# Patient Record
Sex: Female | Born: 1940 | Race: White | Hispanic: No | Marital: Married | Smoking: Never smoker
Health system: Southern US, Community
[De-identification: ages and names within clinical notes are randomized; demographics above are authoritative.]

## PROBLEM LIST (undated history)

## (undated) DIAGNOSIS — I219 Acute myocardial infarction, unspecified: Secondary | ICD-10-CM

## (undated) DIAGNOSIS — E785 Hyperlipidemia, unspecified: Secondary | ICD-10-CM

## (undated) DIAGNOSIS — R519 Headache, unspecified: Secondary | ICD-10-CM

## (undated) DIAGNOSIS — K219 Gastro-esophageal reflux disease without esophagitis: Secondary | ICD-10-CM

## (undated) DIAGNOSIS — Z8719 Personal history of other diseases of the digestive system: Secondary | ICD-10-CM

## (undated) DIAGNOSIS — R51 Headache: Secondary | ICD-10-CM

## (undated) DIAGNOSIS — I639 Cerebral infarction, unspecified: Secondary | ICD-10-CM

## (undated) DIAGNOSIS — I2510 Atherosclerotic heart disease of native coronary artery without angina pectoris: Secondary | ICD-10-CM

## (undated) HISTORY — DX: Atherosclerotic heart disease of native coronary artery without angina pectoris: I25.10

## (undated) HISTORY — DX: Hyperlipidemia, unspecified: E78.5

---

## 1997-09-23 ENCOUNTER — Emergency Department (HOSPITAL_COMMUNITY): Admission: EM | Admit: 1997-09-23 | Discharge: 1997-09-23 | Payer: Self-pay | Admitting: Emergency Medicine

## 1999-01-26 HISTORY — PX: CORONARY ARTERY BYPASS GRAFT: SHX141

## 1999-01-26 HISTORY — PX: CARDIAC CATHETERIZATION: SHX172

## 1999-12-26 DIAGNOSIS — I219 Acute myocardial infarction, unspecified: Secondary | ICD-10-CM

## 1999-12-26 HISTORY — DX: Acute myocardial infarction, unspecified: I21.9

## 2000-01-13 ENCOUNTER — Encounter: Payer: Self-pay | Admitting: Emergency Medicine

## 2000-01-13 ENCOUNTER — Inpatient Hospital Stay (HOSPITAL_COMMUNITY): Admission: EM | Admit: 2000-01-13 | Discharge: 2000-01-22 | Payer: Self-pay | Admitting: Emergency Medicine

## 2000-01-15 ENCOUNTER — Encounter: Payer: Self-pay | Admitting: Thoracic Surgery (Cardiothoracic Vascular Surgery)

## 2000-01-16 ENCOUNTER — Encounter: Payer: Self-pay | Admitting: Thoracic Surgery (Cardiothoracic Vascular Surgery)

## 2000-01-17 ENCOUNTER — Encounter: Payer: Self-pay | Admitting: Thoracic Surgery (Cardiothoracic Vascular Surgery)

## 2000-01-18 ENCOUNTER — Encounter: Payer: Self-pay | Admitting: Thoracic Surgery (Cardiothoracic Vascular Surgery)

## 2000-01-20 ENCOUNTER — Encounter: Payer: Self-pay | Admitting: Thoracic Surgery (Cardiothoracic Vascular Surgery)

## 2000-02-16 ENCOUNTER — Encounter (HOSPITAL_COMMUNITY): Admission: RE | Admit: 2000-02-16 | Discharge: 2000-04-29 | Payer: Self-pay | Admitting: Interventional Cardiology

## 2005-05-26 ENCOUNTER — Encounter: Payer: Self-pay | Admitting: Neurosurgery

## 2008-05-30 ENCOUNTER — Emergency Department (HOSPITAL_COMMUNITY): Admission: EM | Admit: 2008-05-30 | Discharge: 2008-05-30 | Payer: Self-pay | Admitting: Emergency Medicine

## 2010-04-09 ENCOUNTER — Inpatient Hospital Stay (INDEPENDENT_AMBULATORY_CARE_PROVIDER_SITE_OTHER)
Admission: RE | Admit: 2010-04-09 | Discharge: 2010-04-09 | Disposition: A | Discharge status: Home / Self Care | Payer: Medicare PPO | Source: Ambulatory Visit | Attending: Family Medicine | Admitting: Family Medicine

## 2010-04-09 ENCOUNTER — Ambulatory Visit (INDEPENDENT_AMBULATORY_CARE_PROVIDER_SITE_OTHER): Payer: Medicare PPO

## 2010-04-09 DIAGNOSIS — S42409A Unspecified fracture of lower end of unspecified humerus, initial encounter for closed fracture: Secondary | ICD-10-CM

## 2010-04-11 ENCOUNTER — Emergency Department (HOSPITAL_COMMUNITY): Payer: Medicare PPO

## 2010-04-11 ENCOUNTER — Ambulatory Visit (INDEPENDENT_AMBULATORY_CARE_PROVIDER_SITE_OTHER): Payer: Medicare PPO

## 2010-04-11 ENCOUNTER — Emergency Department (HOSPITAL_COMMUNITY)
Admission: EM | Admit: 2010-04-11 | Discharge: 2010-04-11 | Disposition: A | Discharge status: Home / Self Care | Payer: Medicare PPO | Attending: Emergency Medicine | Admitting: Emergency Medicine

## 2010-04-11 ENCOUNTER — Inpatient Hospital Stay (INDEPENDENT_AMBULATORY_CARE_PROVIDER_SITE_OTHER)
Admission: RE | Admit: 2010-04-11 | Discharge: 2010-04-11 | Disposition: A | Discharge status: Home / Self Care | Payer: Medicare PPO | Source: Ambulatory Visit | Attending: Family Medicine | Admitting: Family Medicine

## 2010-04-11 DIAGNOSIS — Z79899 Other long term (current) drug therapy: Secondary | ICD-10-CM | POA: Insufficient documentation

## 2010-04-11 DIAGNOSIS — M25419 Effusion, unspecified shoulder: Secondary | ICD-10-CM | POA: Insufficient documentation

## 2010-04-11 DIAGNOSIS — W11XXXA Fall on and from ladder, initial encounter: Secondary | ICD-10-CM | POA: Insufficient documentation

## 2010-04-11 DIAGNOSIS — S42213A Unspecified displaced fracture of surgical neck of unspecified humerus, initial encounter for closed fracture: Secondary | ICD-10-CM | POA: Insufficient documentation

## 2010-04-11 DIAGNOSIS — I2510 Atherosclerotic heart disease of native coronary artery without angina pectoris: Secondary | ICD-10-CM | POA: Insufficient documentation

## 2010-04-11 DIAGNOSIS — M25519 Pain in unspecified shoulder: Secondary | ICD-10-CM | POA: Insufficient documentation

## 2010-04-11 DIAGNOSIS — S42209B Unspecified fracture of upper end of unspecified humerus, initial encounter for open fracture: Secondary | ICD-10-CM

## 2010-04-11 LAB — DIFFERENTIAL
Eosinophils Absolute: 0 10*3/uL (ref 0.0–0.7)
Eosinophils Relative: 0 % (ref 0–5)
Lymphocytes Relative: 23 % (ref 12–46)
Lymphs Abs: 2.1 10*3/uL (ref 0.7–4.0)
Monocytes Absolute: 0.6 10*3/uL (ref 0.1–1.0)
Monocytes Relative: 7 % (ref 3–12)

## 2010-04-11 LAB — BASIC METABOLIC PANEL
BUN: 13 mg/dL (ref 6–23)
Chloride: 99 mEq/L (ref 96–112)
Creatinine, Ser: 0.59 mg/dL (ref 0.4–1.2)
GFR calc Af Amer: >60 mL/min (ref >60)
GFR calc non Af Amer: >60 mL/min (ref >60)
Potassium: 3.6 mEq/L (ref 3.5–5.1)

## 2010-04-11 LAB — CBC
HCT: 36 % (ref 36.0–46.0)
MCH: 30.3 pg (ref 26.0–34.0)
MCV: 89.6 fL (ref 78.0–100.0)
Platelets: 205 10*3/uL (ref 150–400)
RDW: 13.4 % (ref 11.5–15.5)

## 2010-04-11 LAB — POCT CARDIAC MARKERS
CKMB, poc: 1.5 ng/mL (ref 1.0–8.0)
Troponin i, poc: <0.05 ng/mL (ref 0.00–0.09)

## 2010-04-11 LAB — APTT: aPTT: 27 seconds (ref 24–37)

## 2010-06-12 NOTE — Op Note (Signed)
[place]. [facility]  Patient:    Debra Fry, Debra Fry                         MRN: [id number] Proc. Date: 01/15/00 Adm. Date:  [id number] Attending:  Charlett Lango CC:         Darci Needle, M.D.   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  POSTOPERATIVE DIAGNOSIS:  Severe two vessel coronary disease status post non-Q wave myocardial infarction.  OPERATION:  Median sternotomy, extracorporal circulation, coronary artery bypass grafting x 2 (left internal mammary to second obtuse marginal, right internal mammary artery to distal right coronary artery).  SURGEON:  Salvatore Decent. Dorris Fetch, M.D.  ASSISTANT: Sherrie George, P.A.  ANESTHESIA:  General.  FINDINGS:  Good quality targets, good quality conduits, normal left ventricular size and function.  INDICATIONS:  Ms. Harnack is a 70 year old female who presented with unstable angina and ruled in for a myocardial infarction.  She underwent cardiac catheterization which revealed 99% stenosis in her mid RCA.  She also had a long 80% stenosis at the ostium of her left circumflex which was not amenable to angioplasty.  The patient was therefore referred for coronary artery bypass grafting.  The indications, risks, benefits and alternatives were discussed in detail with the patient.  She understood and accepted the risks and agreed to proceed.  DESCRIPTION OF PROCEDURE:  The patient was taken to the preoperative holding area on January 15, 2000.  Lines were placed to monitor arterial, central, venous, pulmonary and arterial pressure.  Electrocardiogram leads were placed for continuous telemetry.  The patient was taken to the operating room, anesthetized and intubated.  A Foley catheter was placed.  Intravenous antibiotics were administered.  The chest, abdomen and legs were prepped and draped in the usual fashion.  A medium  sternotomy was performed, and the internal mammary arteries were taken down using standard techniques.  Large branches were doubly clipped and divided with scissors.  Small branches were clipped on the mammary side and cauterized on the chest wall side.  The patient was given 5000 units before dividing the first mammary artery and the remainder of the full heparin dose before dividing the second.  Both mammary arteries were 2 mm in diameter and both had excellent flow.  The pericardium was opened.  The ascending aorta was inspected.  There was no palpable atherosclerotic disease.  It was of normal size.  The aorta was cannulated via concentric 2-0 Ethibond nonpledgeted pursestring sutures.  A dual stage venous cannula was placed via pursestring suture in the right atrial appendage.  Cardiopulmonary bypass was instituted and the patient was cooled to 32 degrees Celsius.  The coronary arteries were inspected and anastomotic sites were chosen.  The conduits were inspected and cut to length. A foam pad was placed in the pericardium to protect the phrenic nerve. A temperature probe was placed in the myocardial septum, and a cardioplegia cannula was placed in the ascending aorta.  The aorta was cross-clamped.  The left ventricle was emptied the aortic root and then cardiac arrest was achieved with a combination of topical iced saline and cold antegrade cardioplegia.  After achieving a complete diastolic arrest and the myocardial temperature was less than 10 degrees Celsius, the following distal anastomosis were performed.  First, the left internal mammary artery was brought through a window in the pericardium.  The distal end of  the mammary was spatulated.  It was anastomosis end-to-side to the obtuse marginal II with just the largest branch of the left circumflex coronary artery which had an 80-85% long proximal ostial stenosis.  The obtuse marginal II was a good quality target.  The mammary  was a good quality conduit.  The anastomosis was performed end-to-side with a running 8-0 Prolene suture.  At the completion of the anastomosis, the pedicle clamp was briefly removed to inspect for hemostasis.  There was good hemostasis at the anastomosis.  The bulldog pedicle was replaced.  The mammary pedicle was tacked to the epicardial surface of the heart with 6-0 Prolene sutures.  Of note, the mammary reached easily and without tension to OM-II but was grafted relatively high in the AV groove because it became very small further out.  Next, additional cardioplegia was administered down the aortic root once again.  Then, the right internal mammary artery was brought through a window on the right side of the pericardium.  The distal end of the right mammary was spatulated and was anastomosis end-to-side to the distal right coronary.  The distal right coronary was a 1.8 mm good quality target.  The right mammary was a 2 mm good quality conduit.  The anastomosis was performed end-to-side with a running 8-0 Prolene suture.  At the completion of the anastomosis, the bulldog clamp was removed.  The anastomosis had been probed proximally and distally with a 1.5 mm probe prior to tying the suture.  At the completion of the anastomosis, the bulldog clamp was removed.  A good flash of blood was seen distally in the right coronary.  There was some bleeding from the toe which was repaired with a single 8-0 Prolene suture.  The mammary pedicle was tacked to the epicardial surface of the heart.  The aortic cross-clamp was removed, and the total cross-clamp time was 33 minutes.  Lidocaine was administered, and the patient resumed sinus rhythm spontaneously.  No defibrillations were required.  The distal anastomoses were inspected for hemostasis.  The patient was being rewarmed during the performance of the RIMA to RCA anastomosis.  The epicardial pacing wires were placed on the right atrium and the  right ventricle.  The patient was weaned from cardiopulmonary bypass when the core  temperature reached 37 degrees Celsius.  She was atrially paced for rate at the time of separation from bypass.  The total bypass time was 71 minutes. The patient did require two units of packed red blood cells while on bypass for anemia.  The initial cardiac index was greater than 2 liters per minute per m sq.  The test dose of Protamine was administered without hemodynamic compromise.  The atrial and aortic cannulae were removed.  There was good hemostasis at both cannulation sites.  The cardioplegia needle was removed, and the suture pulled through the aorta in one spot requiring a second 4-0 Prolene suture to achieve initial hemostasis and a 4-0 Prolene horizontal mattress suture with pericardial pledgets to reinforce the repair.  There was good hemostasis with this site as well.  The remainder of the Protamine was administered without incident, and an additional dose of antibiotics was given.  The chest was irrigated with one liter of warm normal saline.  Hemostasis was achieved. Bilateral pleural and two mediastinal chest tubes were placed through separate subcostal incisions.  The sternum was closed with heavy gauged interrupted stainless steel wires.  The pectoralis fascia was closed with a running #1 Vicryl suture.  The subcutaneous tissue was closed with a running 2-0 Vicryl suture, and the skin was closed with a 3-0 Vicryl subcuticular suture.  All sponge, needle and instrument counts were correct at the end of the procedure. There were no intraoperative complications.  The patient was taken from the operating room to the surgical intensive care unit, intubated and in stable condition. DD:  01/15/00 TD:  01/17/00 Job: [id number]

## 2010-06-12 NOTE — H&P (Signed)
[place]. [facility]  Patient:    Debra Fry, Debra Fry                         MRN: [id number] Adm. Date:  [id number] Attending:  Lyn Records. Iii Dictator:   Anselm Lis, N.P. CC:         Carola J. Gerri Spore, M.D., [facility]   History and Physical  DATE OF BIRTH: February 05, 1940  CHIEF COMPLAINT: [name] is a pleasant 70 year old female without prior cardiac history or significant risk factors for coronary artery disease.  She does has a history of ulcers and of hiatal hernia with postprandial regurgitation for many years.  She had a stress Cardiolite study done in 1996 by Dr. Nicki Guadalajara for atypical chest pain which was negative for ischemia, with a good ejection fraction.  Four days prior to admission the patient developed malaise, two days earlier having developed left subscapular sharp "knife-like" discomfort that was almost continuous and described as severe, with radiation to her left anterior chest and associated with left arm aching. She had no associated diaphoresis, shortness of breath, or nausea.  The pain seemed to markedly exacerbate with ambulating/exertion.  It was severe when walking at the mall yesterday.  The discomfort was relieved when lying perfectly still.  She can have precipitation of this with leaning forward. The discomfort is nonpleuritic and nonmusculoskeletal.   She reported to [facility] for further evaluation, where EKG revealed new (compared to EKG obtained in 1996) inferior T wave inversions.  She was triaged at the [facility]. [facility] and ambulating to the stretcher again developed severe left subscapular pain with radiation to her left anterior chest and with left arm aching.  She also felt dizzy. Currently she is pain-free.  EKG reveals continued inferior T wave abnormalities with lateral T wave flattening.  CARDIAC RISK FACTORS: Age.  PAST MEDICAL  HISTORY:  1. GERD.  2. Hiatal hernia with postprandial reflux, with prior extensive GI work-up     in the past per the patients family.  3. Peptic ulcer disease.  PAST SURGICAL HISTORY: Negative.  ALLERGIES: No known drug allergies.  MEDICATIONS: Prevacid, uncertain dosage, q.d.  SOCIAL HISTORY: Negative tobacco and ETOH use.  Caffeine use not excessive. The patient has been married for 30+ years and has two daughters and a son, who are alive and well.  She has worked at a [employer], which is going out of business this Friday.  She worked there for 31 years and has been very distressed and depressed over the last three months about this job coming to an end.  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  FAMILY HISTORY: Her father died of a heart attack late in life.  Her mother died at the age of 97 and had diabetes mellitus and peripheral vascular disease.  REVIEW OF SYSTEMS: The patient denies dysphagia but problems with GERD manifested as burning, without significant relief on Proton pump inhibitors. She has had problems with constipation.  She denies melena and diarrhea. Negative history for bright red blood per rectum.  She denies dysuria or hematuria.  Negative pedal edema, orthopnea, or PND.  Rare nonsymptomatic palpitations.  She has complained of abdominal swelling and bloating over the last year, which is quite concerning to her.  Again, she has postprandial regurgitation/emesis occurring at least once daily.  She has been told she needs surgery  for this is recalcitrant because of probable side effect of continuous bloating.  PHYSICAL EXAMINATION (as performed by Dr. Katrinka Blazing):  VITAL SIGNS: Blood pressure 155/82, heart rate 62 and regular.  She is afebrile.  GENERAL: She is a well-nourished middle-aged female in no acute distress.  Her husband and daughter are at her bedside.  HEENT: There are bilateral carotid upstrokes without bruits.  NECK: No  JVD, no thyromegaly.  CHEST: Lung sounds clear with equal bilateral excursions.  CARDIAC: Regular rate and rhythm without murmurs, rubs, or gallops.  Normal S1 and S2.  ABDOMEN: Soft, nondistended.  Normoactive bowel sounds.  Negative abdominal aortic, renal, or femoral bruit.  No masses, no organomegaly appreciated.  EXTREMITIES: Symmetrical bilateral pulses +2/4, negative pedal edema.  NEUROLOGIC: Cranial nerves 2-12 grossly intact.  Nonfocal.  Alert and oriented x 3.  GU/RECTAL: Examination deferred.  LABORATORY DATA: EKG from the primary physicians office revealed normal sinus rhythm at 78 beats per minute with T wave inversion inferiorly, T wave flattening in V3 through V6.  Follow-up EKG in the ER revealed improvement in T wave abnormalities.  Cardiolite study from 1996 revealed normal perfusion, normal dynamics.  WBC 6.3, hemoglobin 13.7, hematocrit 37.3, platelets 257,000.  Sodium 136, potassium 3.8, chloride 104, CO2 25, glucose 109, BUN 17, creatinine 0.8. LFTs were within normal range.  CK 202 with MB fraction pending.  Troponin I is pending.  IMPRESSION (as dictated by Dr. Katrinka Blazing):  1. Chest pain, with some typical/atypical features for coronary artery     disease as etiology.  Inferior T wave abnormalities earlier today have     improved but are new compared to the electrocardiogram done in 1996.  2. Gastroesophageal reflux disease/postprandial reflux, on Prevacid.  Per     the patient and her husband she has had extensive gastrointestinal     work-up in the past.  PLAN (as dictated per Dr. Katrinka Blazing):  1. Admit to telemetry for rule out MI protocol with serial cardiac enzymes     daily and EKGs.  Question cardiac catheterization.  2. Continue Proton pump inhibitor.  3. SubQ Lovenox, aspirin, and nitrates. DD:  01/13/00 TD:  01/14/00 Job: [id number]

## 2010-06-12 NOTE — Cardiovascular Report (Signed)
[place]. [facility]  Patient:    Debra Fry, Debra Fry                         MRN: [id number] Proc. Date: 01/14/00 Adm. Date:  [id number] Attending:  Charlett Lango CC:         Carola J. Gerri Spore, M.D., [facility]  Ulyess Mort, M.D. [facility]  CVTS   Cardiac Catheterization  DATE OF BIRTH: [birth year]  INDICATIONS FOR PROCEDURE: Unstable angina/non-Q-wave myocardial infarction. PROCEDURES PERFORMED: 1. Left heart catheterization. 2. Selective coronary angiography. 3. Left ventriculography.  DESCRIPTION OF PROCEDURE: After informed consent, a 6 French sheath was placed in the right femoral artery using a modified Seldinger technique.  A 6 French A2 multipurpose catheter was used for hemodynamic recordings, left ventriculography, and selective right and left coronary angiography.  A #4 right Judkins catheter, 6 [place] was also used for right coronary angiography. The patient tolerated the procedure without complications.  After reviewing the digital images in the catheterization suite, it was felt that coronary artery bypass surgery would be the preferable treatment rather than PCI, because of high-grade stenosis in the ostium of the circumflex of the left main, that would preclude good percutaneous management.  The right coronary was then culprit vessel and was intimately treatable with PCI, but this would only be a partial solution to her coronary disease, anatomical problems at this time.  In the holding area, before the sheath was pulled the patient had a bradyasystolic arrest that responded to a chest thump, coughing, and the administration of atropine.  Very brief chest compressions were necessary. This all occurred in the setting of significant nausea and ultimately vomiting.  The patient responded briskly to the atropine and the sheath was later removed.  RESULTS:  I:  HEMODYNAMIC DATA:     a. The aortic pressure  was 122/62 mmHg.     b. Left ventricular pressure 126/20 mmHg.  II:  LEFT VENTRICULOGRAPHY:  The left ventricle is normal in size and demonstrates normal overall contractility.  EF greater than 60%.  III:  SELECTIVE CORONARY ANGIOGRAPHY:     a. Left main: The left main coronary artery is normal.     b. Left anterior descending coronary artery: The LAD is a large        vessel that reaches the left ventricular apex.  It gives origin to        two large diagonal branches that unfortunately, during the procedure,        were never laid out in the LAO cranial and AP cranial views.        There dose not appear to be any significant obstruction in the LAD,        although there is mild irregularity in the proximal LAD before the        first septal perforator.  Both the large diagonal branches are widely        patent.    c. Circumflex artery: The circumflex artery is a large vessel that gives        origin to two large obtuse marginal branches and contains an        ostial 85% stenosis right at the left main that is segmental.     d. Right coronary artery: The right coronary artery is a dominant vessel        giving origin to a large PDA and two left ventricular branches.  There  is a 50% ostial narrowing in the right coronary and there is 99%        stenosis in the mid RCA. Flow in the RCA is TIMI grade 3.  CONCLUSIONS: 1. Severe two-vessel coronary artery disease with an ostial circumflex and    a severe thrombus containing mid right coronary stenosis.  The right    coronary is the culprit vessel.  The circumflex, however, presents problems    for percutaneous therapy, and because its proximity to the left main, it    would appear that bypass surgery would be her most appropriate treatment. 2. Left ventricular function is normal. 3. Bradyasystolic arrest in the recovery area, treated with atropine and    precordial thump.  PLAN: Continue Integrilin.  CVTS to see, hopeful surgery  in the next 24 hours or so.  Will be happy to re-angiogram the left coronary if there are any issues about the patency of this vessel, although I do not feel that any significant stenosis is present. DD:  01/14/00 TD:  01/15/00 Job: [id number]

## 2010-06-12 NOTE — Discharge Summary (Signed)
[place]  Patient:    Debra Fry, Debra Fry                         MRN: [id number] Adm. Date:  [id number] Disc. Date: 01/22/00 Attending:  Charlett Lango Dictator:   Sherrie George, P.A. CC:         Carola J. Gerri Spore, M.D. [facility]  Celso Sickle, M.D.   Discharge Summary  DATE OF BIRTH:  July 17, 1940  ADMISSION DIAGNOSES: 1. Atypical chest pain. 2. Gastroesophageal reflux disease.  DISCHARGE DIAGNOSES: 1. Unstable angina with non-Q-wave myocardial infarction, ejection fraction    of 65%, with severe two-vessel coronary artery disease. 2. Status post bradycardic arrest during cardiac catheterization. 3. Postoperative anemia. 4. Gastroesophageal reflux disease.  PROCEDURE: 1. Cardiac catheterization on January 14, 2000, Dr. Darci Needle III. 2. Coronary artery bypass grafting x 2, with the left internal mammary    artery to the second obtuse marginal, and right internal mammary artery    to the right coronary artery on January 15, 2000, Dr. Viviann Spare C.    Hendrickson.  HISTORY OF PRESENT ILLNESS:  The patient is a 70 year old white female, a medical patient of Dr. Otilio Connors. 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  The patient is a 70 year old white female, who was admitted with a prior history of atypical chest pain.  She underwent a Cardiolyte in 1996, which was negative.  Four days prior to admission she developed malaise.  Two days earlier she had developed some left subscapular sharp knife-like discomfort that was almost continuous, and described as severe, with radiation to her left anterior chest, associated with left arm aching.  She had no associated diaphoresis, shortness of breath, or nausea.  The pain seems to markedly exacerbate with ambulating and exertion.  It was severe when walking at the mall yesterday.  The discomfort is resolved by lying still.   She subsequently underwent an evaluation at Dr. Rhona Leavens office.  An electrocardiogram revealed new changes, compared to an electrocardiogram done in 1996, with inferior T-wave inversions.  She was sent to the [facility] Emergency Room, and at this time she was admitted for further treatment, rule out an myocardial infarction.  PAST MEDICAL HISTORY: 1. Gastroesophageal reflux disease. 2. Hiatal hernia with postprandial reflux, with prior extensive GI workup. 3. History of peptic ulcer disease.  ALLERGIES:  No known drug allergies.  CURRENT MEDICATIONS:  Prevacid, dose uncertain.  SOCIAL HISTORY:  She has never used tobacco or alcohol.  She does not use excessive caffeine.  She is married for 30 years.  Her husband is a heavy smoker.  She has two daughters and a son.  For further history and physical, please see the dictated note.  HOSPITAL COURSE:  The patient was admitted and underwent routine studies, including CPK, MB, and troponins, which were positive.  After reviewing the studies, she was placed on IV heparin and IV Integrilin and IV nitroglycerin. She remained stable, and was taken to the cardiac catheterization laboratory on January 14, 2000, by Dr. Darci Needle III.  This showed a normal ejection fraction of 65%.  The coronary arteriogram showed a normal left main and LAD.  The circumflex had an 80% ostial stenosis, and the right coronary artery had a 90%+ midstenosis, which was thought to be the patients culprit lesion.  Additionally during this procedure, she developed a brady asystolic arrest, and  was treated with a chest pump and IV atropine in the recovery area.  After the completion of the study, it was Dr. Lonn Georgia opinion that she had severe two-vessel disease, and it was not treatable with percutaneous treatments because of the proximity to the left knee, and the inability to stent it.  They felt that the RCA was the culprit lesion, and she  had a normal ejection fraction.  It was their recommendation that she undergo a coronary artery bypass grafting.  She was seen in consultation by Dr. Viviann Spare C. Dorris Fetch, who after reviewing the studies in the chart agrees.  She has severe two-vessel disease.  The left circumflex was not amenable to a PTCA. She had unstable angina, with a brady systolic arrest.  He recommended a coronary artery bypass grafting.  The risks and benefits were discussed.  The patient was agreeable, and was taken to the operating room on the following day on January 15, 2000.  At this time she underwent a coronary artery bypass grafting x 2, with the left internal mammary artery to the second obtuse marginal, and the right internal mammary to the right coronary artery.  She tolerated this well, and returned to the intensive care unit in satisfactory condition.  LABORATORY DATA:  Postoperatively the cardiac index was 2.3.  The platelets were a little low, but overall the patient remained stable.  On the first postoperative morning she was lethargic, but saturations were 100% on 2 L nasal cannula.  The blood pressure was 100/57.  The cardiac index was 3.5. Hemoglobin 9, hematocrit 27, white count 9.6, platelets 142,000.  Electrolytes were normal.  BUN 12, creatinine 0.7, glucose 189.  It was Dr. Viviann Spare C. Hendricksons opinion that overall she was doing well, and stable from the cardiac standpoint.  She was diuresed and a pulmonary toilette was initiated.  She continued to do well throughout the day.  On the second postoperative day she was afebrile.  The 2 L nasal cannula showed the saturations of 94%.  The output was good.  The patient had minimal drainage from her chest tubes.  The pleural tubes were removed.  We continued the diuresis and a pulmonary toilette.  The patient was then transferred to 2000. On the third postoperative day, the patient was stable.  She had a right effusion and some atelectasis,  and was requiring O2.  Cardiac rehabilitation  was initiated, and the patient remained in essentially a sinus rhythm since her surgery.  She has continued to do well.  Her biggest problem has been O2 dependence.  By January 21, 2000, the patients saturations were improving.  After walking her saturations did drop down to 82%, but after resting for one to two minutes her saturations came up to 92% on room air.  We plan to continue her diuresis.  A chest x-ray showed some small effusions.  We will work on those. The patient has remained in a sinus rhythm.  DISCHARGE MEDICATIONS: 1. Resume her Prevacid as before. 2. Lopressor 50 mg 1/2 tablet q.12h. 3. Lasix 40 mg p.o. q.d. x 10 days. 4. Potassium chloride 20 mEq p.o. q.d. x 10 days. 5. Zocor 20 mg p.o. q.h.s. 6. Tylox one to two p.o. q.4h. p.r.n. 7. It is also recommended that the patient start on a multivitamin with iron    one q.d.  FOLLOWUP:  The patient will return in two weeks to see Dr. Katrinka Blazing.  She is to contact Dr. Katrinka Blazing.  She is to return in  three weeks to see Dr. Dorris Fetch on Wednesday, February 10, 2000, at 11:30 a.m.  She is to contact Dr. Gerri Spore for followup in the office.  DISCHARGE  INSTRUCTIONS:  The discharge activity is to be light to moderate. No lifting of over 10 pounds, no driving.  No strenuous activity.  DISCHARGE LABORATORY DATA:  White count 8.6, hematocrit 24.7, platelets 191,000, white count 8.8.  Electrolytes shows sodium 134, potassium 3.9, chloride 100, CO2 of 30, BUN 23, creatinine 0.7, glucose 158.  The patient has had some elevated glucose during her hospitalization.  These have not been specifically addressed.  CONDITION ON DISCHARGE:  Improved. DD:  01/21/00 TD:  01/21/00 Job: [id number]

## 2011-02-26 ENCOUNTER — Other Ambulatory Visit: Payer: Self-pay | Admitting: Internal Medicine

## 2011-02-26 DIAGNOSIS — Z1231 Encounter for screening mammogram for malignant neoplasm of breast: Secondary | ICD-10-CM

## 2011-02-26 DIAGNOSIS — M8580 Other specified disorders of bone density and structure, unspecified site: Secondary | ICD-10-CM

## 2011-03-24 ENCOUNTER — Ambulatory Visit
Admission: RE | Admit: 2011-03-24 | Discharge: 2011-03-24 | Disposition: A | Discharge status: Home / Self Care | Payer: Medicare PPO | Source: Ambulatory Visit | Attending: Internal Medicine | Admitting: Internal Medicine

## 2011-03-24 DIAGNOSIS — Z1231 Encounter for screening mammogram for malignant neoplasm of breast: Secondary | ICD-10-CM

## 2011-03-24 DIAGNOSIS — M8580 Other specified disorders of bone density and structure, unspecified site: Secondary | ICD-10-CM

## 2013-09-29 ENCOUNTER — Encounter: Payer: Self-pay | Admitting: *Deleted

## 2014-08-25 ENCOUNTER — Emergency Department (HOSPITAL_COMMUNITY)
Admission: EM | Admit: 2014-08-25 | Discharge: 2014-08-25 | Disposition: A | Discharge status: Home / Self Care | Payer: No Typology Code available for payment source | Attending: Emergency Medicine | Admitting: Emergency Medicine

## 2014-08-25 ENCOUNTER — Emergency Department (HOSPITAL_COMMUNITY): Payer: No Typology Code available for payment source

## 2014-08-25 ENCOUNTER — Encounter (HOSPITAL_COMMUNITY): Payer: Self-pay | Admitting: *Deleted

## 2014-08-25 DIAGNOSIS — S4992XA Unspecified injury of left shoulder and upper arm, initial encounter: Secondary | ICD-10-CM | POA: Diagnosis not present

## 2014-08-25 DIAGNOSIS — Y92410 Unspecified street and highway as the place of occurrence of the external cause: Secondary | ICD-10-CM | POA: Diagnosis not present

## 2014-08-25 DIAGNOSIS — S134XXA Sprain of ligaments of cervical spine, initial encounter: Secondary | ICD-10-CM | POA: Diagnosis not present

## 2014-08-25 DIAGNOSIS — I2510 Atherosclerotic heart disease of native coronary artery without angina pectoris: Secondary | ICD-10-CM | POA: Diagnosis not present

## 2014-08-25 DIAGNOSIS — Y998 Other external cause status: Secondary | ICD-10-CM | POA: Diagnosis not present

## 2014-08-25 DIAGNOSIS — S199XXA Unspecified injury of neck, initial encounter: Secondary | ICD-10-CM | POA: Diagnosis present

## 2014-08-25 DIAGNOSIS — Y9389 Activity, other specified: Secondary | ICD-10-CM | POA: Diagnosis not present

## 2014-08-25 DIAGNOSIS — Z8639 Personal history of other endocrine, nutritional and metabolic disease: Secondary | ICD-10-CM | POA: Insufficient documentation

## 2014-08-25 DIAGNOSIS — Z9861 Coronary angioplasty status: Secondary | ICD-10-CM | POA: Diagnosis not present

## 2014-08-25 DIAGNOSIS — S39012A Strain of muscle, fascia and tendon of lower back, initial encounter: Secondary | ICD-10-CM

## 2014-08-25 DIAGNOSIS — S139XXA Sprain of joints and ligaments of unspecified parts of neck, initial encounter: Secondary | ICD-10-CM

## 2014-08-25 MED ORDER — METHOCARBAMOL 500 MG PO TABS: 500.0000 mg | ORAL_TABLET | Freq: Two times a day (BID) | ORAL | Status: DC

## 2014-08-25 MED ORDER — NAPROXEN 500 MG PO TABS: 500.0000 mg | ORAL_TABLET | Freq: Two times a day (BID) | ORAL | Status: DC
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
## 2015-11-01 ENCOUNTER — Ambulatory Visit (HOSPITAL_COMMUNITY)
Admission: EM | Admit: 2015-11-01 | Discharge: 2015-11-01 | Disposition: A | Discharge status: Home / Self Care | Payer: Medicare HMO | Attending: Internal Medicine | Admitting: Internal Medicine

## 2015-11-01 ENCOUNTER — Encounter (HOSPITAL_COMMUNITY): Payer: Self-pay | Admitting: Emergency Medicine

## 2015-11-01 DIAGNOSIS — M5442 Lumbago with sciatica, left side: Secondary | ICD-10-CM | POA: Diagnosis not present

## 2015-11-01 MED ORDER — NAPROXEN 500 MG PO TABS: 500.0000 mg | ORAL_TABLET | Freq: Two times a day (BID) | ORAL | 0 refills | Status: DC

## 2015-11-01 MED ORDER — PREDNISONE 50 MG PO TABS: 50.0000 mg | ORAL_TABLET | Freq: Every day | ORAL | 0 refills | Status: AC

## 2015-11-01 NOTE — Discharge Instructions (Addendum)
Activity as tolerated.  Prescriptions for prednisone (steroid) and naproxen (anti inflammatory) were sent to the [place] at [facility].  Ice on your low back for a few minutes a couple times daily may be helpful in decreasing pain/inflammation.   If pain in back and down left leg persists, please followup with a spine specialist for further evaluation.

## 2015-11-01 NOTE — ED Triage Notes (Signed)
Patient complains of left leg, hip, and lower back pain.  No known injury.  Pain for 2 weeks.

## 2015-11-01 NOTE — ED Provider Notes (Signed)
[facility]    CSN: [id number] Arrival date & time: 11/01/15  1609     History   Chief Complaint Pain--leg  HPI Debra Fry is a 75 y.o. female. She presents today with a two-week history of pain radiating down the left lateral aspect of her leg. She has some discomfort in her left upper buttock as well. No leg weakness/clumsiness, no change in bowel or bladder function. Tends to be a little bit constipated.  Pain is more noticeable when she is moving around, cleaning, and also wakes her at night. No falls. She was able to walk into the urgent care independently.  HPI  Past Medical History:  Diagnosis Date  . Coronary artery disease   . Hyperlipidemia     Past Surgical History:  Procedure Laterality Date  . CORONARY ARTERY BYPASS GRAFT       Home Medications    Prior to Admission medications   Medication Sig Start Date End Date Taking? Authorizing Provider  naproxen (NAPROSYN) 500 MG tablet Take 1 tablet (500 mg total) by mouth 2 (two) times daily. 11/01/15   Sherlene Shams, MD  predniSONE (DELTASONE) 50 MG tablet Take 1 tablet (50 mg total) by mouth daily. 11/01/15 11/06/15  Sherlene Shams, MD  Tetrahydrozoline HCl (VISINE OP) Apply 1-2 drops to eye daily as needed (dry eyes).    Historical Provider, MD    Family History Family History  Problem Relation Age of Onset  . Family history unknown: Yes    Social History Social History  Substance Use Topics  . Smoking status: Never Smoker  . Smokeless tobacco: Not on file  . Alcohol use No     Allergies   Review of patient's allergies indicates no known allergies.   Review of Systems Review of Systems  All other systems reviewed and are negative.    Physical Exam Triage Vital Signs ED Triage Vitals [11/01/15 1756]  Enc Vitals Group     BP 157/75     Pulse Rate 70     Resp 18     Temp 98 F (36.7 C)     Temp Source Oral     SpO2 99 %     Weight 138 lb (62.6 kg)     Height 5\' 2"  (1.575 m)     Updated Vital Signs BP 157/75 (BP Location: Left Arm)   Pulse 70   Temp 98 F (36.7 C) (Oral)   Resp 18   Ht 5\' 2"  (1.575 m)   Wt 138 lb (62.6 kg)   SpO2 99%   BMI 25.24 kg/m  Physical Exam  Constitutional: She is oriented to person, place, and time. No distress.  Alert, nicely groomed  HENT:  Head: Atraumatic.  Eyes:  Conjugate gaze, no eye redness/drainage  Neck: Neck supple.  Cardiovascular: Normal rate.   Pulmonary/Chest: No respiratory distress.  Abdominal: She exhibits no distension.  Musculoskeletal: Normal range of motion.  No leg swelling No tenderness to palpation in the low back, no focal swelling Strength in the bilateral lower extremities, proximal and distal, is 5/5 and symmetric Moves legs easily  Neurological: She is alert and oriented to person, place, and time.  Able to walk into the urgent care independently, climb on/off the exam table without difficulty  Skin: Skin is warm and dry.  No cyanosis  Nursing note and vitals reviewed.    UC Treatments / Results   Procedures Procedures (including critical care time)  None today  Final Clinical Impressions(s) / UC Diagnoses   Final diagnoses:  Acute left-sided low back pain with left-sided sciatica   Activity as tolerated.  Prescriptions for prednisone (steroid) and naproxen (anti inflammatory) were sent to the [place] at [facility].  Ice on your low back for a few minutes a couple times daily may be helpful in decreasing pain/inflammation.   If pain in back and down left leg persists, please followup with a spine specialist for further evaluation.    New Prescriptions Medications  . predniSONE (DELTASONE) 50 MG tablet    Sig: Take 1 tablet (50 mg total) by mouth daily.    Dispense:  5 tablet    Refill:  0  . naproxen (NAPROSYN) 500 MG tablet    Sig: Take 1 tablet (500 mg total) by mouth 2 (two) times daily.    Dispense:  30 tablet    Refill:  0      Sherlene Shams, MD 11/04/15  2131

## 2015-11-01 NOTE — ED Notes (Signed)
Dr Valere Dross discharged patient

## 2016-01-24 ENCOUNTER — Encounter (HOSPITAL_COMMUNITY): Payer: Self-pay | Admitting: *Deleted

## 2016-01-24 ENCOUNTER — Ambulatory Visit (HOSPITAL_COMMUNITY)
Admission: EM | Admit: 2016-01-24 | Discharge: 2016-01-24 | Disposition: A | Discharge status: Home / Self Care | Payer: Medicare HMO | Attending: Emergency Medicine | Admitting: Emergency Medicine

## 2016-01-24 DIAGNOSIS — G500 Trigeminal neuralgia: Secondary | ICD-10-CM | POA: Diagnosis not present

## 2016-01-24 MED ORDER — GABAPENTIN 300 MG PO CAPS: 300.0000 mg | ORAL_CAPSULE | ORAL | 0 refills | Status: DC

## 2016-01-24 NOTE — ED Provider Notes (Signed)
CSN: [id number]     Arrival date & time 01/24/16  1552 History   None    Chief Complaint  Patient presents with  . Headache   (Consider location/radiation/quality/duration/timing/severity/associated sxs/prior Treatment) 75 y.o. female presents with headache X 1 month that is intermittent and worsening. Patient describes the pain as " electrical sparks" Condition is acute in nature. Condition is made better by sleep. Condition is made worse by touching the right side of her head, eating, movement. Patient denies any relief from aspirin prior to there arrival at this facility. Patient has no prior history of headaches . No neurological deficit noted, no change in sensation. No rash noted to scalp      Past Medical History:  Diagnosis Date  . Coronary artery disease   . Hyperlipidemia    Past Surgical History:  Procedure Laterality Date  . CORONARY ARTERY BYPASS GRAFT     Family History  Problem Relation Age of Onset  . Family history unknown: Yes   Social History  Substance Use Topics  . Smoking status: Never Smoker  . Smokeless tobacco: Never Used  . Alcohol use No   OB History    No data available     Review of Systems  Neurological: Positive for headaches ( right sided of head and face).    Allergies  Patient has no known allergies.  Home Medications   Prior to Admission medications   Medication Sig Start Date End Date Taking? Authorizing Provider  gabapentin (NEURONTIN) 300 MG capsule Take 1 capsule (300 mg total) by mouth as directed. Take 1 tablet daily  for 2 days then take 1 tablet BID 01/24/16   Jacqualine Mau, NP  naproxen (NAPROSYN) 500 MG tablet Take 1 tablet (500 mg total) by mouth 2 (two) times daily. 11/01/15   Sherlene Shams, MD  Tetrahydrozoline HCl (VISINE OP) Apply 1-2 drops to eye daily as needed (dry eyes).    Historical Provider, MD   Meds Ordered and Administered this Visit  Medications - No data to display  BP 183/69 (BP Location:  Right Arm)   Pulse 73   Temp 98.2 F (36.8 C) (Oral)   Resp 18   SpO2 99%  No data found.   Physical Exam  Constitutional: She is oriented to person, place, and time. She appears well-developed and well-nourished.  HENT:  Head: Normocephalic and atraumatic.  Eyes: Conjunctivae are normal.  Neck: Normal range of motion.  Cardiovascular: Normal rate and regular rhythm.   Pulmonary/Chest: Effort normal and breath sounds normal.  Musculoskeletal: Normal range of motion.  Neurological: She is alert and oriented to person, place, and time.  Skin: Skin is warm.  Psychiatric: She has a normal mood and affect.  Nursing note and vitals reviewed.   Urgent Care Course   Clinical Course     Procedures (including critical care time)  Labs Review Labs Reviewed - No data to display  Imaging Review No results found.   Visual Acuity Review  Right Eye Distance:   Left Eye Distance:   Bilateral Distance:    Right Eye Near:   Left Eye Near:    Bilateral Near:         MDM   1. Trigeminal neuralgia       Jacqualine Mau, NP 01/24/16 1930

## 2016-01-24 NOTE — ED Triage Notes (Signed)
Patient reports right sided facial pain, sharp in nature and intermittent. States this am woke up with right eye swelling. Very sensitive to tough. Stroke scale negative.

## 2016-04-30 ENCOUNTER — Emergency Department (HOSPITAL_COMMUNITY)
Admission: EM | Admit: 2016-04-30 | Discharge: 2016-04-30 | Disposition: A | Discharge status: Home / Self Care | Payer: Medicare HMO | Attending: Emergency Medicine | Admitting: Emergency Medicine

## 2016-04-30 ENCOUNTER — Encounter (HOSPITAL_COMMUNITY): Payer: Self-pay

## 2016-04-30 DIAGNOSIS — I2510 Atherosclerotic heart disease of native coronary artery without angina pectoris: Secondary | ICD-10-CM | POA: Diagnosis not present

## 2016-04-30 DIAGNOSIS — Z951 Presence of aortocoronary bypass graft: Secondary | ICD-10-CM | POA: Diagnosis not present

## 2016-04-30 DIAGNOSIS — R51 Headache: Secondary | ICD-10-CM | POA: Diagnosis present

## 2016-04-30 DIAGNOSIS — R519 Headache, unspecified: Secondary | ICD-10-CM

## 2016-04-30 MED ORDER — OXYCODONE-ACETAMINOPHEN 5-325 MG PO TABS: 2.0000 | ORAL_TABLET | ORAL | 0 refills | Status: DC | PRN

## 2016-04-30 NOTE — ED Provider Notes (Signed)
[facility] DEPT Provider Note   CSN: [id number] Arrival date & time: 04/30/16  1521     History   Chief Complaint Chief Complaint  Patient presents with  . Facial Pain    HPI Debra Fry is a 76 y.o. female with PMHx of CAD, HLD, CABG, recently diagnosed trigeminal neuralgia at an Urgent Care in December presents today with worsening right-sided facial pain for the last 2 days. She states if feels like sharp "electric", nerve, pain that is intermittent, She states that she has had this for 3-4 months. She states her episodes last up to 5 minutes. She states it hurts to palpation. She reports associated pain while eating to her right side.  She denies numbness, tingling, changes in vision, changes in gait, photophobia, changes in hearing. She denies dental pain. She states she was seen at urgent care in December and was prescribed gabapentin. She states gabapentin usually works but not today. She states her symptoms originally started from the right side of her head in December and since then her pain has traveled behind eyes and now to her right jaw. She reports resolution of the right sided head pain. She reports not being able to talk this morning. She states she has tried calling neurology but was told she had to get a PCP referral first before being able to see neurologist. She tried to make an appointment with a PCP but first available appointment was in June.   The history is provided by the patient. No language interpreter was used.    Past Medical History:  Diagnosis Date  . Coronary artery disease   . Hyperlipidemia     There are no active problems to display for this patient.   Past Surgical History:  Procedure Laterality Date  . CORONARY ARTERY BYPASS GRAFT      OB History    No data available       Home Medications    Prior to Admission medications   Medication Sig Start Date End Date Taking? Authorizing Provider  gabapentin (NEURONTIN) 300 MG capsule Take  1 capsule (300 mg total) by mouth as directed. Take 1 tablet daily  for 2 days then take 1 tablet BID 01/24/16   Jacqualine Mau, NP  naproxen (NAPROSYN) 500 MG tablet Take 1 tablet (500 mg total) by mouth 2 (two) times daily. 11/01/15   Sherlene Shams, MD  oxyCODONE-acetaminophen (PERCOCET/ROXICET) 5-325 MG tablet Take 2 tablets by mouth every 4 (four) hours as needed for severe pain. 04/30/16   Huldah Marin Manuel [place], [place]  Tetrahydrozoline HCl (VISINE OP) Apply 1-2 drops to eye daily as needed (dry eyes).    Historical Provider, MD    Family History Family History  Problem Relation Age of Onset  . Family history unknown: Yes    Social History Social History  Substance Use Topics  . Smoking status: Never Smoker  . Smokeless tobacco: Never Used  . Alcohol use No     Allergies   Patient has no known allergies.   Review of Systems Review of Systems  Constitutional: Negative for chills and fever.  HENT: Negative for facial swelling.        Facial pain- right sided  Respiratory: Negative for shortness of breath.   Cardiovascular: Negative for chest pain.  Gastrointestinal: Negative for abdominal pain, diarrhea, nausea and vomiting.  Genitourinary: Negative for difficulty urinating and dysuria.  Neurological: Negative for numbness and headaches.  All other systems reviewed and are negative.  Physical Exam Updated Vital Signs BP (!) 159/65 (BP Location: Right Arm)   Pulse 65   Temp 97.8 F (36.6 C) (Oral)   Resp 18   Ht 5\' 2"  (1.575 m)   Wt 59 kg   SpO2 97%   BMI 23.78 kg/m   Physical Exam  Constitutional: She is oriented to person, place, and time. She appears well-developed and well-nourished.  Well appearing  HENT:  Head: Normocephalic and atraumatic.  Nose: Nose normal.  Mouth/Throat: Oropharynx is clear and moist.  Airway patent. No stridor. No drooling. No trismus. No molars present to right lower side of jaw. No pain to palpation of teeth. Pain  exhibited when pushing cheek laterally. No signs of dental abscess, gum swelling, gum redness, gum tenderness. No right-sided swelling, redness.  Eyes: Conjunctivae and EOM are normal. Pupils are equal, round, and reactive to light.  Neck: Normal range of motion. No JVD present.  No lymphadenopathy or swelling in neck.  Cardiovascular: Normal rate, normal heart sounds and intact distal pulses.   No murmur heard. Pulmonary/Chest: Effort normal and breath sounds normal. No stridor. No respiratory distress. She has no wheezes. She has no rales.  Normal work of breathing. No respiratory distress noted.   Abdominal: Soft. Bowel sounds are normal.  Musculoskeletal: Normal range of motion.  Neurological: She is alert and oriented to person, place, and time.  Cranial Nerves:  III,IV, VI: ptosis not present, extra-ocular movements intact bilaterally, direct and consensual pupillary light reflexes intact bilaterally V: facial sensation, jaw opening, and bite strength equal bilaterally VII: eyebrow raise, eyelid close, smile, frown, pucker equal bilaterally VIII: hearing grossly normal bilaterally  IX,X: palate elevation and swallowing intact XI: bilateral shoulder shrug and lateral head rotation equal and strong XII: midline tongue extension  Negative pronator drift, negative Romberg, negative RAM's, negative heel-to-shin, negative finger to nose.    Sensory intact.  Muscle strength 5/5 Patient able to ambulate without difficulty.   Skin: Skin is warm. Capillary refill takes less than 2 seconds. No rash noted.  Psychiatric: She has a normal mood and affect. Her behavior is normal.  Nursing note and vitals reviewed.    ED Treatments / Results  Labs (all labs ordered are listed, but only abnormal results are displayed) Labs Reviewed - No data to display  EKG  EKG Interpretation None       Radiology No results found.  Procedures Procedures (including critical care  time)  Medications Ordered in ED Medications - No data to display   Initial Impression / Assessment and Plan / ED Course  I have reviewed the triage vital signs and the nursing notes.  Pertinent labs & imaging results that were available during my care of the patient were reviewed by me and considered in my medical decision making (see chart for details).    Patient here with likely exacerbation of her trigeminal neuralgia. She has right-sided facial pain. No redness or swelling. Low suspicion of dentalgia, dental abscess. Patient has had no visual symptoms, changes in gait and no other neurological symptoms. Patient is neurologically intact. Cerebellar intact. Patient is able to stand and ambulate without difficulty. I feel safe for discharge at this time. Patient will be referred to neurologist regarding today's visit. Patient also encouraged to establish care with a primary care provider within one week. Patient is in no apparent distress, afebrile, hemodynamically stable. Reasons to immediately return to the ED discussed. Patient also seen and evaluated by Dr. Lacinda Axon who also agrees with  assessment and plan.   Final Clinical Impressions(s) / ED Diagnoses   Final diagnoses:  Facial pain    New Prescriptions Discharge Medication List as of 04/30/2016  6:06 PM    START taking these medications   Details  oxyCODONE-acetaminophen (PERCOCET/ROXICET) 5-325 MG tablet Take 2 tablets by mouth every 4 (four) hours as needed for severe pain., Starting Fri 04/30/2016, [place], [place] 04/30/16 1815    Nat Christen, MD 05/01/16 2002

## 2016-04-30 NOTE — ED Triage Notes (Signed)
Patient c/o right sided facial pain. Patient states she has had x 3-4 months. Patient was seen at an [facility] in 12/17 and was prescribed Gabapentin.Patient states the Gabapentin usually works, but not today. Patient states she was unable to talk this morning. Patient c/o tenderness to the right face.

## 2016-04-30 NOTE — ED Notes (Signed)
Discharge instructions, follow up care, and rx x1 reviewed with patient. Patient verbalized understanding. 

## 2016-04-30 NOTE — Discharge Instructions (Signed)
Please take Percocet as needed for pain. Please call to make an appointment with [facility] neurology tomorrow or Monday regarding today's visit. He is also scheduled appointment with a primary care provider next week regarding today's visit.  Get help right away if: Your pain is unbearable, and your pain medicine does not help.

## 2016-05-04 ENCOUNTER — Encounter: Payer: Self-pay | Admitting: Neurology

## 2016-05-04 ENCOUNTER — Ambulatory Visit: Payer: Self-pay | Admitting: Neurology

## 2016-05-04 ENCOUNTER — Other Ambulatory Visit (INDEPENDENT_AMBULATORY_CARE_PROVIDER_SITE_OTHER): Payer: Medicare HMO

## 2016-05-04 ENCOUNTER — Ambulatory Visit (INDEPENDENT_AMBULATORY_CARE_PROVIDER_SITE_OTHER): Payer: Medicare HMO | Admitting: Neurology

## 2016-05-04 VITALS — BP 132/72 | HR 73 | Ht 62.0 in | Wt 131.7 lb

## 2016-05-04 DIAGNOSIS — Z79899 Other long term (current) drug therapy: Secondary | ICD-10-CM

## 2016-05-04 DIAGNOSIS — G500 Trigeminal neuralgia: Secondary | ICD-10-CM | POA: Diagnosis not present

## 2016-05-04 LAB — BASIC METABOLIC PANEL
BUN: 24 mg/dL — ABNORMAL HIGH (ref 6–23)
CHLORIDE: 103 meq/L (ref 96–112)
CO2: 27 meq/L (ref 19–32)
Calcium: 9.5 mg/dL (ref 8.4–10.5)
Creatinine, Ser: 0.7 mg/dL (ref 0.40–1.20)
GFR: 87.09 mL/min (ref >60.00)
GLUCOSE: 120 mg/dL — AB (ref 70–99)
POTASSIUM: 4 meq/L (ref 3.5–5.1)
SODIUM: 136 meq/L (ref 135–145)

## 2016-05-04 MED ORDER — OXCARBAZEPINE 150 MG PO TABS: ORAL_TABLET | ORAL | 0 refills | Status: DC

## 2016-05-04 NOTE — Progress Notes (Signed)
NEUROLOGY CONSULTATION NOTE  Debra Fry MRN: [id number] DOB: 04-28-1940  Referring provider: ED referral Primary care provider: No PCP  Reason for consult:  Right facial pain  HISTORY OF PRESENT ILLNESS: Debra Fry is a 76 year old right-handed female with CAD status post CABG and hyperlipidemia who presents for right-sided facial pain.  She is accompanied by her daughter who supplements history.  History supplemented by ED and Urgent Care notes.  About a year ago, she developed a new right sided facial pain.  It is a sharp paroxysmal electric pain that involves the right V1 through V3 distribution of the trigeminal nerve.  She has associated swelling around the right eye.  There is no associated rash.  It is intermittent, occurring off and on throughout the day in 30 minute intervals.  She sometimes has pain-free days up to a week.  Talking, eating and brushing her teeth aggravates it.  Percocet helped.    She has been taking gabapentin 300mg  three times daily, which she was only taking as needed.  She returned to the ED on 04/30/16 for worsening pain.  She was prescribed Percocet.    Most recent attack lasted from last Thursday to last night.  PAST MEDICAL HISTORY: Past Medical History:  Diagnosis Date  . Coronary artery disease   . Hyperlipidemia     PAST SURGICAL HISTORY: Past Surgical History:  Procedure Laterality Date  . CORONARY ARTERY BYPASS GRAFT      MEDICATIONS: Current Outpatient Prescriptions on File Prior to Visit  Medication Sig Dispense Refill  . gabapentin (NEURONTIN) 300 MG capsule Take 1 capsule (300 mg total) by mouth as directed. Take 1 tablet daily  for 2 days then take 1 tablet BID 16 capsule 0  . naproxen (NAPROSYN) 500 MG tablet Take 1 tablet (500 mg total) by mouth 2 (two) times daily. 30 tablet 0  . oxyCODONE-acetaminophen (PERCOCET/ROXICET) 5-325 MG tablet Take 2 tablets by mouth every 4 (four) hours as needed for severe pain. 15 tablet 0  .  Tetrahydrozoline HCl (VISINE OP) Apply 1-2 drops to eye daily as needed (dry eyes).     No current facility-administered medications on file prior to visit.     ALLERGIES: No Known Allergies  FAMILY HISTORY: Family History  Problem Relation Age of Onset  . Family history unknown: Yes    SOCIAL HISTORY: Social History   Social History  . Marital status: Married    Spouse name: N/A  . Number of children: N/A  . Years of education: N/A   Occupational History  . Not on file.   Social History Main Topics  . Smoking status: Never Smoker  . Smokeless tobacco: Never Used  . Alcohol use No  . Drug use: No  . Sexual activity: Not on file   Other Topics Concern  . Not on file   Social History Narrative  . No narrative on file    REVIEW OF SYSTEMS: Constitutional: No fevers, chills, or sweats, no generalized fatigue, change in appetite Eyes: No visual changes, double vision, eye pain Ear, nose and throat: No hearing loss, ear pain, nasal congestion, sore throat Cardiovascular: No chest pain, palpitations Respiratory:  No shortness of breath at rest or with exertion, wheezes GastrointestinaI: No nausea, vomiting, diarrhea, abdominal pain, fecal incontinence Genitourinary:  No dysuria, urinary retention or frequency Musculoskeletal:  No neck pain, back pain Integumentary: No rash, pruritus, skin lesions Neurological: as above Psychiatric: No depression, insomnia, anxiety Endocrine: No palpitations, fatigue, diaphoresis,  mood swings, change in appetite, change in weight, increased thirst Hematologic/Lymphatic:  No purpura, petechiae. Allergic/Immunologic: no itchy/runny eyes, nasal congestion, recent allergic reactions, rashes  PHYSICAL EXAM: Vitals:   05/04/16 0814  BP: 132/72  Pulse: 73   General: No acute distress.  Patient appears well-groomed.  Head:  Normocephalic/atraumatic Eyes:  fundi examined but not visualized Neck: supple, no paraspinal tenderness, full  range of motion Back: No paraspinal tenderness Heart: regular rate and rhythm Lungs: Clear to auscultation bilaterally. Vascular: No carotid bruits. Neurological Exam: Mental status: alert and oriented to person, place, and time, recent and remote memory intact, fund of knowledge intact, attention and concentration intact, speech fluent and not dysarthric, language intact. Cranial nerves: CN I: not tested CN II: pupils equal, round and reactive to light, visual fields intact CN III, IV, VI:  full range of motion, no nystagmus, no ptosis CN V: facial sensation intact CN VII: upper and lower face symmetric CN VIII: hearing intact CN IX, X: gag intact, uvula midline CN XI: sternocleidomastoid and trapezius muscles intact CN XII: tongue midline Bulk & Tone: normal, no fasciculations. Motor:  5/5 throughout  Sensation: temperature and vibration sensation intact. Deep Tendon Reflexes:  2+ throughout, toes downgoing.  Finger to nose testing:  Without dysmetria.  Heel to shin:  Without dysmetria.  Gait:  Normal station and stride.  Able to turn and tandem walk. Romberg negative.  IMPRESSION: Right sided trigeminal neuralgia  PLAN: 1.  We will start oxcarbazepine 150mg  twice daily and increase to 300mg  twice daily in one week.  In 4 weeks, she should contact me with update. 2.  We will check a baseline BMP for Na level.  Repeat in 3 months prior to follow up. 3.  May use oxycodone as needed (although I informed her that I do not prescribe this).  Otherwise, may take ibuprofen, ASA or acetaminophen as needed. 4.  Follow up in 3 months.  Thank you for allowing me to take part in the care of this patient.  Metta Clines, DO

## 2016-05-04 NOTE — Patient Instructions (Addendum)
1.  Start oxcarbazepine 150mg  tablets:  Take 1 tablet twice daily for 7 days (1 in morning and 1 at night)  Then 2 tablets twice daily (2 in morning and 2 at night) 2.  Check BMP.  Repeat in 3 months prior to follow up. 3.  Let me know in 4 weeks how you are doing (sooner if in severe pain) 4.  Use ibuprofen or aspirin as needed for severe pain 5.  Follow up in 3 months.   Trigeminal Neuralgia Trigeminal neuralgia is a nerve disorder that causes attacks of severe facial pain. The attacks last from a few seconds to several minutes. They can happen for days, weeks, or months and then go away for months or years. Trigeminal neuralgia is also called tic douloureux. What are the causes? This condition is caused by damage to a nerve in the face that is called the trigeminal nerve. An attack can be triggered by:  Talking.  Chewing.  Putting on makeup.  Washing your face.  Shaving your face.  Brushing your teeth.  Touching your face. What increases the risk? This condition is more likely to develop in:  Women.  People who are 36 years of age or older. What are the signs or symptoms? The main symptom of this condition is pain in the jaw, lips, eyes, nose, scalp, forehead, and face. The pain may be intense, stabbing, electric, or shock-like. How is this diagnosed? This condition is diagnosed with a physical exam. A CT scan or MRI may be done to rule out other conditions that can cause facial pain. How is this treated? This condition may be treated with:  Avoiding the things that trigger your attacks.  Pain medicine.  Surgery. This may be done in severe cases if other medical treatment does not provide relief. Follow these instructions at home:  Take over-the-counter and prescription medicines only as told by your health care provider.  If you wish to get pregnant, talk with your health care provider before you start trying to get pregnant.  Avoid the things that trigger your  attacks. It may help to:  Chew on the unaffected side of your mouth.  Avoid touching your face.  Avoid blasts of hot or cold air. Contact a health care provider if:  Your pain medicine is not helping.  You develop new, unexplained symptoms, such as:  Double vision.  Facial weakness.  Changes in hearing or balance.  You become pregnant. Get help right away if:  Your pain is unbearable, and your pain medicine does not help. This information is not intended to replace advice given to you by your health care provider. Make sure you discuss any questions you have with your health care provider. Document Released: 01/09/2000 Document Revised: 09/14/2015 Document Reviewed: 05/06/2014 Elsevier Interactive Patient Education  2017 [employer].

## 2016-05-10 ENCOUNTER — Telehealth: Payer: Self-pay | Admitting: Neurology

## 2016-05-10 NOTE — Telephone Encounter (Signed)
Returned call. Went over Dr. Georgie Chard plan note from [facility] on 04/10. Patient verbalized understanding and was grateful.

## 2016-05-10 NOTE — Telephone Encounter (Signed)
Caller: PT  Urgent? No  Reason for the call: PT wants to know how long before the medication will start working that Dr Tomi Likens prescribed last week for her/Debra Fry

## 2016-05-31 ENCOUNTER — Telehealth: Payer: Self-pay | Admitting: Neurology

## 2016-05-31 NOTE — Telephone Encounter (Signed)
Patient states that mouth not getting any better and would like to talk to someone

## 2016-05-31 NOTE — Telephone Encounter (Signed)
I would increase oxcarbazepine to 300mg  in AM and 600mg  at bedtime for 7 days, then 600mg  twice daily.

## 2016-05-31 NOTE — Telephone Encounter (Signed)
Patient is taking her medication as directed but still having some pain. She took the last of her oxycodone this morning and it has helped.  She wondered if there is anything else or changes she needs to make.  Advised her last office note recommends ibuprofen, acetaminophen or aspirin.  Also suggested patient may need pain management.  Please advise.

## 2016-05-31 NOTE — Telephone Encounter (Signed)
Advised patient to take oxcarbazepine 300mg  (2) in the am and 600mg  (4) at bedtime for 7 days then increase to 600mg  (4) in the morning and 600mg  (4) at bedtime.

## 2016-06-04 ENCOUNTER — Other Ambulatory Visit: Payer: Self-pay | Admitting: Neurology

## 2016-06-14 ENCOUNTER — Telehealth: Payer: Self-pay | Admitting: Neurology

## 2016-06-14 NOTE — Telephone Encounter (Signed)
Patient left message on the voicemail about needing to talk to someone about her medication making her dizzy

## 2016-06-14 NOTE — Telephone Encounter (Signed)
Spoke to patients family member and he will have her return call.

## 2016-06-15 NOTE — Telephone Encounter (Signed)
Left message on machine for patient to call back.

## 2016-06-15 NOTE — Telephone Encounter (Signed)
Patient returned a call left by a nurse yesterday. She left message  with the after hours  answering service she would like someone to call her back

## 2016-07-13 ENCOUNTER — Ambulatory Visit: Payer: Medicare HMO | Admitting: Neurology

## 2016-08-02 ENCOUNTER — Other Ambulatory Visit: Payer: Self-pay | Admitting: Neurology

## 2016-08-02 NOTE — Telephone Encounter (Signed)
Called patient to inform med already sent in by Dr Tomi Likens today

## 2016-08-02 NOTE — Telephone Encounter (Signed)
PT called and said she needs a refill of Oxcarbazepine called in, she is out and said she was hurting really bad

## 2016-08-03 ENCOUNTER — Telehealth: Payer: Self-pay | Admitting: Neurology

## 2016-08-03 MED ORDER — OXCARBAZEPINE 600 MG PO TABS
600.0000 mg | ORAL_TABLET | Freq: Two times a day (BID) | ORAL | 3 refills | Status: DC
Start: 2016-08-03 — End: 2016-08-16

## 2016-08-03 NOTE — Telephone Encounter (Signed)
Spoke to patients family member and instructed them on the new medication dosage and how to take it. She verbalized understanding. Rx sent to pharmacy and instructed pt or family member to call back in a week with an update.

## 2016-08-03 NOTE — Telephone Encounter (Signed)
We can change the pill dose to 600mg  so she will only have to take 1 pill twice daily.  She should contact us in one week with update and we can further adjust dose if needed.

## 2016-08-03 NOTE — Telephone Encounter (Signed)
Caller: Olegario Shearer    Urgent? Yes  Reason for the call: She is having facial pain and cannot talk well. She was in tears on the phone. She is asking for her medication to be called in. Please call. Thanks

## 2016-08-03 NOTE — Telephone Encounter (Signed)
Spoke to patients family member who is there with her she is in extreme pain having trouble swallowing and speaking. She has been taking the oxcabazepine 8 tablets in total daily but she takes 2 pills at a time throughout the day. She states when she takes 4 pills at once she feels like she might pass out. Please advise.

## 2016-08-16 ENCOUNTER — Ambulatory Visit (INDEPENDENT_AMBULATORY_CARE_PROVIDER_SITE_OTHER): Payer: Medicare HMO | Admitting: Neurology

## 2016-08-16 ENCOUNTER — Other Ambulatory Visit: Payer: Self-pay | Admitting: *Deleted

## 2016-08-16 ENCOUNTER — Other Ambulatory Visit (INDEPENDENT_AMBULATORY_CARE_PROVIDER_SITE_OTHER): Payer: Medicare HMO

## 2016-08-16 ENCOUNTER — Encounter: Payer: Self-pay | Admitting: Neurology

## 2016-08-16 VITALS — BP 140/74 | HR 64 | Ht 62.0 in | Wt 135.0 lb

## 2016-08-16 DIAGNOSIS — Z79899 Other long term (current) drug therapy: Secondary | ICD-10-CM

## 2016-08-16 DIAGNOSIS — G500 Trigeminal neuralgia: Secondary | ICD-10-CM | POA: Diagnosis not present

## 2016-08-16 LAB — BASIC METABOLIC PANEL
BUN: 16 mg/dL (ref 6–23)
CALCIUM: 9.4 mg/dL (ref 8.4–10.5)
CO2: 29 meq/L (ref 19–32)
CREATININE: 0.73 mg/dL (ref 0.40–1.20)
Chloride: 102 mEq/L (ref 96–112)
GFR: 82.91 mL/min (ref >60.00)
GLUCOSE: 87 mg/dL (ref 70–99)
Potassium: 4.2 mEq/L (ref 3.5–5.1)
SODIUM: 137 meq/L (ref 135–145)

## 2016-08-16 MED ORDER — BACLOFEN 10 MG PO TABS: 5.0000 mg | ORAL_TABLET | Freq: Three times a day (TID) | ORAL | 2 refills | Status: DC

## 2016-08-16 NOTE — Progress Notes (Signed)
NEUROLOGY FOLLOW UP OFFICE NOTE  Debra Fry [id number]  HISTORY OF PRESENT ILLNESS: Debra Fry is a 76 year old right-handed female with CAD status post CABG and hyperlipidemia who follows up for right sided trigeminal neuralgia.  She is accompanied by her daughter who supplements history.    UPDATE: BMP from 05/04/16 showed Na 136, K 4, Cl 103, CO2 27, glucose 120, BUN 24 and Cr 0.70.  She was started on oxcarbazepine, titrated to 600mg  twice daily.  Due to drowsiness, she takes 300mg  in morning, 300mg  at noon and 600mg  at bedtime.  Pain is much improved.  There is no spontaneous paroxysmal pain.  However she still has some residual pain in the right V3 distribution, making it difficult to eat and brush her teeth on the right side.   HISTORY: In 2017, she developed a new right sided facial pain.  It is a severe sharp paroxysmal electric pain that involves the right V1 through V3 distribution of the trigeminal nerve.  She has associated swelling around the right eye.  There is no associated rash.  It is intermittent, occurring off and on throughout the day in 30 minute intervals.  She sometimes has pain-free days up to a week.  Talking, eating and brushing her teeth aggravates it.  Percocet helped.    PAST MEDICAL HISTORY: Past Medical History:  Diagnosis Date  . Coronary artery disease   . Hyperlipidemia     MEDICATIONS: Current Outpatient Prescriptions on File Prior to Visit  Medication Sig Dispense Refill  . gabapentin (NEURONTIN) 300 MG capsule Take 1 capsule (300 mg total) by mouth as directed. Take 1 tablet daily  for 2 days then take 1 tablet BID 16 capsule 0   No current facility-administered medications on file prior to visit.     ALLERGIES: No Known Allergies  FAMILY HISTORY: Family History  Problem Relation Age of Onset  . Family history unknown: Yes    SOCIAL HISTORY: Social History   Social History  . Marital status: Married    Spouse name: N/A  . Number  of children: N/A  . Years of education: N/A   Occupational History  . Not on file.   Social History Main Topics  . Smoking status: Never Smoker  . Smokeless tobacco: Never Used  . Alcohol use No  . Drug use: No  . Sexual activity: Not on file   Other Topics Concern  . Not on file   Social History Narrative  . No narrative on file    REVIEW OF SYSTEMS: Constitutional: No fevers, chills, or sweats, no generalized fatigue, change in appetite Eyes: No visual changes, double vision, eye pain Ear, nose and throat: No hearing loss, ear pain, nasal congestion, sore throat Cardiovascular: No chest pain, palpitations Respiratory:  No shortness of breath at rest or with exertion, wheezes GastrointestinaI: No nausea, vomiting, diarrhea, abdominal pain, fecal incontinence Genitourinary:  No dysuria, urinary retention or frequency Musculoskeletal:  No neck pain, back pain Integumentary: No rash, pruritus, skin lesions Neurological: as above Psychiatric: No depression, insomnia, anxiety Endocrine: No palpitations, fatigue, diaphoresis, mood swings, change in appetite, change in weight, increased thirst Hematologic/Lymphatic:  No purpura, petechiae. Allergic/Immunologic: no itchy/runny eyes, nasal congestion, recent allergic reactions, rashes  PHYSICAL EXAM: Vitals:   08/16/16 1428  BP: 140/74  Pulse: 64   General: No acute distress.  Patient appears well-groomed.  normal body habitus. Head:  Normocephalic/atraumatic Eyes:  Fundi examined but not visualized Neck: supple, no paraspinal tenderness,  full range of motion Heart:  Regular rate and rhythm Lungs:  Clear to auscultation bilaterally Back: No paraspinal tenderness Neurological Exam: alert and oriented to person, place, and time. Attention span and concentration intact, recent and remote memory intact, fund of knowledge intact.  Speech fluent and not dysarthric, language intact.  CN II-XII intact. Bulk and tone normal, muscle  strength 5/5 throughout.  Sensation to light touch, temperature and vibration intact.  Deep tendon reflexes 2+ throughout, toes downgoing.  Finger to nose and heel to shin testing intact.  Gait normal, Romberg negative.  IMPRESSION: Right sided trigeminal neuralgia  PLAN: 1.  Continue oxcarbazepine 600mg  tablet (1/2 tablet in morning, 1/2 tablet at noon and 1 tablet at bedtime).  We will check a BMP. 2.  Since increased doses of oxcarbazepine caused drowsiness, we will also start baclofen 10mg  three times daily as well. 3.  Follow up in 3 months.  Metta Clines, DO

## 2016-08-16 NOTE — Patient Instructions (Signed)
1.  Continue oxcarbazepine 600mg  tablet (1/2 tablet in morning, 1/2 tablet at noon and 1 tablet at bedtime).  We will check a BMP. 2.  Also start baclofen 10mg  three times daily as well (morning, noon, bedtime) 3.  Follow up in 3 months.

## 2016-08-17 ENCOUNTER — Telehealth: Payer: Self-pay | Admitting: Neurology

## 2016-08-17 NOTE — Telephone Encounter (Signed)
-----   Message from Pieter Partridge, DO sent at 08/17/2016  6:40 AM EDT ----- Labs are normal

## 2016-08-17 NOTE — Telephone Encounter (Signed)
Patient's husband made aware labs okay. He will make patient aware.

## 2016-08-31 ENCOUNTER — Telehealth: Payer: Self-pay | Admitting: Neurology

## 2016-08-31 NOTE — Telephone Encounter (Signed)
Lm on VM for Pt to rtrn my call.

## 2016-08-31 NOTE — Telephone Encounter (Signed)
Patient daughter Abigail Butts called and states that we need to call her mother she is in a lot of pain and feels like she is about to pass out. She is really sick her daughter states  And not sure what to do

## 2016-08-31 NOTE — Telephone Encounter (Signed)
Patient returned your call.  Thanks!

## 2016-08-31 NOTE — Telephone Encounter (Signed)
Spoke w/Pt, she states awoke w/hands and mouth "on fire" around 3am. Ran cold water over hands, used water pick to put cool water throughout her mouth, took 30-40 minutes to help. Asked her what medications she had taken last night, Pt took oxcarbazine prior to bed. Her back was bothering her, she also took ASA that she rarely takes, that was only different med. She was dizzy, unable to ambulate x3hrs Advsd her not to take any further medications until she hears back from [place].

## 2016-08-31 NOTE — Telephone Encounter (Signed)
Has she been taking the Baclofen that Dr. Tomi Likens prescribed last week? Dizziness may be side effect, but not quite sure about burning in her hands and mouth. Is the burning in her mouth the same as her trigeminal neuralgia? If it is, would try slightly increasing the oxcarbazepine, she was taking 1/2 in AM, 1/2 at noon, 1 in PM, would try doing 1 in AM, 1/2 at noon, 1 in PM.

## 2016-09-01 NOTE — Telephone Encounter (Signed)
Spoke w/Pt, advsd her of below recommendations. Pt verbalized understanding.

## 2016-09-01 NOTE — Telephone Encounter (Signed)
Attempted to reach Pt again, LMOVM

## 2016-09-01 NOTE — Telephone Encounter (Signed)
Please advise the patient to take benadryl for hives, tylenol for pain, and if no better, would recommend she go to urgent care to be evaluated.

## 2016-09-01 NOTE — Telephone Encounter (Signed)
Pt rtrnd call. She has not been taking the baclofen, nor has she taken any further meds since we talked yesterday.She states the mouth and hand burning has resolved. She is now hurting all over and has broken out in hives.. I asked her if she had taken any benadryl, she said she doesn't have any. I asked if she had contacted her PCP, she said she doesn't really have a PCP. What should I advise her to do?

## 2016-09-02 ENCOUNTER — Ambulatory Visit (HOSPITAL_COMMUNITY)
Admission: EM | Admit: 2016-09-02 | Discharge: 2016-09-02 | Disposition: A | Discharge status: Home / Self Care | Payer: Medicare HMO | Attending: Internal Medicine | Admitting: Internal Medicine

## 2016-09-02 ENCOUNTER — Telehealth (HOSPITAL_COMMUNITY): Payer: Self-pay | Admitting: Emergency Medicine

## 2016-09-02 ENCOUNTER — Encounter (HOSPITAL_COMMUNITY): Payer: Self-pay | Admitting: Emergency Medicine

## 2016-09-02 DIAGNOSIS — B029 Zoster without complications: Secondary | ICD-10-CM | POA: Diagnosis not present

## 2016-09-02 DIAGNOSIS — G500 Trigeminal neuralgia: Secondary | ICD-10-CM | POA: Diagnosis not present

## 2016-09-02 MED ORDER — VALACYCLOVIR HCL 1 G PO TABS: 1000.0000 mg | ORAL_TABLET | Freq: Three times a day (TID) | ORAL | 0 refills | Status: DC

## 2016-09-02 MED ORDER — GABAPENTIN 300 MG PO CAPS: ORAL_CAPSULE | ORAL | 0 refills | Status: DC

## 2016-09-02 MED ORDER — PREDNISONE 50 MG PO TABS: ORAL_TABLET | ORAL | 0 refills | Status: DC

## 2016-09-02 NOTE — ED Triage Notes (Signed)
The patient presented to the [facility] with a complaint of a rash on her left side x 4 days that she believed to be shingles.

## 2016-09-02 NOTE — ED Provider Notes (Signed)
[facility]    CSN: [id number] Arrival date & time: 09/02/16  1825     History   Chief Complaint Chief Complaint  Patient presents with  . Rash    HPI Debra Fry is a 76 y.o. female.   76 year old female presents with a papulovesicular rash with underlying erythema along the T10 T9 dermatome from the umbilicus to the mid back on the left side. This started yesterday. It is not particularly painful. Prior to this and earlier this week she had been treated with a new anti-neuralgia drug Trileptal and possibly had an adverse reaction and was told to stop it. She continues to have the pain a trigeminal around June this started in April.       Past Medical History:  Diagnosis Date  . Coronary artery disease   . Hyperlipidemia     There are no active problems to display for this patient.   Past Surgical History:  Procedure Laterality Date  . CORONARY ARTERY BYPASS GRAFT      OB History    No data available       Home Medications    Prior to Admission medications   Medication Sig Start Date End Date Taking? Authorizing Provider  baclofen (LIORESAL) 10 MG tablet Take 0.5 tablets (5 mg total) by mouth 3 (three) times daily. 08/16/16   Pieter Partridge, DO  gabapentin (NEURONTIN) 300 MG capsule 1 tab po at bedtime 1st day, 1 tablet bid second day, then 1 tablet tid 09/02/16   Janne Napoleon, NP  predniSONE (DELTASONE) 50 MG tablet 1 tab po daily for 6 days. Take with food. 09/02/16   Janne Napoleon, NP  valACYclovir (VALTREX) 1000 MG tablet Take 1 tablet (1,000 mg total) by mouth 3 (three) times daily. X 7 days 09/02/16   Janne Napoleon, NP    Family History Family History  Problem Relation Age of Onset  . Family history unknown: Yes    Social History Social History  Substance Use Topics  . Smoking status: Never Smoker  . Smokeless tobacco: Never Used  . Alcohol use No     Allergies   Patient has no known allergies.   Review of Systems Review of Systems    Constitutional: Positive for activity change.  HENT:       Right facial pain and short bursts and spasms.  Respiratory: Negative.   Gastrointestinal: Negative.   Musculoskeletal: Negative.   Neurological:       Dizziness, malaise and weakness developed a couple of days ago after upping her Trileptal. Since stopping it she is feeling better in that regard.  Psychiatric/Behavioral: Negative.   All other systems reviewed and are negative.    Physical Exam Triage Vital Signs ED Triage Vitals [09/02/16 1852]  Enc Vitals Group     BP 121/68     Pulse Rate 84     Resp 16     Temp 98.2 F (36.8 C)     Temp Source Oral     SpO2 98 %     Weight      Height      Head Circumference      Peak Flow      Pain Score 10     Pain Loc      Pain Edu?      Excl. in [place]?    No data found.   Updated Vital Signs BP 121/68 (BP Location: Right Arm)   Pulse 84   Temp 98.2  F (36.8 C) (Oral)   Resp 16   SpO2 98%   Visual Acuity Right Eye Distance:   Left Eye Distance:   Bilateral Distance:    Right Eye Near:   Left Eye Near:    Bilateral Near:     Physical Exam  Constitutional: She is oriented to person, place, and time. She appears well-developed and well-nourished. No distress.  HENT:  Head: Normocephalic and atraumatic.  Eyes: EOM are normal.  Neck: Neck supple.  Cardiovascular: Normal rate.   Pulmonary/Chest: Effort normal.  Musculoskeletal: She exhibits no edema or deformity.  Neurological: She is alert and oriented to person, place, and time. No cranial nerve deficit.  Skin: Skin is warm and dry.  Primarily vesicular rash over a red base over the T9-T10 dermatome from them like [place] to the mid spine.  Nursing note and vitals reviewed.    UC Treatments / Results  Labs (all labs ordered are listed, but only abnormal results are displayed) Labs Reviewed - No data to display  EKG  EKG Interpretation None       Radiology No results  found.  Procedures Procedures (including critical care time)  Medications Ordered in UC Medications - No data to display   Initial Impression / Assessment and Plan / UC Course  I have reviewed the triage vital signs and the nursing notes.  Pertinent labs & imaging results that were available during my care of the patient were reviewed by me and considered in my medical decision making (see chart for details).     Take the medication as directed. Call the neurologist to make an appointment for next week. Wash her hands frequently and try not to touch to the rash.   Final Clinical Impressions(s) / UC Diagnoses   Final diagnoses:  Trigeminal neuralgia of right side of face  Herpes zoster without complication    New Prescriptions New Prescriptions   GABAPENTIN (NEURONTIN) 300 MG CAPSULE    1 tab po at bedtime 1st day, 1 tablet bid second day, then 1 tablet tid   PREDNISONE (DELTASONE) 50 MG TABLET    1 tab po daily for 6 days. Take with food.   VALACYCLOVIR (VALTREX) 1000 MG TABLET    Take 1 tablet (1,000 mg total) by mouth 3 (three) times daily. X 7 days     Controlled Substance Prescriptions [place] Controlled Substance Registry consulted? Yes, I have consulted the [place] Controlled Substances Registry for this patient, and feel the risk/benefit ratio today is favorable for proceeding with this prescription for a controlled substance.   Janne Napoleon, NP 09/02/16 1949

## 2016-09-02 NOTE — Discharge Instructions (Signed)
Take the medication as directed. Call the neurologist to make an appointment for next week. Wash her hands frequently and try not to touch to the rash.

## 2016-09-02 NOTE — Telephone Encounter (Signed)
The patient requested that her medications be resent to the [facility] on [address].

## 2016-09-03 ENCOUNTER — Telehealth: Payer: Self-pay | Admitting: Neurology

## 2016-09-03 NOTE — Telephone Encounter (Signed)
Patient asked daughter in law to call in on her behalf. She has broke out in Shingles and was at Urgent care last night. She has been in pain with her Trigeminal Neuralgia. She had called the office earlier in the week regarding her medication and side effects. Please call. Patient was advised by Urgent care to get a follow up appointment asap. Please advise. Thanks

## 2016-09-06 NOTE — Telephone Encounter (Signed)
LM on VM for Pt to rtrn call 

## 2016-09-09 ENCOUNTER — Telehealth: Payer: Self-pay

## 2016-09-10 NOTE — Telephone Encounter (Signed)
Opened in error

## 2016-09-24 ENCOUNTER — Telehealth: Payer: Self-pay | Admitting: Neurology

## 2016-09-24 ENCOUNTER — Other Ambulatory Visit: Payer: Self-pay

## 2016-09-24 MED ORDER — GABAPENTIN 300 MG PO CAPS: ORAL_CAPSULE | ORAL | 1 refills | Status: DC

## 2016-09-24 NOTE — Telephone Encounter (Signed)
Patient lmom to have you please call her. She did not say the reason for the call back. Thanks

## 2016-09-24 NOTE — Telephone Encounter (Signed)
Called Pt, confirmed she is not taking baclofen nor the oxcerbazepine. Advsd Pt to taek the gabapentin 300mg  TID and to contact us if needed and we can increase further. Pt rqstd I Rx to [facility] on [address]. Sent Rx

## 2016-09-24 NOTE — Telephone Encounter (Signed)
We can refill it, but if the pain is still uncontrolled, I recommend taking gabapentin 300mg  three times daily everyday (to treat both Shingles and trigeminal neuralgia).  If needed, we can continue increasing dose.  Also, I want to verify that she is not taking the oxcarbazepine and baclofen.

## 2016-09-24 NOTE — Telephone Encounter (Signed)
Spoke w/Pt, she is rqstng a refill on gabapentin 300 mg she is taking BID most days, but was Rx'd TID and she does take TID when she feels she needs it. Shingles are sill bad. She'd like you to do this instead of urgent care. Pt rqst I call her back @ an alternate [phone]

## 2016-10-05 ENCOUNTER — Ambulatory Visit (INDEPENDENT_AMBULATORY_CARE_PROVIDER_SITE_OTHER): Payer: Medicare HMO | Admitting: Neurology

## 2016-10-05 ENCOUNTER — Encounter: Payer: Self-pay | Admitting: Neurology

## 2016-10-05 VITALS — BP 120/70 | HR 62 | Ht 62.0 in | Wt 130.0 lb

## 2016-10-05 DIAGNOSIS — B0229 Other postherpetic nervous system involvement: Secondary | ICD-10-CM

## 2016-10-05 DIAGNOSIS — G500 Trigeminal neuralgia: Secondary | ICD-10-CM

## 2016-10-05 MED ORDER — GABAPENTIN 300 MG PO CAPS: 600.0000 mg | ORAL_CAPSULE | Freq: Three times a day (TID) | ORAL | 2 refills | Status: DC

## 2016-10-05 NOTE — Progress Notes (Signed)
NEUROLOGY FOLLOW UP OFFICE NOTE  Debra Fry [id number]  HISTORY OF PRESENT ILLNESS: Debra Fry is a 76 year old right-handed female with CAD status post CABG and hyperlipidemia who follows up for right sided trigeminal neuralgia and now post-herpetic neuralgia.  She is accompanied by her daughter who supplements history.     UPDATE: She was taking oxcarbazepine 300mg /300mg /600mg  (higher doses caused drowsiness) and baclofen 10mg  three times daily.  She stopped both medications due to onset of burning mouth and burning of the hands.  On 09/01/16, she developed a papulovesicular rash along the T9-T10 dermatomes on the left and discontinued the oxcarbazepine and baclofen, believing it to be a reaction to the medication.  She presented to the urgent care the next day and was diagnosed with herpes zoster.  She was prescribed a course of prednisone and valacyclovir and was started on gabapentin, taking 300mg  three times daily.  The trigeminal neuralgia is well-controlled but she still suffers from post-herpetic neuralgia.  08/16/16 LABS:  BMP with Na 137, K 4.2, Cl 102. Co2 29, glucose 87, BUN 16, Cr 0.73.   HISTORY: In 2017, she developed a new right sided facial pain.  It is a severe sharp paroxysmal electric pain that involves the right V1 through V3 distribution of the trigeminal nerve.  She has associated swelling around the right eye.  There is no associated rash.  It is intermittent, occurring off and on throughout the day in 30 minute intervals.  She sometimes has pain-free days up to a week.  Talking, eating and brushing her teeth aggravates it.  Percocet helped.    PAST MEDICAL HISTORY: Past Medical History:  Diagnosis Date  . Coronary artery disease   . Hyperlipidemia     MEDICATIONS: Gabapentin 300mg  three times daily.  ALLERGIES: No Known Allergies  FAMILY HISTORY: Family History  Problem Relation Age of Onset  . Family history unknown: Yes   .  SOCIAL HISTORY: Social  History   Social History  . Marital status: Married    Spouse name: N/A  . Number of children: N/A  . Years of education: N/A   Occupational History  . Not on file.   Social History Main Topics  . Smoking status: Never Smoker  . Smokeless tobacco: Never Used  . Alcohol use No  . Drug use: No  . Sexual activity: Not on file   Other Topics Concern  . Not on file   Social History Narrative  . No narrative on file    REVIEW OF SYSTEMS: Constitutional: No fevers, chills, or sweats, no generalized fatigue, change in appetite Eyes: No visual changes, double vision, eye pain Ear, nose and throat: No hearing loss, ear pain, nasal congestion, sore throat Cardiovascular: No chest pain, palpitations Respiratory:  No shortness of breath at rest or with exertion, wheezes GastrointestinaI: No nausea, vomiting, diarrhea, abdominal pain, fecal incontinence Genitourinary:  No dysuria, urinary retention or frequency Musculoskeletal:  No neck pain, back pain Integumentary: papulovesicular rash along the left T9-T10 dermatome Neurological: as above Psychiatric: No depression, insomnia, anxiety Endocrine: No palpitations, fatigue, diaphoresis, mood swings, change in appetite, change in weight, increased thirst Hematologic/Lymphatic:  No purpura, petechiae. Allergic/Immunologic: no itchy/runny eyes, nasal congestion, recent allergic reactions, rashes  PHYSICAL EXAM: Vitals:   10/05/16 0731  BP: 120/70  Pulse: 62  SpO2: 97%   General: No acute distress.  Patient appears well-groomed.  normal body habitus. Head:  Normocephalic/atraumatic Eyes:  Fundi examined but not visualized Neck: supple, no  paraspinal tenderness, full range of motion Heart:  Regular rate and rhythm Lungs:  Clear to auscultation bilaterally Back: No paraspinal tenderness Neurological Exam: alert and oriented to person, place, and time. Attention span and concentration intact, recent and remote memory intact, fund of  knowledge intact.  Speech fluent and not dysarthric, language intact.  CN II-XII intact. Bulk and tone normal, muscle strength 5/5 throughout.  Sensation to light touch, temperature and vibration intact.  Deep tendon reflexes 2+ throughout, toes downgoing.  Finger to nose and heel to shin testing intact.  Gait normal, Romberg negative.  IMPRESSION: 1.  Right sided trigeminal neuralgia 2.  Left T9-T10 post-herpetic neuralgia  PLAN: We will titrate gabapentin to 600mg  three times daily Follow up in 2 months.  15 minutes spent face to face with patient, over 50% spent discussing management.  Metta Clines, DO

## 2016-10-05 NOTE — Patient Instructions (Signed)
1.  We will increase gabapentin 300mg  capsules.  Take 1 capsule in AM, 1 capsule in afternoon and 2 capsules at bedtime for 7 days  Then 2 capsules in AM, 1 capsule in afternoon and 2 capsules at bedtime for 7 days  Then 2 capsules three times daily 2.  Follow up in 2 months.

## 2016-10-06 ENCOUNTER — Ambulatory Visit: Payer: Medicare HMO | Admitting: Neurology

## 2016-10-28 ENCOUNTER — Ambulatory Visit: Payer: Medicare HMO | Admitting: Neurology

## 2016-11-26 ENCOUNTER — Ambulatory Visit: Payer: Medicare HMO | Admitting: Neurology

## 2016-12-09 ENCOUNTER — Ambulatory Visit: Payer: Medicare HMO | Admitting: Neurology

## 2016-12-09 ENCOUNTER — Encounter: Payer: Self-pay | Admitting: Neurology

## 2016-12-09 VITALS — BP 128/60 | HR 75 | Ht 62.0 in | Wt 132.2 lb

## 2016-12-09 DIAGNOSIS — B0229 Other postherpetic nervous system involvement: Secondary | ICD-10-CM

## 2016-12-09 DIAGNOSIS — G500 Trigeminal neuralgia: Secondary | ICD-10-CM

## 2016-12-09 NOTE — Progress Notes (Signed)
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  She is accompanied by her daughter who supplements history.     UPDATE: She is taking gabapentin 600mg  three times daily.  Trigeminal neuralgia is completely controlled. She still notes some paresthesias in area of her shingles when she lays on that side in bed.  Severe pain is resolved.   HISTORY: I Trigeminal Neuralgia: In 2017, she developed a new right sided facial pain.  It is a severe sharp paroxysmal electric pain that involves the right V1 through V3 distribution of the trigeminal nerve.  She has associated swelling around the right eye.  There is no associated rash.  It is intermittent, occurring off and on throughout the day in 30 minute intervals.  She sometimes would have pain-free days up to a week.  Talking, eating and brushing her teeth aggravates it.  Percocet helped.  She was taking oxcarbazepine 300mg /300mg /600mg  (higher doses caused drowsiness) and baclofen 10mg  three times daily.  She stopped both medications due to onset of burning mouth and burning of the hands.    II Post-Herpetic Neuralgia: On 09/01/16, she developed a papulovesicular rash along the T9-T10 dermatomes on the left and discontinued the oxcarbazepine and baclofen, believing it to be a reaction to the medication.  She presented to the urgent care the next day and was diagnosed with herpes zoster.  She was prescribed a course of prednisone and valacyclovir and was started on gabapentin  PAST MEDICAL HISTORY: Past Medical History:  Diagnosis Date  . Coronary artery disease   . Hyperlipidemia     MEDICATIONS: Current Outpatient Medications on File Prior to Visit  Medication Sig Dispense Refill  . gabapentin (NEURONTIN) 300 MG capsule Take 2  capsules (600 mg total) by mouth 3 (three) times daily. 240 capsule 2   No current facility-administered medications on file prior to visit.     ALLERGIES: No Known Allergies  FAMILY HISTORY: Family History  Family history unknown: Yes    SOCIAL HISTORY: Social History   Socioeconomic History  . Marital status: Married    Spouse name: Not on file  . Number of children: Not on file  . Years of education: Not on file  . Highest education level: Not on file  Social Needs  . Financial resource strain: Not on file  . Food insecurity - worry: Not on file  . Food insecurity - inability: Not on file  . Transportation needs - medical: Not on file  . Transportation needs - non-medical: Not on file  Occupational History  . Not on file  Tobacco Use  . Smoking status: Never Smoker  . Smokeless tobacco: Never Used  Substance and Sexual Activity  . Alcohol use: No  . Drug use: No  . Sexual activity: Not on file  Other Topics Concern  . Not on file  Social History Narrative  . Not on file    REVIEW OF SYSTEMS: Constitutional: No fevers, chills, or sweats, no generalized fatigue, change in appetite Eyes: No visual changes, double vision, eye pain Ear, nose and throat: No hearing loss, ear pain, nasal congestion, sore throat Cardiovascular: No chest pain, palpitations Respiratory:  No shortness of breath at rest or with exertion, wheezes GastrointestinaI: No nausea, vomiting, diarrhea, abdominal pain, fecal incontinence Genitourinary:  No dysuria,  urinary retention or frequency Musculoskeletal:  No neck pain, back pain Integumentary: No rash, pruritus, skin lesions Neurological: as above Psychiatric: No depression, insomnia, anxiety Endocrine: No palpitations, fatigue, diaphoresis, mood swings, change in appetite, change in weight, increased thirst Hematologic/Lymphatic:  No purpura, petechiae. Allergic/Immunologic: no itchy/runny eyes, nasal congestion, recent allergic  reactions, rashes  PHYSICAL EXAM: Vitals:   12/09/16 1135  BP: 128/60  Pulse: 75  SpO2: 97%   General: No acute distress.  Patient appears well-groomed.  normal body habitus. Head:  Normocephalic/atraumatic Eyes:  Fundi examined but not visualized Neck: supple, no paraspinal tenderness, full range of motion Heart:  Regular rate and rhythm Lungs:  Clear to auscultation bilaterally Back: No paraspinal tenderness Neurological Exam: alert and oriented to person, place, and time. Attention span and concentration intact, recent and remote memory intact, fund of knowledge intact.  Speech fluent and not dysarthric, language intact.  CN II-XII intact. Bulk and tone normal, muscle strength 5/5 throughout.  Sensation to light touch  intact.  Deep tendon reflexes 2+ throughout.  Finger to nose testing intact.  Gait normal, Romberg negative.  IMPRESSION: Right sided trigeminal neuralgia Post-herpetic neuralgia left T9-T10  PLAN: 1.  Continue gabapentin 600mg  three times daily.  Consider weaning off next visit. 2.  Try OTC lidocaine patch and apply to affected area of torso to control discomfort at night. 3.  Follow up in 5 months.  18 minutes spent face to face with patient, over 50% spent discussing management.  Metta Clines, DO

## 2016-12-09 NOTE — Patient Instructions (Signed)
1.  Continue gabapentin 600mg  three times daily 2.  Follow up in 5 months.  If doing well, we can discuss weaning off gabapentin 3.  Try lidocaine patch (over the counter)

## 2016-12-18 ENCOUNTER — Other Ambulatory Visit: Payer: Self-pay | Admitting: Neurology

## 2016-12-20 NOTE — Telephone Encounter (Signed)
Called Pt, LM on VM requesting call back, advising last OV looks like she was not taking baclofen. Wanted to find out if she rqstd refill or was auto refill from pharmacy

## 2017-01-31 ENCOUNTER — Other Ambulatory Visit: Payer: Self-pay | Admitting: Neurology

## 2017-03-12 ENCOUNTER — Other Ambulatory Visit: Payer: Self-pay | Admitting: Neurology

## 2017-05-12 ENCOUNTER — Ambulatory Visit: Payer: Medicare HMO | Admitting: Neurology

## 2018-01-27 ENCOUNTER — Encounter (HOSPITAL_COMMUNITY): Payer: Self-pay | Admitting: Radiology

## 2018-01-27 ENCOUNTER — Emergency Department (HOSPITAL_COMMUNITY): Payer: Medicare HMO

## 2018-01-27 ENCOUNTER — Inpatient Hospital Stay (HOSPITAL_COMMUNITY)
Admission: EM | Admit: 2018-01-27 | Discharge: 2018-01-29 | DRG: 066 | Disposition: A | Discharge status: Home / Self Care | Payer: Medicare HMO | Attending: Internal Medicine | Admitting: Internal Medicine

## 2018-01-27 DIAGNOSIS — R7303 Prediabetes: Secondary | ICD-10-CM | POA: Diagnosis present

## 2018-01-27 DIAGNOSIS — R29898 Other symptoms and signs involving the musculoskeletal system: Secondary | ICD-10-CM

## 2018-01-27 DIAGNOSIS — I639 Cerebral infarction, unspecified: Principal | ICD-10-CM | POA: Diagnosis present

## 2018-01-27 DIAGNOSIS — R51 Headache: Secondary | ICD-10-CM | POA: Diagnosis present

## 2018-01-27 DIAGNOSIS — I2510 Atherosclerotic heart disease of native coronary artery without angina pectoris: Secondary | ICD-10-CM | POA: Diagnosis present

## 2018-01-27 DIAGNOSIS — E785 Hyperlipidemia, unspecified: Secondary | ICD-10-CM | POA: Diagnosis present

## 2018-01-27 DIAGNOSIS — Z951 Presence of aortocoronary bypass graft: Secondary | ICD-10-CM

## 2018-01-27 DIAGNOSIS — I6523 Occlusion and stenosis of bilateral carotid arteries: Secondary | ICD-10-CM | POA: Diagnosis present

## 2018-01-27 DIAGNOSIS — E876 Hypokalemia: Secondary | ICD-10-CM | POA: Diagnosis not present

## 2018-01-27 DIAGNOSIS — R531 Weakness: Secondary | ICD-10-CM | POA: Diagnosis present

## 2018-01-27 DIAGNOSIS — I10 Essential (primary) hypertension: Secondary | ICD-10-CM | POA: Diagnosis present

## 2018-01-27 DIAGNOSIS — Z79899 Other long term (current) drug therapy: Secondary | ICD-10-CM

## 2018-01-27 LAB — PROTIME-INR
INR: 1.12
Prothrombin Time: 14.3 seconds (ref 11.4–15.2)

## 2018-01-27 LAB — CBC
HCT: 39.5 % (ref 36.0–46.0)
Hemoglobin: 13 g/dL (ref 12.0–15.0)
MCH: 29.8 pg (ref 26.0–34.0)
MCHC: 32.9 g/dL (ref 30.0–36.0)
MCV: 90.6 fL (ref 80.0–100.0)
NRBC: 0 % (ref 0.0–0.2)
Platelets: 246 10*3/uL (ref 150–400)
RBC: 4.36 MIL/uL (ref 3.87–5.11)
RDW: 12.9 % (ref 11.5–15.5)
WBC: 6.1 10*3/uL (ref 4.0–10.5)

## 2018-01-27 LAB — I-STAT CHEM 8, ED
BUN: 16 mg/dL (ref 8–23)
Calcium, Ion: 1.2 mmol/L (ref 1.15–1.40)
Chloride: 103 mmol/L (ref 98–111)
Creatinine, Ser: 0.7 mg/dL (ref 0.44–1.00)
Glucose, Bld: 102 mg/dL — ABNORMAL HIGH (ref 70–99)
HCT: 39 % (ref 36.0–46.0)
Hemoglobin: 13.3 g/dL (ref 12.0–15.0)
Potassium: 3.8 mmol/L (ref 3.5–5.1)
SODIUM: 138 mmol/L (ref 135–145)
TCO2: 27 mmol/L (ref 22–32)

## 2018-01-27 LAB — APTT: aPTT: 31 seconds (ref 24–36)

## 2018-01-27 LAB — COMPREHENSIVE METABOLIC PANEL
ALT: 16 U/L (ref 0–44)
ANION GAP: 7 (ref 5–15)
AST: 21 U/L (ref 15–41)
Albumin: 3.8 g/dL (ref 3.5–5.0)
Alkaline Phosphatase: 62 U/L (ref 38–126)
BUN: 13 mg/dL (ref 8–23)
CO2: 26 mmol/L (ref 22–32)
Calcium: 9.4 mg/dL (ref 8.9–10.3)
Chloride: 104 mmol/L (ref 98–111)
Creatinine, Ser: 0.76 mg/dL (ref 0.44–1.00)
GFR calc Af Amer: >60 mL/min (ref >60)
Glucose, Bld: 106 mg/dL — ABNORMAL HIGH (ref 70–99)
Potassium: 3.6 mmol/L (ref 3.5–5.1)
Sodium: 137 mmol/L (ref 135–145)
Total Bilirubin: 0.5 mg/dL (ref 0.3–1.2)
Total Protein: 7.5 g/dL (ref 6.5–8.1)

## 2018-01-27 LAB — DIFFERENTIAL
Abs Immature Granulocytes: 0.01 10*3/uL (ref 0.00–0.07)
Basophils Absolute: 0 10*3/uL (ref 0.0–0.1)
Basophils Relative: 0 %
Eosinophils Absolute: 0 10*3/uL (ref 0.0–0.5)
Eosinophils Relative: 1 %
Immature Granulocytes: 0 %
Lymphocytes Relative: 32 %
Lymphs Abs: 1.9 10*3/uL (ref 0.7–4.0)
Monocytes Absolute: 0.4 10*3/uL (ref 0.1–1.0)
Monocytes Relative: 7 %
Neutro Abs: 3.6 10*3/uL (ref 1.7–7.7)
Neutrophils Relative %: 60 %

## 2018-01-27 LAB — I-STAT TROPONIN, ED: Troponin i, poc: 0 ng/mL (ref 0.00–0.08)

## 2018-01-27 MED ORDER — ACETAMINOPHEN 160 MG/5ML PO SOLN: 650.0000 mg | ORAL | Status: DC | PRN

## 2018-01-27 MED ORDER — ATORVASTATIN CALCIUM 80 MG PO TABS
80.0000 mg | ORAL_TABLET | Freq: Every day | ORAL | Status: DC
  Administered 2018-01-28 – 2018-01-29 (×2): 80 mg via ORAL
  Filled 2018-01-27 (×2): qty 1

## 2018-01-27 MED ORDER — LORAZEPAM 2 MG/ML IJ SOLN
1.0000 mg | Freq: Once | INTRAMUSCULAR | Status: AC | PRN
  Administered 2018-01-27: 1 mg via INTRAVENOUS
  Filled 2018-01-27: qty 1

## 2018-01-27 MED ORDER — GABAPENTIN 300 MG PO CAPS: 300.0000 mg | ORAL_CAPSULE | Freq: Three times a day (TID) | ORAL | Status: DC | PRN

## 2018-01-27 MED ORDER — ASPIRIN 81 MG PO CHEW
324.0000 mg | CHEWABLE_TABLET | Freq: Once | ORAL | Status: AC
  Administered 2018-01-27: 324 mg via ORAL
  Filled 2018-01-27: qty 4

## 2018-01-27 MED ORDER — ENOXAPARIN SODIUM 40 MG/0.4ML ~~LOC~~ SOLN
40.0000 mg | Freq: Every day | SUBCUTANEOUS | Status: DC
  Administered 2018-01-28 – 2018-01-29 (×2): 40 mg via SUBCUTANEOUS
  Filled 2018-01-27 (×2): qty 0.4

## 2018-01-27 MED ORDER — ACETAMINOPHEN 325 MG PO TABS
650.0000 mg | ORAL_TABLET | ORAL | Status: DC | PRN
  Administered 2018-01-28 (×2): 650 mg via ORAL
  Filled 2018-01-27 (×2): qty 2

## 2018-01-27 MED ORDER — ASPIRIN EC 325 MG PO TBEC
325.0000 mg | DELAYED_RELEASE_TABLET | Freq: Every day | ORAL | Status: DC
  Administered 2018-01-28: 325 mg via ORAL
  Filled 2018-01-27: qty 1

## 2018-01-27 MED ORDER — ACETAMINOPHEN 650 MG RE SUPP: 650.0000 mg | RECTAL | Status: DC | PRN

## 2018-01-27 MED ORDER — SENNOSIDES-DOCUSATE SODIUM 8.6-50 MG PO TABS: 1.0000 | ORAL_TABLET | Freq: Every evening | ORAL | Status: DC | PRN

## 2018-01-27 MED ORDER — STROKE: EARLY STAGES OF RECOVERY BOOK
Freq: Once | Status: AC
  Administered 2018-01-28

## 2018-01-27 MED ORDER — IOPAMIDOL (ISOVUE-370) INJECTION 76%
INTRAVENOUS | Status: AC
  Filled 2018-01-27: qty 100

## 2018-01-27 MED ORDER — IOPAMIDOL (ISOVUE-370) INJECTION 76%
100.0000 mL | Freq: Once | INTRAVENOUS | Status: AC | PRN
  Administered 2018-01-27: 100 mL via INTRAVENOUS

## 2018-01-27 NOTE — Progress Notes (Signed)
Paged admissions. Made aware of patient's arrival and need of orders.

## 2018-01-27 NOTE — Consult Note (Signed)
Neurology Consultation Reason for Consult: Right arm numbness Referring Physician: Ralene Bathe, E  CC: Right arm numbness  History is obtained from: Patient  HPI: Rossana Molchan Macy is a 78 y.o. female with history of CAD, hyperlipidemia who presents with right arm numbness that started abruptly around 7 AM.  States that she went to work, and then had abrupt onset of paresthesia of the right arm.  She states that she was still able to move it, though she was dropping things from it.  This has gradually improved since the onset of symptoms at bedtime.  She denies any symptoms in her face or leg.  She does endorse high cervical neck pain on the left.  LKW: 7 AM tpa given?: no, outside of window   ROS: A 14 point ROS was performed and is negative except as noted in the HPI.   Past Medical History:  Diagnosis Date  . Coronary artery disease   . Hyperlipidemia      Family History  Family history unknown: Yes     Social History:  reports that she has never smoked. She has never used smokeless tobacco. She reports that she does not drink alcohol or use drugs.   Exam: Current vital signs: BP (!) 155/75   Pulse 62   Temp (!) 97.5 F (36.4 C) (Oral)   Resp 15   Ht 5\' 2"  (1.575 m)   Wt 60.8 kg   SpO2 98%   BMI 24.51 kg/m  Vital signs in last 24 hours: Temp:  [97.5 F (36.4 C)] 97.5 F (36.4 C) (01/03 1235) Pulse Rate:  [62-70] 62 (01/03 1459) Resp:  [15-16] 15 (01/03 1459) BP: (155-166)/(59-75) 155/75 (01/03 1459) SpO2:  [98 %] 98 % (01/03 1459) Weight:  [60.8 kg] 60.8 kg (01/03 1459)   Physical Exam  Constitutional: Appears well-developed and well-nourished.  Psych: Affect appropriate to situation Eyes: No scleral injection HENT: No OP obstrucion Head: Normocephalic.  Cardiovascular: Normal rate and regular rhythm.  Respiratory: Effort normal, non-labored breathing GI: Soft.  No distension. There is no tenderness.  Skin: WDI  Neuro: Mental Status: Patient is awake,  alert, oriented to person, place, month, year, and situation. Patient is able to give a clear and coherent history. No signs of aphasia or neglect Cranial Nerves: II: Visual Fields are full. Pupils are equal, round, and reactive to light.   III,IV, VI: EOMI without ptosis or diploplia.  V: Facial sensation is diminished on the right side to temperature VII: Facial movement is symmetric.  VIII: hearing is intact to voice X: Uvula elevates symmetrically XI: Shoulder shrug is symmetric. XII: tongue is midline without atrophy or fasciculations.  Motor: Tone is normal. Bulk is normal. 5/5 strength was present in all four extremities.  Sensory: She states that cold stimulus feels colder on the right than left, symmetric to light touch DTR: Markedly hyperreflexic with flexion at the elbow when her Hoffmann on the right is checked, there is 3+ reflexes with crossed adductors bilaterally at the knees, downgoing toes and no clonus at the ankles. Cerebellar: No clear ataxia  I have reviewed labs in epic and the results pertinent to this consultation are: CMP-unremarkable  I have reviewed the images obtained: CT head-unremarkable  Impression: 78 year old female with abrupt onset right-sided paresthesia and weakness of her right arm that is gradually improving.  With her neck pain, and her extremely brisk reflexes, I do have concern that her cervical spine may be playing a role in her current symptomatology.  I would favor getting an MRI of the brain and cervical spine to rule out stroke/cervical disease.  If there is no explanation found in her cervical spine, then with her improving symptoms I think that I would favor treating this as TIA.  Recommendations: 1) MRI brain/cervical spine 2) if no explanation on her imaging, then I would favor pursuing stroke/TIA work-up.   Roland Rack, MD [facility] [phone]  If 7pm- 7am, please page neurology on call as listed in  [place].

## 2018-01-27 NOTE — ED Notes (Signed)
Patient transported to CT 

## 2018-01-27 NOTE — ED Provider Notes (Signed)
MRI shows an acute infarct.  Discussed with Dr. Leonel Ramsay, asks for CT angiogram of head and neck and I will also give her aspirin.  She will be admitted to the hospitalist service.   EKG Interpretation  Date/Time:  Friday January 27 2018 12:50:27 EST Ventricular Rate:  68 PR Interval:  148 QRS Duration: 88 QT Interval:  418 QTC Calculation: 444 R Axis:   64 Text Interpretation:  Normal sinus rhythm Nonspecific T wave abnormality Abnormal ECG No significant change since 2012 Confirmed by Sherwood Gambler [phone]) on 01/27/2018 4:14:36 PM        Results for orders placed or performed during the hospital encounter of 01/27/18  Protime-INR  Result Value Ref Range   Prothrombin Time 14.3 11.4 - 15.2 seconds   INR 1.12   APTT  Result Value Ref Range   aPTT 31 24 - 36 seconds  CBC  Result Value Ref Range   WBC 6.1 4.0 - 10.5 K/uL   RBC 4.36 3.87 - 5.11 MIL/uL   Hemoglobin 13.0 12.0 - 15.0 g/dL   HCT 39.5 36.0 - 46.0 %   MCV 90.6 80.0 - 100.0 fL   MCH 29.8 26.0 - 34.0 pg   MCHC 32.9 30.0 - 36.0 g/dL   RDW 12.9 11.5 - 15.5 %   Platelets 246 150 - 400 K/uL   nRBC 0.0 0.0 - 0.2 %  Differential  Result Value Ref Range   Neutrophils Relative % 60 %   Neutro Abs 3.6 1.7 - 7.7 K/uL   Lymphocytes Relative 32 %   Lymphs Abs 1.9 0.7 - 4.0 K/uL   Monocytes Relative 7 %   Monocytes Absolute 0.4 0.1 - 1.0 K/uL   Eosinophils Relative 1 %   Eosinophils Absolute 0.0 0.0 - 0.5 K/uL   Basophils Relative 0 %   Basophils Absolute 0.0 0.0 - 0.1 K/uL   Immature Granulocytes 0 %   Abs Immature Granulocytes 0.01 0.00 - 0.07 K/uL  Comprehensive metabolic panel  Result Value Ref Range   Sodium 137 135 - 145 mmol/L   Potassium 3.6 3.5 - 5.1 mmol/L   Chloride 104 98 - 111 mmol/L   CO2 26 22 - 32 mmol/L   Glucose, Bld 106 (H) 70 - 99 mg/dL   BUN 13 8 - 23 mg/dL   Creatinine, Ser 0.76 0.44 - 1.00 mg/dL   Calcium 9.4 8.9 - 10.3 mg/dL   Total Protein 7.5 6.5 - 8.1 g/dL   Albumin 3.8 3.5 - 5.0  g/dL   AST 21 15 - 41 U/L   ALT 16 0 - 44 U/L   Alkaline Phosphatase 62 38 - 126 U/L   Total Bilirubin 0.5 0.3 - 1.2 mg/dL   GFR calc non Af Amer >60 >60 mL/min   GFR calc Af Amer >60 >60 mL/min   Anion gap 7 5 - 15  I-stat troponin, ED  Result Value Ref Range   Troponin i, poc 0.00 0.00 - 0.08 ng/mL   Comment 3          I-Stat Chem 8, ED  Result Value Ref Range   Sodium 138 135 - 145 mmol/L   Potassium 3.8 3.5 - 5.1 mmol/L   Chloride 103 98 - 111 mmol/L   BUN 16 8 - 23 mg/dL   Creatinine, Ser 0.70 0.44 - 1.00 mg/dL   Glucose, Bld 102 (H) 70 - 99 mg/dL   Calcium, Ion 1.20 1.15 - 1.40 mmol/L   TCO2 27 22 - 32  mmol/L   Hemoglobin 13.3 12.0 - 15.0 g/dL   HCT 39.0 36.0 - 46.0 %   Ct Head Wo Contrast  Result Date: 01/27/2018 CLINICAL DATA:  Right arm weakness starting at 7 a.m. EXAM: CT HEAD WITHOUT CONTRAST TECHNIQUE: Contiguous axial images were obtained from the base of the skull through the vertex without intravenous contrast. COMPARISON:  None FINDINGS: Brain: The brainstem, cerebellum, cerebral peduncles, thalami, basal ganglia, basilar cisterns, and ventricular system appear within normal limits. No intracranial hemorrhage, mass lesion, or acute CVA. Mild periventricular white matter hypodensity suggesting faint chronic microvascular white matter disease. Vascular: There is atherosclerotic calcification of the cavernous carotid arteries bilaterally. Skull: Unremarkable Sinuses/Orbits: Unremarkable Other: No supplemental non-categorized findings. IMPRESSION: 1. No acute intracranial findings. 2. Mild chronic ischemic microvascular white matter disease. 3. Atherosclerosis. Electronically Signed   By: Van Clines M.D.   On: 01/27/2018 13:35   Mr Brain Wo Contrast  Result Date: 01/27/2018 CLINICAL DATA:  Right upper extremity weakness and numbness beginning this morning. Mild occipital headache. EXAM: MRI HEAD WITHOUT CONTRAST MRI CERVICAL SPINE WITHOUT CONTRAST TECHNIQUE:  Multiplanar, multiecho pulse sequences of the brain and surrounding structures, and cervical spine, to include the craniocervical junction and cervicothoracic junction, were obtained without intravenous contrast. COMPARISON:  Head CT 01/27/2018 FINDINGS: MRI HEAD FINDINGS Brain: There are 2 subcentimeter foci of acute cortical infarction in the left perisylvian region. Scattered chronic microhemorrhages are noted in both cerebral and cerebellar hemispheres with sparing of the deep gray nuclei. The ventricles and sulci are normal. Patchy bilateral cerebral white matter T2 hyperintensities are nonspecific but compatible with mild-to-moderate chronic small vessel ischemic disease. Vascular: Major intracranial vascular flow voids are preserved. Skull and upper cervical spine: Unremarkable bone marrow signal. Sinuses/Orbits: Paranasal sinuses and mastoid air cells are clear. Unremarkable orbits. Other: None. MRI CERVICAL SPINE FINDINGS Patient claustrophobia and prominent respiratory motion result in severe motion artifact on multiple sequences. Axial sequences are largely nondiagnostic. Alignment: No significant listhesis. Vertebrae: No fracture or suspicious osseous lesion. Cord: Extremely limited assessment due to motion artifact through the mid and lower cervical cord. Grossly normal signal in the upper cervical and upper thoracic cord. Posterior Fossa, vertebral arteries, paraspinal tissues: Grossly unremarkable. Disc levels: Limited assessment of degenerative changes due to severe motion on axial sequences. There is focally advanced disc degeneration at C5-6 with moderate disc space narrowing and disc bulging possibly resulting in mild spinal stenosis without evidence of cord compression. There is also likely mild disc bulging at C4-5 and C6-7 without gross spinal stenosis. IMPRESSION: 1. Small acute left perisylvian infarcts. 2. Mild-to-moderate chronic small vessel ischemic disease. 3. Severely motion degraded  cervical spine MRI. Limited assessment of disc degeneration as above. Electronically Signed   By: Logan Bores M.D.   On: 01/27/2018 17:46   Mr Cervical Spine Wo Contrast  Result Date: 01/27/2018 CLINICAL DATA:  Right upper extremity weakness and numbness beginning this morning. Mild occipital headache. EXAM: MRI HEAD WITHOUT CONTRAST MRI CERVICAL SPINE WITHOUT CONTRAST TECHNIQUE: Multiplanar, multiecho pulse sequences of the brain and surrounding structures, and cervical spine, to include the craniocervical junction and cervicothoracic junction, were obtained without intravenous contrast. COMPARISON:  Head CT 01/27/2018 FINDINGS: MRI HEAD FINDINGS Brain: There are 2 subcentimeter foci of acute cortical infarction in the left perisylvian region. Scattered chronic microhemorrhages are noted in both cerebral and cerebellar hemispheres with sparing of the deep gray nuclei. The ventricles and sulci are normal. Patchy bilateral cerebral white matter T2 hyperintensities are nonspecific but compatible  with mild-to-moderate chronic small vessel ischemic disease. Vascular: Major intracranial vascular flow voids are preserved. Skull and upper cervical spine: Unremarkable bone marrow signal. Sinuses/Orbits: Paranasal sinuses and mastoid air cells are clear. Unremarkable orbits. Other: None. MRI CERVICAL SPINE FINDINGS Patient claustrophobia and prominent respiratory motion result in severe motion artifact on multiple sequences. Axial sequences are largely nondiagnostic. Alignment: No significant listhesis. Vertebrae: No fracture or suspicious osseous lesion. Cord: Extremely limited assessment due to motion artifact through the mid and lower cervical cord. Grossly normal signal in the upper cervical and upper thoracic cord. Posterior Fossa, vertebral arteries, paraspinal tissues: Grossly unremarkable. Disc levels: Limited assessment of degenerative changes due to severe motion on axial sequences. There is focally advanced  disc degeneration at C5-6 with moderate disc space narrowing and disc bulging possibly resulting in mild spinal stenosis without evidence of cord compression. There is also likely mild disc bulging at C4-5 and C6-7 without gross spinal stenosis. IMPRESSION: 1. Small acute left perisylvian infarcts. 2. Mild-to-moderate chronic small vessel ischemic disease. 3. Severely motion degraded cervical spine MRI. Limited assessment of disc degeneration as above. Electronically Signed   By: Logan Bores M.D.   On: 01/27/2018 17:46      Sherwood Gambler, MD 01/27/18 Einar Crow

## 2018-01-27 NOTE — Progress Notes (Signed)
Chart review note MRI brain positive for left perisylvian strokes. No gross C-spine abnormality although poor quality spine imaging.  IMPRESSION ACUTE ISCHEMIC STROKE  RECS:  -Admit to hospitalist or observation -Telemetry monitoring -Allow for permissive hypertension for the first 24-48h - only treat PRN if SBP >220 mmHg. Blood pressures can be gradually normalized to SBP<140 upon discharge. -MRI brain without contrast -CT Angiogram of Head and neck -Echocardiogram -HgbA1c, fasting lipid panel -Frequent neuro checks -Prophylactic therapy-Antiplatelet med: Aspirin - dose 325mg  PO or 300mg  PR  -Atorvastatin 80 mg PO daily -Risk factor modification -PT consult, OT consult, Speech consult  Please page stroke NP/PA/MD (listed on [facility]ION)  from 8am-4 pm as this patient will be followed by the stroke team at this point.   -- Amie Portland, MD [facility] Pager: [phone] If 7pm to 7am, please call on call as listed on [facility]ION.

## 2018-01-27 NOTE — ED Triage Notes (Signed)
Pt here with stroke symptom right arm, weakness that started at 7 am arm is getting better now but is still heavy

## 2018-01-27 NOTE — ED Provider Notes (Signed)
[facility] Provider Note   CSN: [id number] Arrival date & time: 01/27/18  1231     History   Chief Complaint Chief Complaint  Patient presents with  . Cerebrovascular Accident    HPI Debra Fry is a 78 y.o. female.  The history is provided by the patient. No language interpreter was used.  Cerebrovascular Accident    Debra Fry is a 78 y.o. female who presents to the Emergency Department complaining of weakness. Presents to the emergency department complaining of right upper extremity weakness that began at 7 AM this morning. She was in her routine state of health when she awoke at five this morning. 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 Initially she was having difficulty holding items it was dropping things. She does have a mild occipital headache, no additional symptoms. She has no medical problems and takes no medications. She does not smoke, drink alcohol or use drugs.  She is right hand dominant. Past Medical History:  Diagnosis Date  . Coronary artery disease   . Hyperlipidemia     There are no active problems to display for this patient.   Past Surgical History:  Procedure Laterality Date  . CORONARY ARTERY BYPASS GRAFT       OB History   No obstetric history on file.      Home Medications    Prior to Admission medications   Medication Sig Start Date End Date Taking? Authorizing Provider  gabapentin (NEURONTIN) 300 MG capsule TAKE 2 CAPSULES(600 MG) BY MOUTH THREE TIMES DAILY Patient taking differently: Take 300 mg by mouth as needed (facial nerve pain).  03/14/17  Yes Jaffe, Adam R, DO  tetrahydrozoline-zinc (VISINE-AC) 0.05-0.25 % ophthalmic solution Place 2 drops into both eyes as needed (red eyes).   Yes [provider]    Family History Family History  Family history unknown: Yes    Social History Social History    Tobacco Use  . Smoking status: Never Smoker  . Smokeless tobacco: Never Used  Substance Use Topics  . Alcohol use: No  . Drug use: No     Allergies   Patient has no known allergies.   Review of Systems Review of Systems  All other systems reviewed and are negative.    Physical Exam Updated Vital Signs BP (!) 155/75   Pulse 62   Temp (!) 97.5 F (36.4 C) (Oral)   Resp 15   Ht 5\' 2"  (1.575 m)   Wt 60.8 kg   SpO2 98%   BMI 24.51 kg/m   Physical Exam Vitals signs and nursing note reviewed.  Constitutional:      Appearance: She is well-developed.  HENT:     Head: Normocephalic and atraumatic.  Eyes:     Extraocular Movements: Extraocular movements intact.     Pupils: Pupils are equal, round, and reactive to light.  Neck:     Musculoskeletal: Neck supple.  Cardiovascular:     Rate and Rhythm: Normal rate and regular rhythm.     Heart sounds: No murmur.  Pulmonary:     Effort: Pulmonary effort is normal. No respiratory distress.     Breath sounds: Normal breath sounds.  Abdominal:     Palpations: Abdomen is soft.     Tenderness: There is no abdominal tenderness. There is no guarding or rebound.  Musculoskeletal:  General: No tenderness.     Comments: 2+ radial and DP pulses bilaterally  Skin:    General: Skin is warm and dry.  Neurological:     Mental Status: She is alert and oriented to person, place, and time.     Comments: No asymmetry of facial muscles. There is 3+2 4/5 strength in the right upper extremity with altered sensation to light touch the right upper extremity. Five out of five strength in the left upper extremity and bilateral lower extremities  Psychiatric:        Mood and Affect: Mood normal.        Behavior: Behavior normal.      ED Treatments / Results  Labs (all labs ordered are listed, but only abnormal results are displayed) Labs Reviewed  COMPREHENSIVE METABOLIC PANEL - Abnormal; Notable for the following components:       Result Value   Glucose, Bld 106 (*)    All other components within normal limits  I-STAT CHEM 8, ED - Abnormal; Notable for the following components:   Glucose, Bld 102 (*)    All other components within normal limits  PROTIME-INR  APTT  CBC  DIFFERENTIAL  I-STAT TROPONIN, ED  CBG MONITORING, ED    EKG EKG Interpretation  Date/Time:  Friday January 27 2018 12:50:27 EST Ventricular Rate:  68 PR Interval:  148 QRS Duration: 88 QT Interval:  418 QTC Calculation: 444 R Axis:   64 Text Interpretation:  Normal sinus rhythm Nonspecific T wave abnormality Abnormal ECG No significant change since 2012 Confirmed by Sherwood Gambler [phone]) on 01/27/2018 4:14:36 PM   Radiology Ct Head Wo Contrast  Result Date: 01/27/2018 CLINICAL DATA:  Right arm weakness starting at 7 a.m. EXAM: CT HEAD WITHOUT CONTRAST TECHNIQUE: Contiguous axial images were obtained from the base of the skull through the vertex without intravenous contrast. COMPARISON:  None FINDINGS: Brain: The brainstem, cerebellum, cerebral peduncles, thalami, basal ganglia, basilar cisterns, and ventricular system appear within normal limits. No intracranial hemorrhage, mass lesion, or acute CVA. Mild periventricular white matter hypodensity suggesting faint chronic microvascular white matter disease. Vascular: There is atherosclerotic calcification of the cavernous carotid arteries bilaterally. Skull: Unremarkable Sinuses/Orbits: Unremarkable Other: No supplemental non-categorized findings. IMPRESSION: 1. No acute intracranial findings. 2. Mild chronic ischemic microvascular white matter disease. 3. Atherosclerosis. Electronically Signed   By: Van Clines M.D.   On: 01/27/2018 13:35    Procedures Procedures (including critical care time)  Medications Ordered in ED Medications  LORazepam (ATIVAN) injection 1 mg (has no administration in time range)     Initial Impression / Assessment and Plan / ED Course  I have reviewed  the triage vital signs and the nursing notes.  Pertinent labs & imaging results that were available during my care of the patient were reviewed by me and considered in my medical decision making (see chart for details).     Patient here for evaluation of right upper extremity weakness that began at 7 AM this morning. She does have weakness in altered sensation to light touch in the right upper extremity, no additional deficits. Plan to obtain MRI brain and C-spine to further evaluate. Patient care transferred pending imaging.  Final Clinical Impressions(s) / ED Diagnoses   Final diagnoses:  None    ED Discharge Orders    None       Quintella Reichert, MD 01/27/18 [phone]

## 2018-01-27 NOTE — H&P (Signed)
History and Physical    Debra Fry:[id number] DOB: 01/10/1941 DOA: 01/27/2018  PCP: Patient, No Pcp Per Patient coming from: Home  Chief Complaint: Arm weakness   HPI: Debra Fry is a 78 y.o. female with medical history significant of CAD, hyperlipidemia presenting to the hospital for evaluation of arm weakness.  Patient states she was at work this morning and at around 7 AM noticed acute onset weakness of her right arm.  Her arm became flaccid and numb.  She was also experiencing pain in the back of her head at that time.  Denies having any slurring of speech, vision changes, or weakness/ numbness in her legs.  States her symptoms have now improved after several hours but sensation and strength are not completely back to baseline.  Review of Systems: As per HPI otherwise 10 point review of systems negative.  Past Medical History:  Diagnosis Date  . Coronary artery disease   . Hyperlipidemia     Past Surgical History:  Procedure Laterality Date  . CORONARY ARTERY BYPASS GRAFT       reports that she has never smoked. She has never used smokeless tobacco. She reports that she does not drink alcohol or use drugs.  No Known Allergies  Family History  Family history unknown: Yes    Prior to Admission medications   Medication Sig Start Date End Date Taking? Authorizing Provider  gabapentin (NEURONTIN) 300 MG capsule TAKE 2 CAPSULES(600 MG) BY MOUTH THREE TIMES DAILY Patient taking differently: Take 300 mg by mouth as needed (facial nerve pain).  03/14/17  Yes Jaffe, Adam R, DO  tetrahydrozoline-zinc (VISINE-AC) 0.05-0.25 % ophthalmic solution Place 2 drops into both eyes as needed (red eyes).   Yes [provider]    Physical Exam: Vitals:   01/27/18 1930 01/27/18 2116 01/27/18 2316 01/27/18 2319  BP: (!) 158/69 (!) 160/66 140/65 140/65  Pulse:  63 70 72  Resp: 17     Temp:  (!) 97.5 F (36.4 C) 98.1 F (36.7 C) 98.1 F (36.7 C)  TempSrc:  Oral Oral Oral   SpO2:  95% 95% 96%  Weight:  60.3 kg    Height:  5\' 2"  (1.575 m)      Physical Exam  Constitutional: She is oriented to person, place, and time. She appears well-developed and well-nourished. No distress.  HENT:  Head: Normocephalic.  Mouth/Throat: Oropharynx is clear and moist.  Eyes: Pupils are equal, round, and reactive to light. EOM are normal.  Neck: Neck supple.  Cardiovascular: Normal rate, regular rhythm and intact distal pulses.  Pulmonary/Chest: Effort normal and breath sounds normal. No respiratory distress. She has no wheezes. She has no rales.  Abdominal: Soft. Bowel sounds are normal. She exhibits no distension. There is no abdominal tenderness. There is no guarding.  Musculoskeletal:        General: No edema.  Neurological: She is alert and oriented to person, place, and time. No cranial nerve deficit.  Speech fluent, tongue midline, no facial droop Strength 4 out of 5 in the right upper extremity and 5 out of 5 in the left upper extremity.  Strength 5 out of 5 in bilateral lower extremities.  Sensation to light touch mildly reduced in the right upper extremity but intact elsewhere.  Skin: Skin is warm and dry. She is not diaphoretic.  Psychiatric: She has a normal mood and affect. Her behavior is normal.     Labs on Admission: I have personally reviewed following labs and  imaging studies  CBC: Recent Labs  Lab 01/27/18 1252 01/27/18 1309  WBC 6.1  --   NEUTROABS 3.6  --   HGB 13.0 13.3  HCT 39.5 39.0  MCV 90.6  --   PLT 246  --    Basic Metabolic Panel: Recent Labs  Lab 01/27/18 1252 01/27/18 1309  NA 137 138  K 3.6 3.8  CL 104 103  CO2 26  --   GLUCOSE 106* 102*  BUN 13 16  CREATININE 0.76 0.70  CALCIUM 9.4  --    GFR: Estimated Creatinine Clearance: 52 mL/min (by C-G formula based on SCr of 0.7 mg/dL). Liver Function Tests: Recent Labs  Lab 01/27/18 1252  AST 21  ALT 16  ALKPHOS 62  BILITOT 0.5  PROT 7.5  ALBUMIN 3.8   No results  for input(s): LIPASE, AMYLASE in the last 168 hours. No results for input(s): AMMONIA in the last 168 hours. Coagulation Profile: Recent Labs  Lab 01/27/18 1252  INR 1.12   Cardiac Enzymes: No results for input(s): CKTOTAL, CKMB, CKMBINDEX, TROPONINI in the last 168 hours. BNP (last 3 results) No results for input(s): PROBNP in the last 8760 hours. HbA1C: No results for input(s): HGBA1C in the last 72 hours. CBG: No results for input(s): GLUCAP in the last 168 hours. Lipid Profile: No results for input(s): CHOL, HDL, LDLCALC, TRIG, CHOLHDL, LDLDIRECT in the last 72 hours. Thyroid Function Tests: No results for input(s): TSH, T4TOTAL, FREET4, T3FREE, THYROIDAB in the last 72 hours. Anemia Panel: No results for input(s): VITAMINB12, FOLATE, FERRITIN, TIBC, IRON, RETICCTPCT in the last 72 hours. Urine analysis: No results found for: COLORURINE, APPEARANCEUR, LABSPEC, [place], GLUCOSEU, HGBUR, BILIRUBINUR, KETONESUR, PROTEINUR, UROBILINOGEN, NITRITE, LEUKOCYTESUR  Radiological Exams on Admission: Ct Angio Head W Or Wo Contrast  Result Date: 01/27/2018 CLINICAL DATA:  Right arm weakness beginning 0700 hours with some improvement. MRI showed small acute infarctions at the left frontoparietal junction region. EXAM: CT ANGIOGRAPHY HEAD AND NECK TECHNIQUE: Multidetector CT imaging of the head and neck was performed using the standard protocol during bolus administration of intravenous contrast. Multiplanar CT image reconstructions and MIPs were obtained to evaluate the vascular anatomy. Carotid stenosis measurements (when applicable) are obtained utilizing NASCET criteria, using the distal internal carotid diameter as the denominator. CONTRAST:  138mL ISOVUE-370 IOPAMIDOL (ISOVUE-370) INJECTION 76% COMPARISON:  MRI and CT earlier same day. FINDINGS: CTA NECK FINDINGS Aortic arch: Aortic atherosclerosis. No aneurysm. Atherosclerotic change at the brachiocephalic vessel origins but without flow  limiting stenosis. 40% stenosis of the left subclavian origin. Right carotid system: Common carotid artery widely patent to the bifurcation region. Atherosclerotic disease at the bifurcation and ICA bulb. Minimal diameter of the proximal ICA is 2.5 mm. Compared to a more distal cervical ICA diameter of 4 mm, this indicates a 40% stenosis. ICA widely patent beyond that. Left carotid system: Left common carotid artery widely patent to the bifurcation region. There is advanced and complex atherosclerotic disease at the carotid bifurcation and ICA bulb region. There is marked irregularity which could be a source of emboli. Minimal diameter is difficult to measure accurately but is probably 1 mm. Compared to a more distal cervical ICA diameter of 4.5 mm, this indicates a 75% stenosis. Beyond that, the cervical ICA is widely patent. Vertebral arteries: The left vertebral artery is dominant. Left vertebral artery origin is widely patent in the vessel is widely patent through the cervical region to the foramen magnum. The right vertebral artery origin is stenotic with  narrowing of 70% or greater. Beyond that, the vessel is patent through the cervical region to the foramen magnum. Skeleton: Mild cervical spondylosis. Other neck: No mass or lymphadenopathy. Upper chest: Mild apical scarring.  No infiltrate or mass. Review of the MIP images confirms the above findings CTA HEAD FINDINGS Anterior circulation: Both internal carotid arteries are patent through the skull base. There is siphon atherosclerosis but without flow limiting stenosis. The anterior and middle cerebral vessels are patent without proximal stenosis, aneurysm or vascular malformation. No large or medium vessel occlusion is identified. Posterior circulation: Both vertebral arteries are patent through the foramen magnum to the basilar. No basilar stenosis. Posterior circulation branch vessels are patent. Posterior communicating arteries receive most of there  supply from the anterior circulation. Venous sinuses: Patent and normal. Anatomic variants: None significant. Delayed phase: No abnormal enhancement. Review of the MIP images confirms the above findings IMPRESSION: In this patient with acute infarction at the left frontoparietal junction, the dominant finding is that of advanced and irregular atherosclerotic disease at the left carotid bifurcation and ICA bulb. Minimal diameter in the ICA bulb is approximately 1 mm, consistent with a 75% stenosis. The pronounced irregularity could serve as a source of embolic disease. There is no large or medium vessel intracranial occlusion identifiable at this time. Atherosclerotic disease at the right carotid bifurcation with stenosis of the ICA of 40%. 70% right vertebral artery origin stenosis. That vessel is non dominant. Electronically Signed   By: Nelson Chimes M.D.   On: 01/27/2018 18:49   Ct Head Wo Contrast  Result Date: 01/27/2018 CLINICAL DATA:  Right arm weakness starting at 7 a.m. EXAM: CT HEAD WITHOUT CONTRAST TECHNIQUE: Contiguous axial images were obtained from the base of the skull through the vertex without intravenous contrast. COMPARISON:  None FINDINGS: Brain: The brainstem, cerebellum, cerebral peduncles, thalami, basal ganglia, basilar cisterns, and ventricular system appear within normal limits. No intracranial hemorrhage, mass lesion, or acute CVA. Mild periventricular white matter hypodensity suggesting faint chronic microvascular white matter disease. Vascular: There is atherosclerotic calcification of the cavernous carotid arteries bilaterally. Skull: Unremarkable Sinuses/Orbits: Unremarkable Other: No supplemental non-categorized findings. IMPRESSION: 1. No acute intracranial findings. 2. Mild chronic ischemic microvascular white matter disease. 3. Atherosclerosis. Electronically Signed   By: Van Clines M.D.   On: 01/27/2018 13:35   Ct Angio Neck W And/or Wo Contrast  Result Date:  01/27/2018 CLINICAL DATA:  Right arm weakness beginning 0700 hours with some improvement. MRI showed small acute infarctions at the left frontoparietal junction region. EXAM: CT ANGIOGRAPHY HEAD AND NECK TECHNIQUE: Multidetector CT imaging of the head and neck was performed using the standard protocol during bolus administration of intravenous contrast. Multiplanar CT image reconstructions and MIPs were obtained to evaluate the vascular anatomy. Carotid stenosis measurements (when applicable) are obtained utilizing NASCET criteria, using the distal internal carotid diameter as the denominator. CONTRAST:  155mL ISOVUE-370 IOPAMIDOL (ISOVUE-370) INJECTION 76% COMPARISON:  MRI and CT earlier same day. FINDINGS: CTA NECK FINDINGS Aortic arch: Aortic atherosclerosis. No aneurysm. Atherosclerotic change at the brachiocephalic vessel origins but without flow limiting stenosis. 40% stenosis of the left subclavian origin. Right carotid system: Common carotid artery widely patent to the bifurcation region. Atherosclerotic disease at the bifurcation and ICA bulb. Minimal diameter of the proximal ICA is 2.5 mm. Compared to a more distal cervical ICA diameter of 4 mm, this indicates a 40% stenosis. ICA widely patent beyond that. Left carotid system: Left common carotid artery widely patent to the  bifurcation region. There is advanced and complex atherosclerotic disease at the carotid bifurcation and ICA bulb region. There is marked irregularity which could be a source of emboli. Minimal diameter is difficult to measure accurately but is probably 1 mm. Compared to a more distal cervical ICA diameter of 4.5 mm, this indicates a 75% stenosis. Beyond that, the cervical ICA is widely patent. Vertebral arteries: The left vertebral artery is dominant. Left vertebral artery origin is widely patent in the vessel is widely patent through the cervical region to the foramen magnum. The right vertebral artery origin is stenotic with narrowing  of 70% or greater. Beyond that, the vessel is patent through the cervical region to the foramen magnum. Skeleton: Mild cervical spondylosis. Other neck: No mass or lymphadenopathy. Upper chest: Mild apical scarring.  No infiltrate or mass. Review of the MIP images confirms the above findings CTA HEAD FINDINGS Anterior circulation: Both internal carotid arteries are patent through the skull base. There is siphon atherosclerosis but without flow limiting stenosis. The anterior and middle cerebral vessels are patent without proximal stenosis, aneurysm or vascular malformation. No large or medium vessel occlusion is identified. Posterior circulation: Both vertebral arteries are patent through the foramen magnum to the basilar. No basilar stenosis. Posterior circulation branch vessels are patent. Posterior communicating arteries receive most of there supply from the anterior circulation. Venous sinuses: Patent and normal. Anatomic variants: None significant. Delayed phase: No abnormal enhancement. Review of the MIP images confirms the above findings IMPRESSION: In this patient with acute infarction at the left frontoparietal junction, the dominant finding is that of advanced and irregular atherosclerotic disease at the left carotid bifurcation and ICA bulb. Minimal diameter in the ICA bulb is approximately 1 mm, consistent with a 75% stenosis. The pronounced irregularity could serve as a source of embolic disease. There is no large or medium vessel intracranial occlusion identifiable at this time. Atherosclerotic disease at the right carotid bifurcation with stenosis of the ICA of 40%. 70% right vertebral artery origin stenosis. That vessel is non dominant. Electronically Signed   By: Nelson Chimes M.D.   On: 01/27/2018 18:49   Mr Brain Wo Contrast  Result Date: 01/27/2018 CLINICAL DATA:  Right upper extremity weakness and numbness beginning this morning. Mild occipital headache. EXAM: MRI HEAD WITHOUT CONTRAST MRI  CERVICAL SPINE WITHOUT CONTRAST TECHNIQUE: Multiplanar, multiecho pulse sequences of the brain and surrounding structures, and cervical spine, to include the craniocervical junction and cervicothoracic junction, were obtained without intravenous contrast. COMPARISON:  Head CT 01/27/2018 FINDINGS: MRI HEAD FINDINGS Brain: There are 2 subcentimeter foci of acute cortical infarction in the left perisylvian region. Scattered chronic microhemorrhages are noted in both cerebral and cerebellar hemispheres with sparing of the deep gray nuclei. The ventricles and sulci are normal. Patchy bilateral cerebral white matter T2 hyperintensities are nonspecific but compatible with mild-to-moderate chronic small vessel ischemic disease. Vascular: Major intracranial vascular flow voids are preserved. Skull and upper cervical spine: Unremarkable bone marrow signal. Sinuses/Orbits: Paranasal sinuses and mastoid air cells are clear. Unremarkable orbits. Other: None. MRI CERVICAL SPINE FINDINGS Patient claustrophobia and prominent respiratory motion result in severe motion artifact on multiple sequences. Axial sequences are largely nondiagnostic. Alignment: No significant listhesis. Vertebrae: No fracture or suspicious osseous lesion. Cord: Extremely limited assessment due to motion artifact through the mid and lower cervical cord. Grossly normal signal in the upper cervical and upper thoracic cord. Posterior Fossa, vertebral arteries, paraspinal tissues: Grossly unremarkable. Disc levels: Limited assessment of degenerative changes due to  severe motion on axial sequences. There is focally advanced disc degeneration at C5-6 with moderate disc space narrowing and disc bulging possibly resulting in mild spinal stenosis without evidence of cord compression. There is also likely mild disc bulging at C4-5 and C6-7 without gross spinal stenosis. IMPRESSION: 1. Small acute left perisylvian infarcts. 2. Mild-to-moderate chronic small vessel  ischemic disease. 3. Severely motion degraded cervical spine MRI. Limited assessment of disc degeneration as above. Electronically Signed   By: Logan Bores M.D.   On: 01/27/2018 17:46   Mr Cervical Spine Wo Contrast  Result Date: 01/27/2018 CLINICAL DATA:  Right upper extremity weakness and numbness beginning this morning. Mild occipital headache. EXAM: MRI HEAD WITHOUT CONTRAST MRI CERVICAL SPINE WITHOUT CONTRAST TECHNIQUE: Multiplanar, multiecho pulse sequences of the brain and surrounding structures, and cervical spine, to include the craniocervical junction and cervicothoracic junction, were obtained without intravenous contrast. COMPARISON:  Head CT 01/27/2018 FINDINGS: MRI HEAD FINDINGS Brain: There are 2 subcentimeter foci of acute cortical infarction in the left perisylvian region. Scattered chronic microhemorrhages are noted in both cerebral and cerebellar hemispheres with sparing of the deep gray nuclei. The ventricles and sulci are normal. Patchy bilateral cerebral white matter T2 hyperintensities are nonspecific but compatible with mild-to-moderate chronic small vessel ischemic disease. Vascular: Major intracranial vascular flow voids are preserved. Skull and upper cervical spine: Unremarkable bone marrow signal. Sinuses/Orbits: Paranasal sinuses and mastoid air cells are clear. Unremarkable orbits. Other: None. MRI CERVICAL SPINE FINDINGS Patient claustrophobia and prominent respiratory motion result in severe motion artifact on multiple sequences. Axial sequences are largely nondiagnostic. Alignment: No significant listhesis. Vertebrae: No fracture or suspicious osseous lesion. Cord: Extremely limited assessment due to motion artifact through the mid and lower cervical cord. Grossly normal signal in the upper cervical and upper thoracic cord. Posterior Fossa, vertebral arteries, paraspinal tissues: Grossly unremarkable. Disc levels: Limited assessment of degenerative changes due to severe motion on  axial sequences. There is focally advanced disc degeneration at C5-6 with moderate disc space narrowing and disc bulging possibly resulting in mild spinal stenosis without evidence of cord compression. There is also likely mild disc bulging at C4-5 and C6-7 without gross spinal stenosis. IMPRESSION: 1. Small acute left perisylvian infarcts. 2. Mild-to-moderate chronic small vessel ischemic disease. 3. Severely motion degraded cervical spine MRI. Limited assessment of disc degeneration as above. Electronically Signed   By: Logan Bores M.D.   On: 01/27/2018 17:46    EKG: Independently reviewed.  Sinus rhythm, nonspecific T wave abnormality, and baseline wander.  No prior EKG for comparison.  Assessment/Plan Principal Problem:   Acute CVA (cerebrovascular accident) ([place])   Acute CVA Patient is presenting with a complaint of acute onset right arm weakness and numbness.  Deficits improved at this time.  MRI showing small acute left perisylvian infarcts.  CTA with evidence of advanced and irregular atherosclerotic disease at the left carotid bifurcation and ICA bulb ; 75% stenosis of the ICA bulb.  The pronounced irregularity could serve as a possible source of embolic disease.  Also showing atherosclerotic disease at the right carotid bifurcation with stenosis of the ICA of 40%.  Showing 70% right vertebral artery origin stenosis. -Patient was seen by neurology -Cardiac monitoring -Allow permissive hypertension for the first 24 to 48 hours.  Only treat PRN if SBP >220. -Echo -Check lipid panel, A1c -Frequent neurochecks -Aspirin 325 mg daily -Lipitor 80 mg daily -Risk factor modification -PT/OT/SLP  History of CAD Not on any home medications.  No prior cardiology  notes in the chart.  No cath or stress test results seen. -Start aspirin -Check lipid panel  History of hyperlipidemia Not on any home medications. -Start high-dose statin -Lipid panel  DVT prophylaxis: Lovenox Code Status:  Patient wishes to be full code. Family Communication: Daughter at bedside. Disposition Plan: Anticipate discharge in 1 to 2 days. Consults called: Neurology Admission status: Observation   Shela Leff MD [facility] Pager [phone]  If 7PM-7AM, please contact night-coverage [email or web address] Password [facility]  01/27/2018, 11:32 PM

## 2018-01-27 NOTE — ED Notes (Signed)
Patient transported to MRI 

## 2018-01-28 ENCOUNTER — Observation Stay (HOSPITAL_COMMUNITY): Payer: Medicare HMO

## 2018-01-28 ENCOUNTER — Observation Stay (HOSPITAL_BASED_OUTPATIENT_CLINIC_OR_DEPARTMENT_OTHER): Payer: Medicare HMO

## 2018-01-28 DIAGNOSIS — R7303 Prediabetes: Secondary | ICD-10-CM | POA: Diagnosis present

## 2018-01-28 DIAGNOSIS — I10 Essential (primary) hypertension: Secondary | ICD-10-CM | POA: Diagnosis present

## 2018-01-28 DIAGNOSIS — I340 Nonrheumatic mitral (valve) insufficiency: Secondary | ICD-10-CM

## 2018-01-28 DIAGNOSIS — Z79899 Other long term (current) drug therapy: Secondary | ICD-10-CM | POA: Diagnosis not present

## 2018-01-28 DIAGNOSIS — E785 Hyperlipidemia, unspecified: Secondary | ICD-10-CM | POA: Diagnosis present

## 2018-01-28 DIAGNOSIS — R51 Headache: Secondary | ICD-10-CM | POA: Diagnosis present

## 2018-01-28 DIAGNOSIS — I2510 Atherosclerotic heart disease of native coronary artery without angina pectoris: Secondary | ICD-10-CM | POA: Diagnosis present

## 2018-01-28 DIAGNOSIS — I639 Cerebral infarction, unspecified: Secondary | ICD-10-CM | POA: Diagnosis present

## 2018-01-28 DIAGNOSIS — R531 Weakness: Secondary | ICD-10-CM | POA: Diagnosis present

## 2018-01-28 DIAGNOSIS — Z951 Presence of aortocoronary bypass graft: Secondary | ICD-10-CM | POA: Diagnosis not present

## 2018-01-28 DIAGNOSIS — R29898 Other symptoms and signs involving the musculoskeletal system: Secondary | ICD-10-CM | POA: Diagnosis not present

## 2018-01-28 DIAGNOSIS — I6523 Occlusion and stenosis of bilateral carotid arteries: Secondary | ICD-10-CM | POA: Diagnosis present

## 2018-01-28 DIAGNOSIS — E876 Hypokalemia: Secondary | ICD-10-CM | POA: Diagnosis not present

## 2018-01-28 DIAGNOSIS — I6522 Occlusion and stenosis of left carotid artery: Secondary | ICD-10-CM | POA: Diagnosis not present

## 2018-01-28 LAB — CBC WITH DIFFERENTIAL/PLATELET
Abs Immature Granulocytes: 0.01 10*3/uL (ref 0.00–0.07)
Basophils Absolute: 0 10*3/uL (ref 0.0–0.1)
Basophils Relative: 0 %
Eosinophils Absolute: 0.1 10*3/uL (ref 0.0–0.5)
Eosinophils Relative: 2 %
HCT: 39.2 % (ref 36.0–46.0)
Hemoglobin: 12.9 g/dL (ref 12.0–15.0)
Immature Granulocytes: 0 %
Lymphocytes Relative: 29 %
Lymphs Abs: 1.4 10*3/uL (ref 0.7–4.0)
MCH: 28.8 pg (ref 26.0–34.0)
MCHC: 32.9 g/dL (ref 30.0–36.0)
MCV: 87.5 fL (ref 80.0–100.0)
Monocytes Absolute: 0.4 10*3/uL (ref 0.1–1.0)
Monocytes Relative: 9 %
Neutro Abs: 2.9 10*3/uL (ref 1.7–7.7)
Neutrophils Relative %: 60 %
Platelets: 211 10*3/uL (ref 150–400)
RBC: 4.48 MIL/uL (ref 3.87–5.11)
RDW: 12.8 % (ref 11.5–15.5)
WBC: 4.7 10*3/uL (ref 4.0–10.5)
nRBC: 0 % (ref 0.0–0.2)

## 2018-01-28 LAB — COMPREHENSIVE METABOLIC PANEL
ALT: 18 U/L (ref 0–44)
AST: 21 U/L (ref 15–41)
Albumin: 3.5 g/dL (ref 3.5–5.0)
Alkaline Phosphatase: 57 U/L (ref 38–126)
Anion gap: 8 (ref 5–15)
BUN: 16 mg/dL (ref 8–23)
CO2: 23 mmol/L (ref 22–32)
Calcium: 9.1 mg/dL (ref 8.9–10.3)
Chloride: 106 mmol/L (ref 98–111)
Creatinine, Ser: 0.73 mg/dL (ref 0.44–1.00)
GFR calc Af Amer: >60 mL/min (ref >60)
GFR calc non Af Amer: >60 mL/min (ref >60)
Glucose, Bld: 108 mg/dL — ABNORMAL HIGH (ref 70–99)
Potassium: 3.5 mmol/L (ref 3.5–5.1)
Sodium: 137 mmol/L (ref 135–145)
TOTAL PROTEIN: 7.4 g/dL (ref 6.5–8.1)
Total Bilirubin: 1 mg/dL (ref 0.3–1.2)

## 2018-01-28 LAB — ECHOCARDIOGRAM COMPLETE
Height: 62 in
Weight: 2127 oz

## 2018-01-28 LAB — MAGNESIUM: Magnesium: 1.8 mg/dL (ref 1.7–2.4)

## 2018-01-28 LAB — HEMOGLOBIN A1C
Hgb A1c MFr Bld: 5.8 % — ABNORMAL HIGH (ref 4.8–5.6)
Mean Plasma Glucose: 119.76 mg/dL

## 2018-01-28 LAB — LIPID PANEL
Cholesterol: 234 mg/dL — ABNORMAL HIGH (ref 0–200)
HDL: 39 mg/dL — ABNORMAL LOW (ref >40)
LDL Cholesterol: 169 mg/dL — ABNORMAL HIGH (ref 0–99)
Total CHOL/HDL Ratio: 6 RATIO
Triglycerides: 131 mg/dL (ref <150)
VLDL: 26 mg/dL (ref 0–40)

## 2018-01-28 LAB — PHOSPHORUS: Phosphorus: 3.5 mg/dL (ref 2.5–4.6)

## 2018-01-28 MED ORDER — CLOPIDOGREL BISULFATE 75 MG PO TABS
75.0000 mg | ORAL_TABLET | Freq: Every day | ORAL | Status: DC
  Administered 2018-01-28 – 2018-01-29 (×2): 75 mg via ORAL
  Filled 2018-01-28 (×2): qty 1

## 2018-01-28 MED ORDER — ASPIRIN 81 MG PO CHEW
81.0000 mg | CHEWABLE_TABLET | Freq: Every day | ORAL | Status: DC
  Administered 2018-01-28 – 2018-01-29 (×2): 81 mg via ORAL
  Filled 2018-01-28 (×2): qty 1

## 2018-01-28 NOTE — Progress Notes (Signed)
STROKE TEAM PROGRESS NOTE   HISTORY OF PRESENT ILLNESS (per record) Debra Fry is a 78 y.o. female with history of CAD and hyperlipidemia who presents with right arm numbness that started abruptly around 7 AM.  States that she went to work, and then had abrupt onset of paresthesia of the right arm.  She states that she was still able to move it, though she was dropping things from it. This has gradually improved since the onset of symptoms at bedtime.  She denies any symptoms in her face or leg.  She does endorse high cervical neck pain on the left.  LKW: 7 AM tpa given?: no, outside of window   SUBJECTIVE (INTERVAL HISTORY) Her RN is at the bedside.  Patient denies any prior history of TIA, strokes or known carotid stenosis.   OBJECTIVE Vitals:   01/28/18 0007 01/28/18 0410 01/28/18 0900 01/28/18 1213  BP: (!) 125/54 125/63 126/77 (!) 113/42  Pulse: 66 71 60 70  Resp:  18 20 16   Temp:  97.9 F (36.6 C) 97.8 F (36.6 C) 98.3 F (36.8 C)  TempSrc:  Oral Oral Oral  SpO2: 95% 97% 95% 95%  Weight:      Height:        CBC:  Recent Labs  Lab 01/27/18 1252 01/27/18 1309 01/28/18 1013  WBC 6.1  --  4.7  NEUTROABS 3.6  --  2.9  HGB 13.0 13.3 12.9  HCT 39.5 39.0 39.2  MCV 90.6  --  87.5  PLT 246  --  254    Basic Metabolic Panel:  Recent Labs  Lab 01/27/18 1252 01/27/18 1309 01/28/18 1013  NA 137 138 137  K 3.6 3.8 3.5  CL 104 103 106  CO2 26  --  23  GLUCOSE 106* 102* 108*  BUN 13 16 16   CREATININE 0.76 0.70 0.73  CALCIUM 9.4  --  9.1  MG  --   --  1.8  PHOS  --   --  3.5    Lipid Panel:     Component Value Date/Time   CHOL 234 (H) 01/28/2018 0546   TRIG 131 01/28/2018 0546   HDL 39 (L) 01/28/2018 0546   CHOLHDL 6.0 01/28/2018 0546   VLDL 26 01/28/2018 0546   LDLCALC 169 (H) 01/28/2018 0546   HgbA1c:  Lab Results  Component Value Date   HGBA1C 5.8 (H) 01/28/2018   Urine Drug Screen: No results found for: LABOPIA, COCAINSCRNUR, LABBENZ,  AMPHETMU, THCU, LABBARB  Alcohol Level No results found for: ETH  IMAGING  Ct Angio Head W Or Wo Contrast Ct Angio Neck W And/or Wo Contrast 01/27/2018 IMPRESSION:  In this patient with acute infarction at the left frontoparietal junction, the dominant finding is that of advanced and irregular atherosclerotic disease at the left carotid bifurcation and ICA bulb. Minimal diameter in the ICA bulb is approximately 1 mm, consistent with a 75% stenosis. The pronounced irregularity could serve as a source of embolic disease. There is no large or medium vessel intracranial occlusion identifiable at this time. Atherosclerotic disease at the right carotid bifurcation with stenosis of the ICA of 40%. 70% right vertebral artery origin stenosis. That vessel is non dominant.    Ct Head Wo Contrast 01/27/2018 IMPRESSION:  1. No acute intracranial findings.  2. Mild chronic ischemic microvascular white matter disease.  3. Atherosclerosis.    Mr Brain Wo Contrast 01/27/2018 IMPRESSION:  1. Small acute left perisylvian infarcts.  2. Mild-to-moderate chronic small vessel ischemic  disease.     Mr Cervical Spine Wo Contrast  01/27/2018 Severely motion degraded cervical spine MRI. Limited assessment of disc degeneration.    Transthoracic Echocardiogram  01/28/2018 Study Conclusions - Left ventricle: The cavity size was normal. Wall thickness was   increased in a pattern of mild LVH. Systolic function was normal.   The estimated ejection fraction was in the range of 55% to 60%.   Wall motion was normal; there were no regional wall motion   abnormalities. - Mitral valve: Calcified annulus. There was mild regurgitation. Impressions: - No cardiac source of emboli was indentified.    Bilateral Carotid Dopplers  00/00/00 Pending    PHYSICAL EXAM Blood pressure (!) 113/42, pulse 70, temperature 98.3 F (36.8 C), temperature source Oral, resp. rate 16, height 5\' 2"  (1.575 m), weight 60.3 kg, SpO2 95  %.  Pleasant elderly Caucasian lady currently not in distress. . Afebrile. Head is nontraumatic. Neck is supple without bruit.    Cardiac exam no murmur or gallop. Lungs are clear to auscultation. Distal pulses are well felt.  Neurological Exam ;  Awake  Alert oriented x 3. Normal speech and language.eye movements full without nystagmus.fundi were not visualized. Vision acuity and fields appear normal. Hearing is normal. Palatal movements are normal. Face symmetric. Tongue midline. Normal strength, tone, reflexes and coordination. Normal sensation. Gait deferred.   ASSESSMENT/PLAN Debra Fry is a 78 y.o. female with history of CAD and hyperlipidemia who presents with right arm numbness. She did not receive IV t-PA due to late presentation.   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  Resultant  No residual deficits  CT head - no acute findings  MRI head - Small acute left perisylvian infarcts.   MRI C Spine - Severely motion degraded. Limited assessment of disc degeneration.  MRA head - not performed  CTA H&N - acute infarction at the left frontoparietal junction - left carotid stenosis 75%. Non dominant Rt VA - 70%.  Carotid Doppler - pending  2D Echo - EF 55 - 60%. No cardiac source of emboli identified.   LDL - 169  HgbA1c - 5.8  UDS - not ordered  VTE prophylaxis - Lovenox  Diet  - Heart healthy with thin liquids.  No antithrombotic prior to admission, now on aspirin 325 mg daily  Patient counseled to be compliant with her antithrombotic medications  Ongoing aggressive stroke risk factor management  Therapy recommendations: No F/U PT recommended  Disposition:  Pending  Hypertension  Stable . Permissive hypertension (OK if < 220/120) but gradually normalize in 5-7 days . Long-term BP goal normotensive  Hyperlipidemia  Lipid lowering medication PTA:  none  LDL 169, goal < 70  Current lipid  lowering medication: Lipitor 80 mg daily  Continue statin at discharge   Other Stroke Risk Factors  Advanced age  Coronary artery disease  Other Active Problems  Left carotid stenosis - consult vascular surgery     Hospital day # 0 She presented with right sided weakness likely from left MCA branch infarct symptomatic from proximal left carotid stenosis. Check carotid ultrasound and if greater than 60% stenosis is confirmed a vascular surgery consult for elective left carotid endarterectomy over the next 1-2 weeks. Recommend aspirin and Plavix for 3 weeks followed by aspirin alone. Continue ongoing stroke workup and aggressive risk factor modification. Greater than 50% time during this 35 minute visit was spent on counseling and coordination of care  about her stroke and discussion about symptomatically stenosis and treatment options and answering questions. Antony Contras, MD Medical Director [facility] Pager: [phone] 01/28/2018 4:44 PM To contact Stroke Continuity provider, please refer to [email or web address] After hours, contact General Neurology

## 2018-01-28 NOTE — Discharge Summary (Signed)
Physician Discharge Summary  Debra Fry XAJ:[id number] DOB: March 16, 1940 DOA: 01/27/2018  PCP: Patient, No Pcp Per  Admit date: 01/27/2018 Discharge date: 01/29/2018  Admitted From: Home Disposition: Home  Recommendations for Outpatient Follow-up:  1. Follow up with PCP in 1-2 weeks 2. Follow up with Neurology in 4-6 weeks; Per Neurology Continue ASA and Plavix for 3 weeks followed by Aspirin alone  3. Follow up with Vascular Surgery  4. Please obtain CMP/CBC, Mag, Phos in one week 5. Please follow up on the following pending results:  Home Health: No Equipment/Devices: None recommended by PT  Discharge Condition: Stable  CODE STATUS: FULL CODE  Diet recommendation: Heart Healthy Diet  Brief/Interim Summary: HPI per Dr. Shela Leff on 01/27/2018 Debra Fry is a 78 y.o. female with medical history significant of CAD, hyperlipidemia presenting to the hospital for evaluation of arm weakness.  Patient states she was at work this morning and at around 7 AM noticed acute onset weakness of her right arm.  Her arm became flaccid and numb.  She was also experiencing pain in the back of her head at that time.  Denies having any slurring of speech, vision changes, or weakness/ numbness in her legs.  States her symptoms have now improved after several hours but sensation and strength are not completely back to baseline.  **She improved and was evaluated by neurology.  Work-up was done and did show some left ICA stenosis.  Because of the left ICA stenosis she had a carotid Doppler done which confirmed.  Vascular surgery was consulted for further evaluation and she will be undergoing elective carotid endarterectomy in the coming weeks.  She is deemed medically stable to be discharged and hospitalization has been complicated by hypomagnesemia and hypokalemia which is all been repleted -She will need to follow-up with PCP, neurology and vascular surgery in the coming weeks.  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 Deficits improved at this time. -Head CT w/o Contrast done and showed -MRIBrain w/o Contrastshowing small acute left perisylvian infarcts. -CTAof the Head and Neck was read as "In this patient with acute infarction at the left frontoparietal junction, the dominant finding is that of advanced and irregular atherosclerotic disease at the left carotid bifurcation and ICA bulb. Minimal diameter in the ICA bulb is approximately 1 mm, consistent with a 75% stenosis. The pronounced irregularity could serve as a source of embolic disease. There is no large or medium vessel intracranial occlusion identifiable at this time. Atherosclerotic disease at the right carotid bifurcation withstenosis of the ICA of 40%.70% right vertebral artery origin stenosis. That vessel is non dominant." -Cardotid Dopplers done due to above findings and showed nd showed Preliminary report:1-39% right ICA stenosis, highest end of scale. 60-79% left ICA stenosis, highest end of scale (349/94). Bilateral vertebral artery flow is antegrade -Patient was seen by Neurology -Will consult Vascular Surgery for Elective Carotid Endarterectomy in the next 1-2 weeks -Placed on ASA and Statin as well as Clopidogrel and Neurology recommending c/w ASA and Plavix for 3 weeks and then just ASA alone -ContinuedCardiac monitoringwith Telemetry -Allow permissive hypertension for the first 24 to 48 hours.Only treat PRN if SBP >220. -ECHOCardiogram done and showed EF of 55-60% with wall motion that was normal and no regional wall motion abnormalities. The mitralshowed a calcified annulus and  there is mild mitral regurgitation and overall there is no cardiac source of emboli that was identified -Checked FLP and HbA1c as  Below -Frequent neurochecks -Aspirin 325 mg dailyand Atorvastatin80 mg daily -Risk factor modification -PT/OT recommend no Follow Up -Consulted Vascular Surgery for further evaluation and recommendations and they discussed left carotid endarterectomy with the patient and she is agreeable so Dr. Oneida Alar will contact the office tomorrow for scheduling -She will need to follow-up with vascular surgery as well as neurology -Per vascular surgery she needs an arterial line to be from the right and blood pressure needs to be taken on the right given her left ICA stenosis   HTN -Allow for Permissive HTN -BP was 158/62 -Gradually normalize in 5-7 Days  History of CAD s/p CABG -Not on any home medications.   -No prior cardiology notes in the chart.   -No cath or stress test results seen. -Lipid Panel as below -Follow up and C/w ASA and Plavix for now and then follow upw ith PCP as an outpatient   Hyperlipidemia -Lipid Panel showed lustral/HDL ratio 6.0, cholesterol level of 234, HDL level 39, LDL of 169, triglycerides of 131, and VLDL of 26 -Continue with Atorvastatin 80 mg q Daily  IFG/Pre-Diabetes -Patient's HbA1c was 5.8 -Blood Gluose levels ranging from 102-108 on BMP/CMP -Dietary Counseling given  Hypokalemia -Patient's potassium this morning was 2.8 this AM -Replete with p.o. potassium chloride 40 mg twice daily x2 doses as well as IV potassium chloride 40 mEQ -Repeat this Afternoon was 4.8 -Continue monitor replete as necessary -Repeat CMP in outpatient setting  Hypomagnesemia -Patient magnesium level is 1.5 this morn -Replete with IV mag sulfate 2 g and repeat this Afternoon was 2.2 -Continue to Monitor and Replete as Necessary -Repeat magnesium level in outpatient setting  Discharge Instructions Discharge Instructions    Ambulatory referral to Neurology   Complete by:  As directed    An appointment is requested in approximately: 6 weeks for Stroke follow up   Call  MD for:  difficulty breathing, headache or visual disturbances   Complete by:  As directed    Call MD for:  extreme fatigue   Complete by:  As directed    Call MD for:  hives   Complete by:  As directed    Call MD for:  persistant dizziness or light-headedness   Complete by:  As directed    Call MD for:  persistant nausea and vomiting   Complete by:  As directed    Call MD for:  redness, tenderness, or signs of infection (pain, swelling, redness, odor or green/yellow discharge around incision site)   Complete by:  As directed    Call MD for:  severe uncontrolled pain   Complete by:  As directed    Call MD for:  temperature >100.4   Complete by:  As directed    Diet - low sodium heart healthy   Complete by:  As directed    Discharge instructions   Complete by:  As directed    You were cared for by a hospitalist during your hospital stay. If you have any questions about your discharge medications or the care you received while you were in the hospital after you are discharged, you can call the unit and ask to speak with the hospitalist on call if the hospitalist that took care of you is not available. Once you are discharged, your primary care physician will handle any further medical issues. Please note that NO  REFILLS for any discharge medications will be authorized once you are discharged, as it is imperative that you return to your primary care physician (or establish a relationship with a primary care physician if you do not have one) for your aftercare needs so that they can reassess your need for medications and monitor your lab values.  Follow up with PCP, Neurology, and Cardiology. Take all medications as prescribed. If symptoms change or worsen please return to the ED for evaluation   Increase activity slowly   Complete by:  As directed      Allergies as of 01/29/2018   No Known Allergies     Medication List    TAKE these medications   aspirin 81 MG chewable tablet Chew 1  tablet (81 mg total) by mouth daily. Start taking on:  January 30, 2018   atorvastatin 80 MG tablet Commonly known as:  LIPITOR Take 1 tablet (80 mg total) by mouth daily at 6 PM.   clopidogrel 75 MG tablet Commonly known as:  PLAVIX Take 1 tablet (75 mg total) by mouth daily. Start taking on:  January 30, 2018   gabapentin 300 MG capsule Commonly known as:  NEURONTIN TAKE 2 CAPSULES(600 MG) BY MOUTH THREE TIMES DAILY What changed:  See the new instructions.   tetrahydrozoline-zinc 0.05-0.25 % ophthalmic solution Commonly known as:  VISINE-AC Place 2 drops into both eyes as needed (red eyes).       No Known Allergies  Consultations:  Neurology Stroke Team  Procedures/Studies: Ct Angio Head W Or Wo Contrast  Result Date: 01/27/2018 CLINICAL DATA:  Right arm weakness beginning 0700 hours with some improvement. MRI showed small acute infarctions at the left frontoparietal junction region. EXAM: CT ANGIOGRAPHY HEAD AND NECK TECHNIQUE: Multidetector CT imaging of the head and neck was performed using the standard protocol during bolus administration of intravenous contrast. Multiplanar CT image reconstructions and MIPs were obtained to evaluate the vascular anatomy. Carotid stenosis measurements (when applicable) are obtained utilizing NASCET criteria, using the distal internal carotid diameter as the denominator. CONTRAST:  172mL ISOVUE-370 IOPAMIDOL (ISOVUE-370) INJECTION 76% COMPARISON:  MRI and CT earlier same day. FINDINGS: CTA NECK FINDINGS Aortic arch: Aortic atherosclerosis. No aneurysm. Atherosclerotic change at the brachiocephalic vessel origins but without flow limiting stenosis. 40% stenosis of the left subclavian origin. Right carotid system: Common carotid artery widely patent to the bifurcation region. Atherosclerotic disease at the bifurcation and ICA bulb. Minimal diameter of the proximal ICA is 2.5 mm. Compared to a more distal cervical ICA diameter of 4 mm, this indicates  a 40% stenosis. ICA widely patent beyond that. Left carotid system: Left common carotid artery widely patent to the bifurcation region. There is advanced and complex atherosclerotic disease at the carotid bifurcation and ICA bulb region. There is marked irregularity which could be a source of emboli. Minimal diameter is difficult to measure accurately but is probably 1 mm. Compared to a more distal cervical ICA diameter of 4.5 mm, this indicates a 75% stenosis. Beyond that, the cervical ICA is widely patent. Vertebral arteries: The left vertebral artery is dominant. Left vertebral artery origin is widely patent in the vessel is widely patent through the cervical region to the foramen magnum. The right vertebral artery origin is stenotic with narrowing of 70% or greater. Beyond that, the vessel is patent through the cervical region to the foramen magnum. Skeleton: Mild cervical spondylosis. Other neck: No mass or lymphadenopathy. Upper chest: Mild apical scarring.  No infiltrate or mass.  Review of the MIP images confirms the above findings CTA HEAD FINDINGS Anterior circulation: Both internal carotid arteries are patent through the skull base. There is siphon atherosclerosis but without flow limiting stenosis. The anterior and middle cerebral vessels are patent without proximal stenosis, aneurysm or vascular malformation. No large or medium vessel occlusion is identified. Posterior circulation: Both vertebral arteries are patent through the foramen magnum to the basilar. No basilar stenosis. Posterior circulation branch vessels are patent. Posterior communicating arteries receive most of there supply from the anterior circulation. Venous sinuses: Patent and normal. Anatomic variants: None significant. Delayed phase: No abnormal enhancement. Review of the MIP images confirms the above findings IMPRESSION: In this patient with acute infarction at the left frontoparietal junction, the dominant finding is that of  advanced and irregular atherosclerotic disease at the left carotid bifurcation and ICA bulb. Minimal diameter in the ICA bulb is approximately 1 mm, consistent with a 75% stenosis. The pronounced irregularity could serve as a source of embolic disease. There is no large or medium vessel intracranial occlusion identifiable at this time. Atherosclerotic disease at the right carotid bifurcation with stenosis of the ICA of 40%. 70% right vertebral artery origin stenosis. That vessel is non dominant. Electronically Signed   By: Nelson Chimes M.D.   On: 01/27/2018 18:49   Ct Head Wo Contrast  Result Date: 01/27/2018 CLINICAL DATA:  Right arm weakness starting at 7 a.m. EXAM: CT HEAD WITHOUT CONTRAST TECHNIQUE: Contiguous axial images were obtained from the base of the skull through the vertex without intravenous contrast. COMPARISON:  None FINDINGS: Brain: The brainstem, cerebellum, cerebral peduncles, thalami, basal ganglia, basilar cisterns, and ventricular system appear within normal limits. No intracranial hemorrhage, mass lesion, or acute CVA. Mild periventricular white matter hypodensity suggesting faint chronic microvascular white matter disease. Vascular: There is atherosclerotic calcification of the cavernous carotid arteries bilaterally. Skull: Unremarkable Sinuses/Orbits: Unremarkable Other: No supplemental non-categorized findings. IMPRESSION: 1. No acute intracranial findings. 2. Mild chronic ischemic microvascular white matter disease. 3. Atherosclerosis. Electronically Signed   By: Van Clines M.D.   On: 01/27/2018 13:35   Ct Angio Neck W And/or Wo Contrast  Result Date: 01/27/2018 CLINICAL DATA:  Right arm weakness beginning 0700 hours with some improvement. MRI showed small acute infarctions at the left frontoparietal junction region. EXAM: CT ANGIOGRAPHY HEAD AND NECK TECHNIQUE: Multidetector CT imaging of the head and neck was performed using the standard protocol during bolus administration  of intravenous contrast. Multiplanar CT image reconstructions and MIPs were obtained to evaluate the vascular anatomy. Carotid stenosis measurements (when applicable) are obtained utilizing NASCET criteria, using the distal internal carotid diameter as the denominator. CONTRAST:  153mL ISOVUE-370 IOPAMIDOL (ISOVUE-370) INJECTION 76% COMPARISON:  MRI and CT earlier same day. FINDINGS: CTA NECK FINDINGS Aortic arch: Aortic atherosclerosis. No aneurysm. Atherosclerotic change at the brachiocephalic vessel origins but without flow limiting stenosis. 40% stenosis of the left subclavian origin. Right carotid system: Common carotid artery widely patent to the bifurcation region. Atherosclerotic disease at the bifurcation and ICA bulb. Minimal diameter of the proximal ICA is 2.5 mm. Compared to a more distal cervical ICA diameter of 4 mm, this indicates a 40% stenosis. ICA widely patent beyond that. Left carotid system: Left common carotid artery widely patent to the bifurcation region. There is advanced and complex atherosclerotic disease at the carotid bifurcation and ICA bulb region. There is marked irregularity which could be a source of emboli. Minimal diameter is difficult to measure accurately but is probably 1  mm. Compared to a more distal cervical ICA diameter of 4.5 mm, this indicates a 75% stenosis. Beyond that, the cervical ICA is widely patent. Vertebral arteries: The left vertebral artery is dominant. Left vertebral artery origin is widely patent in the vessel is widely patent through the cervical region to the foramen magnum. The right vertebral artery origin is stenotic with narrowing of 70% or greater. Beyond that, the vessel is patent through the cervical region to the foramen magnum. Skeleton: Mild cervical spondylosis. Other neck: No mass or lymphadenopathy. Upper chest: Mild apical scarring.  No infiltrate or mass. Review of the MIP images confirms the above findings CTA HEAD FINDINGS Anterior  circulation: Both internal carotid arteries are patent through the skull base. There is siphon atherosclerosis but without flow limiting stenosis. The anterior and middle cerebral vessels are patent without proximal stenosis, aneurysm or vascular malformation. No large or medium vessel occlusion is identified. Posterior circulation: Both vertebral arteries are patent through the foramen magnum to the basilar. No basilar stenosis. Posterior circulation branch vessels are patent. Posterior communicating arteries receive most of there supply from the anterior circulation. Venous sinuses: Patent and normal. Anatomic variants: None significant. Delayed phase: No abnormal enhancement. Review of the MIP images confirms the above findings IMPRESSION: In this patient with acute infarction at the left frontoparietal junction, the dominant finding is that of advanced and irregular atherosclerotic disease at the left carotid bifurcation and ICA bulb. Minimal diameter in the ICA bulb is approximately 1 mm, consistent with a 75% stenosis. The pronounced irregularity could serve as a source of embolic disease. There is no large or medium vessel intracranial occlusion identifiable at this time. Atherosclerotic disease at the right carotid bifurcation with stenosis of the ICA of 40%. 70% right vertebral artery origin stenosis. That vessel is non dominant. Electronically Signed   By: Nelson Chimes M.D.   On: 01/27/2018 18:49   Mr Brain Wo Contrast  Result Date: 01/27/2018 CLINICAL DATA:  Right upper extremity weakness and numbness beginning this morning. Mild occipital headache. EXAM: MRI HEAD WITHOUT CONTRAST MRI CERVICAL SPINE WITHOUT CONTRAST TECHNIQUE: Multiplanar, multiecho pulse sequences of the brain and surrounding structures, and cervical spine, to include the craniocervical junction and cervicothoracic junction, were obtained without intravenous contrast. COMPARISON:  Head CT 01/27/2018 FINDINGS: MRI HEAD FINDINGS Brain:  There are 2 subcentimeter foci of acute cortical infarction in the left perisylvian region. Scattered chronic microhemorrhages are noted in both cerebral and cerebellar hemispheres with sparing of the deep gray nuclei. The ventricles and sulci are normal. Patchy bilateral cerebral white matter T2 hyperintensities are nonspecific but compatible with mild-to-moderate chronic small vessel ischemic disease. Vascular: Major intracranial vascular flow voids are preserved. Skull and upper cervical spine: Unremarkable bone marrow signal. Sinuses/Orbits: Paranasal sinuses and mastoid air cells are clear. Unremarkable orbits. Other: None. MRI CERVICAL SPINE FINDINGS Patient claustrophobia and prominent respiratory motion result in severe motion artifact on multiple sequences. Axial sequences are largely nondiagnostic. Alignment: No significant listhesis. Vertebrae: No fracture or suspicious osseous lesion. Cord: Extremely limited assessment due to motion artifact through the mid and lower cervical cord. Grossly normal signal in the upper cervical and upper thoracic cord. Posterior Fossa, vertebral arteries, paraspinal tissues: Grossly unremarkable. Disc levels: Limited assessment of degenerative changes due to severe motion on axial sequences. There is focally advanced disc degeneration at C5-6 with moderate disc space narrowing and disc bulging possibly resulting in mild spinal stenosis without evidence of cord compression. There is also likely mild disc bulging  at C4-5 and C6-7 without gross spinal stenosis. IMPRESSION: 1. Small acute left perisylvian infarcts. 2. Mild-to-moderate chronic small vessel ischemic disease. 3. Severely motion degraded cervical spine MRI. Limited assessment of disc degeneration as above. Electronically Signed   By: Logan Bores M.D.   On: 01/27/2018 17:46   Mr Cervical Spine Wo Contrast  Result Date: 01/27/2018 CLINICAL DATA:  Right upper extremity weakness and numbness beginning this morning.  Mild occipital headache. EXAM: MRI HEAD WITHOUT CONTRAST MRI CERVICAL SPINE WITHOUT CONTRAST TECHNIQUE: Multiplanar, multiecho pulse sequences of the brain and surrounding structures, and cervical spine, to include the craniocervical junction and cervicothoracic junction, were obtained without intravenous contrast. COMPARISON:  Head CT 01/27/2018 FINDINGS: MRI HEAD FINDINGS Brain: There are 2 subcentimeter foci of acute cortical infarction in the left perisylvian region. Scattered chronic microhemorrhages are noted in both cerebral and cerebellar hemispheres with sparing of the deep gray nuclei. The ventricles and sulci are normal. Patchy bilateral cerebral white matter T2 hyperintensities are nonspecific but compatible with mild-to-moderate chronic small vessel ischemic disease. Vascular: Major intracranial vascular flow voids are preserved. Skull and upper cervical spine: Unremarkable bone marrow signal. Sinuses/Orbits: Paranasal sinuses and mastoid air cells are clear. Unremarkable orbits. Other: None. MRI CERVICAL SPINE FINDINGS Patient claustrophobia and prominent respiratory motion result in severe motion artifact on multiple sequences. Axial sequences are largely nondiagnostic. Alignment: No significant listhesis. Vertebrae: No fracture or suspicious osseous lesion. Cord: Extremely limited assessment due to motion artifact through the mid and lower cervical cord. Grossly normal signal in the upper cervical and upper thoracic cord. Posterior Fossa, vertebral arteries, paraspinal tissues: Grossly unremarkable. Disc levels: Limited assessment of degenerative changes due to severe motion on axial sequences. There is focally advanced disc degeneration at C5-6 with moderate disc space narrowing and disc bulging possibly resulting in mild spinal stenosis without evidence of cord compression. There is also likely mild disc bulging at C4-5 and C6-7 without gross spinal stenosis. IMPRESSION: 1. Small acute left  perisylvian infarcts. 2. Mild-to-moderate chronic small vessel ischemic disease. 3. Severely motion degraded cervical spine MRI. Limited assessment of disc degeneration as above. Electronically Signed   By: Logan Bores M.D.   On: 01/27/2018 17:46   Vas US Carotid  Result Date: 01/29/2018 Carotid Arterial Duplex Study Indications:       CVA and Numbness.40% right ICA stenosis and 75% left ICA                    stenosis by CTA. Risk Factors:      Hyperlipidemia, coronary artery disease. Comparison Study:  No prior study on file for comparison Performing Technologist: Sharion Dove RVS  Examination Guidelines: A complete evaluation includes B-mode imaging, spectral Doppler, color Doppler, and power Doppler as needed of all accessible portions of each vessel. Bilateral testing is considered an integral part of a complete examination. Limited examinations for reoccurring indications may be performed as noted.  Right Carotid Findings: +----------+--------+--------+--------+--------+------------------+           PSV cm/sEDV cm/sStenosisDescribeComments           +----------+--------+--------+--------+--------+------------------+ CCA Prox  92      13                      intimal thickening +----------+--------+--------+--------+--------+------------------+ CCA Distal83      19                      intimal thickening +----------+--------+--------+--------+--------+------------------+ ICA Prox  123  35      1-39%   calcific                   +----------+--------+--------+--------+--------+------------------+ ICA Distal103     26                                         +----------+--------+--------+--------+--------+------------------+ ECA                               calcific                   +----------+--------+--------+--------+--------+------------------+ +----------+--------+-------+--------+-------------------+           PSV cm/sEDV cmsDescribeArm Pressure  (mmHG) +----------+--------+-------+--------+-------------------+ [id number]                                        +----------+--------+-------+--------+-------------------+ +---------+--------+--+--------+--+ VertebralPSV cm/s58EDV cm/s16 +---------+--------+--+--------+--+  Left Carotid Findings: +----------+-------+--------+--------+-----------------------+-----------------+           PSV    EDV cm/sStenosisDescribe               Comments                    cm/s                                                            +----------+-------+--------+--------+-----------------------+-----------------+ CCA Prox  101    27                                     intimal                                                                   thickening        +----------+-------+--------+--------+-----------------------+-----------------+ CCA Distal90     25                                     intimal                                                                   thickening        +----------+-------+--------+--------+-----------------------+-----------------+ ICA Prox  349    94      60-79%  calcific and           Shadowing  heterogenous                             +----------+-------+--------+--------+-----------------------+-----------------+ ICA Mid   272    64                                                       +----------+-------+--------+--------+-----------------------+-----------------+ ICA Distal108    22                                                       +----------+-------+--------+--------+-----------------------+-----------------+ ECA       256    26              calcific                                 +----------+-------+--------+--------+-----------------------+-----------------+ +----------+--------+--------+--------+-------------------+ SubclavianPSV cm/sEDV  cm/sDescribeArm Pressure (mmHG) +----------+--------+--------+--------+-------------------+           100                                         +----------+--------+--------+--------+-------------------+ +---------+--------+--+--------+--+ VertebralPSV cm/s98EDV cm/s29 +---------+--------+--+--------+--+  Summary: Right Carotid: Velocities in the right ICA are consistent with a 1-39% stenosis,                highest end of scale. Left Carotid: Velocities in the left ICA are consistent with a 60-79% stenosis,               highest end of scale. Vertebrals:  Bilateral vertebral arteries demonstrate antegrade flow. Subclavians: Normal flow hemodynamics were seen in bilateral subclavian              arteries. *See table(s) above for measurements and observations.  Electronically signed by Antony Contras MD on 01/29/2018 at 2:10:06 PM.    Final    ECHOCARDIOGRAM ------------------------------------------------------------------- Study Conclusions  - Left ventricle: The cavity size was normal. Wall thickness was   increased in a pattern of mild LVH. Systolic function was normal.   The estimated ejection fraction was in the range of 55% to 60%.   Wall motion was normal; there were no regional wall motion   abnormalities. - Mitral valve: Calcified annulus. There was mild regurgitation.  Impressions:  - No cardiac source of emboli was indentified.  Subjective: Family at bedside was doing well. Chest pain, lightheadedness or dizziness.  No nausea or vomiting.  No other concerns or complaints at this time and ready to go home.  Understands that she will need to follow-up with vascular surgery Dr. Oneida Alar and Neurology as an outpatient.  Discharge Exam: Vitals:   01/29/18 1106 01/29/18 1517  BP: (!) 143/65 (!) 158/62  Pulse: 72 62  Resp: 16 16  Temp: 97.9 F (36.6 C) 98.2 F (36.8 C)  SpO2: 98% 99%   Vitals:   01/29/18 0406 01/29/18 0735 01/29/18 1106 01/29/18 1517  BP: (!) 149/53  (!) 135/51 (!) 143/65 (!) 158/62  Pulse: (!) 59 66 72 62  Resp:  16 16 16   Temp: 98.3 F (36.8 C) 97.6 F (36.4 C) 97.9 F (36.6 C) 98.2 F (36.8 C)  TempSrc: Oral Oral Oral Oral  SpO2: 95% 96% 98% 99%  Weight:      Height:       General: Pt is alert, awake, not in acute distress Cardiovascular: RRR, S1/S2 +, no rubs, no gallops Respiratory: CTA bilaterally, no wheezing, no rhonchi Abdominal: Soft, NT, ND, bowel sounds + Extremities: no edema, no cyanosis  The results of significant diagnostics from this hospitalization (including imaging, microbiology, ancillary and laboratory) are listed below for reference.    Microbiology: No results found for this or any previous visit (from the past 240 hour(s)).   Labs: BNP (last 3 results) No results for input(s): BNP in the last 8760 hours. Basic Metabolic Panel: Recent Labs  Lab 01/27/18 1252 01/27/18 1309 01/28/18 1013 01/29/18 0512 01/29/18 1434  NA 137 138 137 140 136  K 3.6 3.8 3.5 2.8* 4.8  CL 104 103 106 107 107  CO2 26  --  23 26 23   GLUCOSE 106* 102* 108* 170* 97  BUN 13 16 16  6* 18  CREATININE 0.76 0.70 0.73 0.48 0.61  CALCIUM 9.4  --  9.1 8.3* 9.0  MG  --   --  1.8 1.5* 2.2  PHOS  --   --  3.5 3.6  --    Liver Function Tests: Recent Labs  Lab 01/27/18 1252 01/28/18 1013 01/29/18 0512  AST 21 21 15   ALT 16 18 9   ALKPHOS 62 57 74  BILITOT 0.5 1.0 0.1*  PROT 7.5 7.4 7.2  ALBUMIN 3.8 3.5 1.8*   No results for input(s): LIPASE, AMYLASE in the last 168 hours. No results for input(s): AMMONIA in the last 168 hours. CBC: Recent Labs  Lab 01/27/18 1252 01/27/18 1309 01/28/18 1013 01/29/18 0642  WBC 6.1  --  4.7 4.0  NEUTROABS 3.6  --  2.9 2.0  HGB 13.0 13.3 12.9 13.0  HCT 39.5 39.0 39.2 39.3  MCV 90.6  --  87.5 88.9  PLT 246  --  211 211   Cardiac Enzymes: No results for input(s): CKTOTAL, CKMB, CKMBINDEX, TROPONINI in the last 168 hours. BNP: Invalid input(s): POCBNP CBG: No results for  input(s): GLUCAP in the last 168 hours. D-Dimer No results for input(s): DDIMER in the last 72 hours. Hgb A1c Recent Labs    01/28/18 0546  HGBA1C 5.8*   Lipid Profile Recent Labs    01/28/18 0546  CHOL 234*  HDL 39*  LDLCALC 169*  TRIG 131  CHOLHDL 6.0   Thyroid function studies No results for input(s): TSH, T4TOTAL, T3FREE, THYROIDAB in the last 72 hours.  Invalid input(s): FREET3 Anemia work up No results for input(s): VITAMINB12, FOLATE, FERRITIN, TIBC, IRON, RETICCTPCT in the last 72 hours. Urinalysis No results found for: COLORURINE, APPEARANCEUR, LABSPEC, [place], GLUCOSEU, HGBUR, BILIRUBINUR, KETONESUR, PROTEINUR, UROBILINOGEN, NITRITE, LEUKOCYTESUR Sepsis Labs Invalid input(s): PROCALCITONIN,  WBC,  LACTICIDVEN Microbiology No results found for this or any previous visit (from the past 240 hour(s)).  Time coordinating discharge: 35 minutes  SIGNED:  Kerney Elbe, DO [facility] 01/29/2018, 3:52 PM Pager is on [phone]  If 7PM-7AM, please contact night-coverage [email or web address] Password [email or web address]

## 2018-01-28 NOTE — Progress Notes (Signed)
*  PRELIMINARY RESULTS* Echocardiogram 2D Echocardiogram has been performed.  Leavy Cella 01/28/2018, 7:56 AM

## 2018-01-28 NOTE — Progress Notes (Signed)
PROGRESS NOTE    Debra Fry  FVC:[id number] DOB: 12-Mar-1940 DOA: 01/27/2018 PCP: Patient, No Pcp Per  Brief Narrative:  HPI per Dr. Shela Leff on 01/27/2018 Lorrene Reid Trueis a 78 y.o.femalewith medical history significant ofCAD, hyperlipidemia presenting to the hospital for evaluation of arm weakness. Patient states she was at work this morning and at around 7 AM noticed acute onset weakness of her right arm. Her arm became flaccid and numb. She was also experiencing pain in the back of her head at that time. Denies having any slurring of speech, vision changes, or weakness/ numbness in her legs. States her symptoms have now improved after several hours but sensation and strength are not completely back to baseline. Workup done and found to have carotid stenosis (symptomatic) on the Left so will consult Vascular Surgery in AM.   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 Deficits improved at this time.  -Head CT w/o Contrast done and showed -MRI Brain w/o Contrast showing small acute left perisylvian infarcts.  -CTA of the Head and Neck was read as "In this patient with acute infarction at the left frontoparietal junction, the dominant finding is that of advanced and irregular atherosclerotic disease at the left carotid bifurcation and ICA bulb. Minimal diameter in the ICA bulb is approximately 1 mm, consistent with a 75% stenosis. The pronounced irregularity could serve as a source of embolic disease. There is no large or medium vessel intracranial occlusion identifiable at this time. Atherosclerotic disease at the right carotid bifurcation withstenosis of the ICA of 40%. 70% right vertebral artery origin stenosis. That vessel is non dominant." -Cardotid Dopplers done due to above  findings and showed nd showed Preliminary report:  1-39% right ICA stenosis, highest end of scale.  60-79% left ICA stenosis, highest end of scale (349/94).  Bilateral vertebral artery flow is antegrade -Patient was seen by Neurology -Will consult Vascular Surgery for Elective Carotid Endarterectomy in the next 1-2 weeks -Placed on ASA and Statin as well as Clopidogrel -Continued Cardiac monitoring with Telemetry  -Allow permissive hypertension for the first 24 to 48 hours.Only treat PRN if SBP >220. -ECHOCardiogram done and showed EF of 55-60% with wall motion that was normal and no regional wall motion abnormalities. The mitral showed a calcified annulus and there is mild mitral regurgitation and overall there is no cardiac source of emboli that was identified -Checked FLP and HbA1c as Below -Frequent neurochecks -Aspirin 325 mg daily and Atorvastatin 80 mg daily -Risk factor modification -PT/OT recommend no Follow Up -Will Consult Vascular Surgery for further evaluation and recommendations in the AM  HTN -Allow for Permissive HTN -Gradually normalize in 5-7 Days  History ofCAD -Not on any home medications.  -No prior cardiology notes in the chart.  -No cath or stress test results seen. -Lipid Panel as below -C/w   Hyperlipidemia -Lipid Panel showed lustral/HDL ratio 6.0, cholesterol level of 234, HDL level 39, LDL of 169, triglycerides of 131, and VLDL of 26 -Continue with Atorvastatin 80 mg qHS  IFG/Pre-Diabetes -Patient's HbA1c was 5.8 -Blood Gluose levels ranging from 102-108 on BMP/CMP -Dietary Counseling given  DVT prophylaxis: Enoxaparin 40 mg sq q24h Code Status: FULL CODE Family Communication: Discussed with Daughter at bedside Disposition Plan: Pending Vascular Evaluation  Consultants:   Neurology Stroke Team  Procedures:  ECHOCARDIOGRAM ------------------------------------------------------------------- Study Conclusions  - Left ventricle: The  cavity size was normal. Wall thickness was   increased in a pattern of mild LVH. Systolic function was normal.   The estimated ejection fraction was in the range of 55% to 60%.   Wall motion was normal; there were no regional wall motion   abnormalities. - Mitral valve: Calcified annulus. There was mild regurgitation.  Impressions:  - No cardiac source of emboli was indentified.    Antimicrobials:  Anti-infectives (From admission, onward)   None     Subjective: Seen and examined at bedside states she is feeling better.  No chest pain, lightheadedness or dizziness.  States that the right arm numbness has gradually improved.  No nausea or vomiting.  No other concerns or complaints at this time  Objective: Vitals:   01/27/18 2319 01/28/18 0000 01/28/18 0007 01/28/18 0410  BP: 140/65 (!) 125/54 (!) 125/54 125/63  Pulse: 72 64 66 71  Resp:  16  18  Temp: 98.1 F (36.7 C)   97.9 F (36.6 C)  TempSrc: Oral   Oral  SpO2: 96% 96% 95% 97%  Weight:      Height:       No intake or output data in the 24 hours ending 01/28/18 0751 Filed Weights   01/27/18 1459 01/27/18 2116  Weight: 60.8 kg 60.3 kg    Examination: Physical Exam:  Constitutional: WN/WD Caucasian female NAD and appears calm and comfortable awoken from sleep Eyes:  Lids and conjunctivae normal, sclerae anicteric  ENMT: External Ears, Nose appear normal. Grossly normal hearing. Mucous membranes are moist.  Neck: Appears normal, supple, no cervical masses, normal ROM, no appreciable thyromegaly; no JVD Respiratory: Clear to auscultation bilaterally, no wheezing, rales, rhonchi or crackles. Normal respiratory effort and patient is not tachypenic. No accessory muscle use.  Cardiovascular: RRR, no murmurs / rubs / gallops. S1 and S2 auscultated. No extremity edema.  Abdomen: Soft, non-tender, Distended. No masses palpated. No appreciable hepatosplenomegaly. Bowel sounds positive.  GU: Deferred. Musculoskeletal: No  clubbing / cyanosis of digits/nails. No joint deformity upper and lower extremities.  Skin: No rashes, lesions, ulcers on a limited skin evaluation. No induration; Warm and dry.  Neurologic: CN 2-12 grossly intact with no focal deficits. . Romberg sign and cerebellar reflexes not assessed.  Psychiatric: Normal judgment and insight. Alert and oriented x 3. Normal mood and appropriate affect.   Data Reviewed: I have personally reviewed following labs and imaging studies  CBC: Recent Labs  Lab 01/27/18 1252 01/27/18 1309  WBC 6.1  --   NEUTROABS 3.6  --   HGB 13.0 13.3  HCT 39.5 39.0  MCV 90.6  --   PLT 246  --    Basic Metabolic Panel: Recent Labs  Lab 01/27/18 1252 01/27/18 1309  NA 137 138  K 3.6 3.8  CL 104 103  CO2 26  --   GLUCOSE 106* 102*  BUN 13 16  CREATININE 0.76 0.70  CALCIUM 9.4  --    GFR: Estimated Creatinine Clearance: 52 mL/min (by C-G formula based on SCr of 0.7 mg/dL). Liver Function Tests: Recent Labs  Lab 01/27/18 1252  AST 21  ALT 16  ALKPHOS 62  BILITOT 0.5  PROT 7.5  ALBUMIN 3.8   No results for input(s): LIPASE, AMYLASE in the last 168 hours. No results for input(s): AMMONIA in the last 168 hours. Coagulation Profile: Recent Labs  Lab 01/27/18 1252  INR 1.12   Cardiac Enzymes:  No results for input(s): CKTOTAL, CKMB, CKMBINDEX, TROPONINI in the last 168 hours. BNP (last 3 results) No results for input(s): PROBNP in the last 8760 hours. HbA1C: Recent Labs    01/28/18 0546  HGBA1C 5.8*   CBG: No results for input(s): GLUCAP in the last 168 hours. Lipid Profile: Recent Labs    01/28/18 0546  CHOL 234*  HDL 39*  LDLCALC 169*  TRIG 131  CHOLHDL 6.0   Thyroid Function Tests: No results for input(s): TSH, T4TOTAL, FREET4, T3FREE, THYROIDAB in the last 72 hours. Anemia Panel: No results for input(s): VITAMINB12, FOLATE, FERRITIN, TIBC, IRON, RETICCTPCT in the last 72 hours. Sepsis Labs: No results for input(s): PROCALCITON,  LATICACIDVEN in the last 168 hours.  No results found for this or any previous visit (from the past 240 hour(s)).   Radiology Studies: Ct Angio Head W Or Wo Contrast  Result Date: 01/27/2018 CLINICAL DATA:  Right arm weakness beginning 0700 hours with some improvement. MRI showed small acute infarctions at the left frontoparietal junction region. EXAM: CT ANGIOGRAPHY HEAD AND NECK TECHNIQUE: Multidetector CT imaging of the head and neck was performed using the standard protocol during bolus administration of intravenous contrast. Multiplanar CT image reconstructions and MIPs were obtained to evaluate the vascular anatomy. Carotid stenosis measurements (when applicable) are obtained utilizing NASCET criteria, using the distal internal carotid diameter as the denominator. CONTRAST:  157mL ISOVUE-370 IOPAMIDOL (ISOVUE-370) INJECTION 76% COMPARISON:  MRI and CT earlier same day. FINDINGS: CTA NECK FINDINGS Aortic arch: Aortic atherosclerosis. No aneurysm. Atherosclerotic change at the brachiocephalic vessel origins but without flow limiting stenosis. 40% stenosis of the left subclavian origin. Right carotid system: Common carotid artery widely patent to the bifurcation region. Atherosclerotic disease at the bifurcation and ICA bulb. Minimal diameter of the proximal ICA is 2.5 mm. Compared to a more distal cervical ICA diameter of 4 mm, this indicates a 40% stenosis. ICA widely patent beyond that. Left carotid system: Left common carotid artery widely patent to the bifurcation region. There is advanced and complex atherosclerotic disease at the carotid bifurcation and ICA bulb region. There is marked irregularity which could be a source of emboli. Minimal diameter is difficult to measure accurately but is probably 1 mm. Compared to a more distal cervical ICA diameter of 4.5 mm, this indicates a 75% stenosis. Beyond that, the cervical ICA is widely patent. Vertebral arteries: The left vertebral artery is dominant.  Left vertebral artery origin is widely patent in the vessel is widely patent through the cervical region to the foramen magnum. The right vertebral artery origin is stenotic with narrowing of 70% or greater. Beyond that, the vessel is patent through the cervical region to the foramen magnum. Skeleton: Mild cervical spondylosis. Other neck: No mass or lymphadenopathy. Upper chest: Mild apical scarring.  No infiltrate or mass. Review of the MIP images confirms the above findings CTA HEAD FINDINGS Anterior circulation: Both internal carotid arteries are patent through the skull base. There is siphon atherosclerosis but without flow limiting stenosis. The anterior and middle cerebral vessels are patent without proximal stenosis, aneurysm or vascular malformation. No large or medium vessel occlusion is identified. Posterior circulation: Both vertebral arteries are patent through the foramen magnum to the basilar. No basilar stenosis. Posterior circulation branch vessels are patent. Posterior communicating arteries receive most of there supply from the anterior circulation. Venous sinuses: Patent and normal. Anatomic variants: None significant. Delayed phase: No abnormal enhancement. Review of the MIP images confirms the above findings IMPRESSION: In  this patient with acute infarction at the left frontoparietal junction, the dominant finding is that of advanced and irregular atherosclerotic disease at the left carotid bifurcation and ICA bulb. Minimal diameter in the ICA bulb is approximately 1 mm, consistent with a 75% stenosis. The pronounced irregularity could serve as a source of embolic disease. There is no large or medium vessel intracranial occlusion identifiable at this time. Atherosclerotic disease at the right carotid bifurcation with stenosis of the ICA of 40%. 70% right vertebral artery origin stenosis. That vessel is non dominant. Electronically Signed   By: Nelson Chimes M.D.   On: 01/27/2018 18:49   Ct  Head Wo Contrast  Result Date: 01/27/2018 CLINICAL DATA:  Right arm weakness starting at 7 a.m. EXAM: CT HEAD WITHOUT CONTRAST TECHNIQUE: Contiguous axial images were obtained from the base of the skull through the vertex without intravenous contrast. COMPARISON:  None FINDINGS: Brain: The brainstem, cerebellum, cerebral peduncles, thalami, basal ganglia, basilar cisterns, and ventricular system appear within normal limits. No intracranial hemorrhage, mass lesion, or acute CVA. Mild periventricular white matter hypodensity suggesting faint chronic microvascular white matter disease. Vascular: There is atherosclerotic calcification of the cavernous carotid arteries bilaterally. Skull: Unremarkable Sinuses/Orbits: Unremarkable Other: No supplemental non-categorized findings. IMPRESSION: 1. No acute intracranial findings. 2. Mild chronic ischemic microvascular white matter disease. 3. Atherosclerosis. Electronically Signed   By: Van Clines M.D.   On: 01/27/2018 13:35   Ct Angio Neck W And/or Wo Contrast  Result Date: 01/27/2018 CLINICAL DATA:  Right arm weakness beginning 0700 hours with some improvement. MRI showed small acute infarctions at the left frontoparietal junction region. EXAM: CT ANGIOGRAPHY HEAD AND NECK TECHNIQUE: Multidetector CT imaging of the head and neck was performed using the standard protocol during bolus administration of intravenous contrast. Multiplanar CT image reconstructions and MIPs were obtained to evaluate the vascular anatomy. Carotid stenosis measurements (when applicable) are obtained utilizing NASCET criteria, using the distal internal carotid diameter as the denominator. CONTRAST:  165mL ISOVUE-370 IOPAMIDOL (ISOVUE-370) INJECTION 76% COMPARISON:  MRI and CT earlier same day. FINDINGS: CTA NECK FINDINGS Aortic arch: Aortic atherosclerosis. No aneurysm. Atherosclerotic change at the brachiocephalic vessel origins but without flow limiting stenosis. 40% stenosis of the left  subclavian origin. Right carotid system: Common carotid artery widely patent to the bifurcation region. Atherosclerotic disease at the bifurcation and ICA bulb. Minimal diameter of the proximal ICA is 2.5 mm. Compared to a more distal cervical ICA diameter of 4 mm, this indicates a 40% stenosis. ICA widely patent beyond that. Left carotid system: Left common carotid artery widely patent to the bifurcation region. There is advanced and complex atherosclerotic disease at the carotid bifurcation and ICA bulb region. There is marked irregularity which could be a source of emboli. Minimal diameter is difficult to measure accurately but is probably 1 mm. Compared to a more distal cervical ICA diameter of 4.5 mm, this indicates a 75% stenosis. Beyond that, the cervical ICA is widely patent. Vertebral arteries: The left vertebral artery is dominant. Left vertebral artery origin is widely patent in the vessel is widely patent through the cervical region to the foramen magnum. The right vertebral artery origin is stenotic with narrowing of 70% or greater. Beyond that, the vessel is patent through the cervical region to the foramen magnum. Skeleton: Mild cervical spondylosis. Other neck: No mass or lymphadenopathy. Upper chest: Mild apical scarring.  No infiltrate or mass. Review of the MIP images confirms the above findings CTA HEAD FINDINGS Anterior circulation: Both  internal carotid arteries are patent through the skull base. There is siphon atherosclerosis but without flow limiting stenosis. The anterior and middle cerebral vessels are patent without proximal stenosis, aneurysm or vascular malformation. No large or medium vessel occlusion is identified. Posterior circulation: Both vertebral arteries are patent through the foramen magnum to the basilar. No basilar stenosis. Posterior circulation branch vessels are patent. Posterior communicating arteries receive most of there supply from the anterior circulation. Venous  sinuses: Patent and normal. Anatomic variants: None significant. Delayed phase: No abnormal enhancement. Review of the MIP images confirms the above findings IMPRESSION: In this patient with acute infarction at the left frontoparietal junction, the dominant finding is that of advanced and irregular atherosclerotic disease at the left carotid bifurcation and ICA bulb. Minimal diameter in the ICA bulb is approximately 1 mm, consistent with a 75% stenosis. The pronounced irregularity could serve as a source of embolic disease. There is no large or medium vessel intracranial occlusion identifiable at this time. Atherosclerotic disease at the right carotid bifurcation with stenosis of the ICA of 40%. 70% right vertebral artery origin stenosis. That vessel is non dominant. Electronically Signed   By: Nelson Chimes M.D.   On: 01/27/2018 18:49   Mr Brain Wo Contrast  Result Date: 01/27/2018 CLINICAL DATA:  Right upper extremity weakness and numbness beginning this morning. Mild occipital headache. EXAM: MRI HEAD WITHOUT CONTRAST MRI CERVICAL SPINE WITHOUT CONTRAST TECHNIQUE: Multiplanar, multiecho pulse sequences of the brain and surrounding structures, and cervical spine, to include the craniocervical junction and cervicothoracic junction, were obtained without intravenous contrast. COMPARISON:  Head CT 01/27/2018 FINDINGS: MRI HEAD FINDINGS Brain: There are 2 subcentimeter foci of acute cortical infarction in the left perisylvian region. Scattered chronic microhemorrhages are noted in both cerebral and cerebellar hemispheres with sparing of the deep gray nuclei. The ventricles and sulci are normal. Patchy bilateral cerebral white matter T2 hyperintensities are nonspecific but compatible with mild-to-moderate chronic small vessel ischemic disease. Vascular: Major intracranial vascular flow voids are preserved. Skull and upper cervical spine: Unremarkable bone marrow signal. Sinuses/Orbits: Paranasal sinuses and mastoid  air cells are clear. Unremarkable orbits. Other: None. MRI CERVICAL SPINE FINDINGS Patient claustrophobia and prominent respiratory motion result in severe motion artifact on multiple sequences. Axial sequences are largely nondiagnostic. Alignment: No significant listhesis. Vertebrae: No fracture or suspicious osseous lesion. Cord: Extremely limited assessment due to motion artifact through the mid and lower cervical cord. Grossly normal signal in the upper cervical and upper thoracic cord. Posterior Fossa, vertebral arteries, paraspinal tissues: Grossly unremarkable. Disc levels: Limited assessment of degenerative changes due to severe motion on axial sequences. There is focally advanced disc degeneration at C5-6 with moderate disc space narrowing and disc bulging possibly resulting in mild spinal stenosis without evidence of cord compression. There is also likely mild disc bulging at C4-5 and C6-7 without gross spinal stenosis. IMPRESSION: 1. Small acute left perisylvian infarcts. 2. Mild-to-moderate chronic small vessel ischemic disease. 3. Severely motion degraded cervical spine MRI. Limited assessment of disc degeneration as above. Electronically Signed   By: Logan Bores M.D.   On: 01/27/2018 17:46   Mr Cervical Spine Wo Contrast  Result Date: 01/27/2018 CLINICAL DATA:  Right upper extremity weakness and numbness beginning this morning. Mild occipital headache. EXAM: MRI HEAD WITHOUT CONTRAST MRI CERVICAL SPINE WITHOUT CONTRAST TECHNIQUE: Multiplanar, multiecho pulse sequences of the brain and surrounding structures, and cervical spine, to include the craniocervical junction and cervicothoracic junction, were obtained without intravenous contrast. COMPARISON:  Head  CT 01/27/2018 FINDINGS: MRI HEAD FINDINGS Brain: There are 2 subcentimeter foci of acute cortical infarction in the left perisylvian region. Scattered chronic microhemorrhages are noted in both cerebral and cerebellar hemispheres with sparing of  the deep gray nuclei. The ventricles and sulci are normal. Patchy bilateral cerebral white matter T2 hyperintensities are nonspecific but compatible with mild-to-moderate chronic small vessel ischemic disease. Vascular: Major intracranial vascular flow voids are preserved. Skull and upper cervical spine: Unremarkable bone marrow signal. Sinuses/Orbits: Paranasal sinuses and mastoid air cells are clear. Unremarkable orbits. Other: None. MRI CERVICAL SPINE FINDINGS Patient claustrophobia and prominent respiratory motion result in severe motion artifact on multiple sequences. Axial sequences are largely nondiagnostic. Alignment: No significant listhesis. Vertebrae: No fracture or suspicious osseous lesion. Cord: Extremely limited assessment due to motion artifact through the mid and lower cervical cord. Grossly normal signal in the upper cervical and upper thoracic cord. Posterior Fossa, vertebral arteries, paraspinal tissues: Grossly unremarkable. Disc levels: Limited assessment of degenerative changes due to severe motion on axial sequences. There is focally advanced disc degeneration at C5-6 with moderate disc space narrowing and disc bulging possibly resulting in mild spinal stenosis without evidence of cord compression. There is also likely mild disc bulging at C4-5 and C6-7 without gross spinal stenosis. IMPRESSION: 1. Small acute left perisylvian infarcts. 2. Mild-to-moderate chronic small vessel ischemic disease. 3. Severely motion degraded cervical spine MRI. Limited assessment of disc degeneration as above. Electronically Signed   By: Logan Bores M.D.   On: 01/27/2018 17:46   Scheduled Meds: . aspirin EC  325 mg Oral Daily  . atorvastatin  80 mg Oral q1800  . enoxaparin (LOVENOX) injection  40 mg Subcutaneous Daily   Continuous Infusions:   LOS: 0 days   Kerney Elbe, DO [facility] PAGER is on AMION  If 7PM-7AM, please contact night-coverage [email or web address] Password  [email or web address] 01/28/2018, 7:51 AM

## 2018-01-28 NOTE — Evaluation (Signed)
Occupational Therapy Evaluation Patient Details Name: Debra Fry MRN: [id number] DOB: [birth year] Today's Date: 01/28/2018    History of Present Illness  78 y/o female admitted secondary to R UE weakness. MRI revealed a L perisylvian infarct. PMH including but not limited to HLD and CAD.   Clinical Impression   PT admitted with see above. Pt currently with functional limitiations due to the deficits listed below (see OT problem list). Pt currently supervision with poor recall of dogs names. Pt unable to recall information the entire session. Pt with mild balance deficits noted but able to maintain balance with all challenges. Pt self reports spouse will give support upon d/c with no family currently present.  Pt will benefit from skilled OT to increase their independence and safety with adls and balance to allow discharge home.     Follow Up Recommendations  No OT follow up    Equipment Recommendations  None recommended by OT    Recommendations for Other Services       Precautions / Restrictions Precautions Precautions: None Restrictions Weight Bearing Restrictions: No      Mobility Bed Mobility Overal bed mobility: Modified Independent Bed Mobility: Supine to Sit     Supine to sit: Supervision     General bed mobility comments: for safety  Transfers Overall transfer level: Needs assistance Equipment used: None Transfers: Sit to/from Stand Sit to Stand: Supervision         General transfer comment: for safety    Balance Overall balance assessment: Needs assistance Sitting-balance support: Feet supported Sitting balance-Leahy Scale: Good     Standing balance support: No upper extremity supported Standing balance-Leahy Scale: Good               High level balance activites: Backward walking;Turns;Head turns;Sudden stops High Level Balance Comments: pt with decr speed and incr time to locate room but able to locate Standardized Balance  Assessment Standardized Balance Assessment : Dynamic Gait Index   Dynamic Gait Index Level Surface: Normal Change in Gait Speed: Normal Gait with Horizontal Head Turns: Mild Impairment Gait with Vertical Head Turns: Mild Impairment Gait and Pivot Turn: Mild Impairment Step Over Obstacle: Normal Step Around Obstacles: Normal Steps: Mild Impairment Total Score: 20     ADL either performed or assessed with clinical judgement   ADL Overall ADL's : Needs assistance/impaired Eating/Feeding: Set up   Grooming: Set up   Upper Body Bathing: Set up   Lower Body Bathing: Set up   Upper Body Dressing : Set up   Lower Body Dressing: Set up   Toilet Transfer: Supervision/safety           Functional mobility during ADLs: Supervision/safety General ADL Comments: pt reports spouse will be homd with her at all times     Vision Baseline Vision/History: No visual deficits       Perception     Praxis      Pertinent Vitals/Pain Pain Assessment: No/denies pain     Hand Dominance Right   Extremity/Trunk Assessment Upper Extremity Assessment Upper Extremity Assessment: Generalized weakness   Lower Extremity Assessment Lower Extremity Assessment: Defer to PT evaluation   Cervical / Trunk Assessment Cervical / Trunk Assessment: Normal   Communication Communication Communication: No difficulties   Cognition Arousal/Alertness: Awake/alert Behavior During Therapy: WFL for tasks assessed/performed Overall Cognitive Status: Impaired/Different from baseline Area of Impairment: Problem solving  Problem Solving: Slow processing General Comments: poor recall of dog names but able to recall children and grandchildren with incr time.    General Comments       Exercises     Shoulder Instructions      Home Living Family/patient expects to be discharged to:: Private residence Living Arrangements: Spouse/significant other Available  Help at Discharge: Family;Available 24 hours/day Type of Home: House Home Access: Stairs to enter [employer] of Steps: 2   Home Layout: One level     Bathroom Shower/Tub: Teacher, early years/pre: Standard     Home Equipment: None   Additional Comments: 2 dogs inside ( Max and female) reports "i call them DOggies"      Prior Functioning/Environment Level of Independence: Independent                 OT Problem List: Decreased strength;Decreased activity tolerance;Impaired balance (sitting and/or standing);Decreased safety awareness;Decreased knowledge of use of DME or AE;Decreased knowledge of precautions      OT Treatment/Interventions: Self-care/ADL training;Therapeutic exercise;Neuromuscular education;Energy conservation;DME and/or AE instruction;Therapeutic activities;Cognitive remediation/compensation;Visual/perceptual remediation/compensation;Patient/family education;Balance training    OT Goals(Current goals can be found in the care plan section) Acute Rehab OT Goals Patient Stated Goal: to go home today OT Goal Formulation: Patient unable to participate in goal setting Time For Goal Achievement: 02/11/18 Potential to Achieve Goals: Good  OT Frequency: Min 2X/week   Barriers to D/C:            Co-evaluation              AM-PAC OT "6 Clicks" Daily Activity     Outcome Measure Help from another person eating meals?: A Little Help from another person taking care of personal grooming?: A Little Help from another person toileting, which includes using toliet, bedpan, or urinal?: A Little Help from another person bathing (including washing, rinsing, drying)?: A Little Help from another person to put on and taking off regular upper body clothing?: A Little Help from another person to put on and taking off regular lower body clothing?: A Little 6 Click Score: 18   End of Session Nurse Communication: Mobility  status;Precautions  Activity Tolerance: Patient tolerated treatment well Patient left: in bed;with call bell/phone within reach;with bed alarm set(reports I just cant wake up today)  OT Visit Diagnosis: Unsteadiness on feet (R26.81);Muscle weakness (generalized) (M62.81)                Time: 8416-6063 OT Time Calculation (min): 10 min Charges:  OT General Charges $OT Visit: 1 Visit OT Evaluation $OT Eval Moderate Complexity: 1 Mod   Jeri Modena, OTR/L  Acute Rehabilitation Services Pager: [phone] Office: [phone] .   Jeri Modena 01/28/2018, 1:51 PM

## 2018-01-28 NOTE — Progress Notes (Signed)
VASCULAR LAB PRELIMINARY  PRELIMINARY  PRELIMINARY  PRELIMINARY  Carotid duplex completed.    Preliminary report:  1-39% right ICA stenosis, highest end of scale.  60-79% left ICA stenosis, highest end of scale (349/94).  Bilateral vertebral artery flow is antegrade.  Debra Fry, RVT 01/28/2018, 4:54 PM

## 2018-01-28 NOTE — Progress Notes (Signed)
Patient awake, states that her R hand is "tingling". That it had completely stopped and when she woke up "it is really tingling." She also complained of occipital headache, (scale 10/10) was given tylenol. Information given to oncoming RN for reassessment and continue monitoring. Call bell within reach. Safety maintained.

## 2018-01-28 NOTE — Evaluation (Signed)
Physical Therapy Evaluation Patient Details Name: Debra Fry MRN: [id number] DOB: [birth year] Today's Date: 01/28/2018   History of Present Illness  Pt is a 78 y/o female admitted secondary to R UE weakness. MRI revealed a L perisylvian infarct. PMH including but not limited to HLD and CAD.    Clinical Impression  Pt presented supine in bed with HOB elevated, awake and willing to participate in therapy session. Prior to admission, pt reported that she was independent with all functional mobility and ADLs. Pt lives with her husband in a single level home with two steps to enter. She is currently at min guard to supervision level for all functional mobility with mild instability during higher level balance tasks. Pt scored a 20/24 on the DGI, indicating that she is not at an increased risk for falls. PT will continue to follow acutely to progress mobility as tolerated.     Follow Up Recommendations No PT follow up    Equipment Recommendations  None recommended by PT    Recommendations for Other Services       Precautions / Restrictions Precautions Precautions: None Restrictions Weight Bearing Restrictions: No      Mobility  Bed Mobility Overal bed mobility: Needs Assistance Bed Mobility: Supine to Sit     Supine to sit: Supervision     General bed mobility comments: for safety  Transfers Overall transfer level: Needs assistance Equipment used: None Transfers: Sit to/from Stand Sit to Stand: Min guard         General transfer comment: for safety  Ambulation/Gait Ambulation/Gait assistance: Min guard Gait Distance (Feet): 200 Feet Assistive device: None Gait Pattern/deviations: Step-through pattern;Decreased stride length Gait velocity: decreased   General Gait Details: pt steady with ambulation over level, even surfaces without challenges to balance; mild instability with higher level balance tasks  Stairs Stairs: Yes Stairs assistance: Min guard Stair  Management: Two rails;Step to pattern;Forwards Number of Stairs: 2 General stair comments: min guard for safety  Wheelchair Mobility    Modified Rankin (Stroke Patients Only) Modified Rankin (Stroke Patients Only) Pre-Morbid Rankin Score: No symptoms Modified Rankin: No significant disability     Balance Overall balance assessment: Needs assistance Sitting-balance support: Feet supported Sitting balance-Leahy Scale: Good     Standing balance support: No upper extremity supported Standing balance-Leahy Scale: Good                   Standardized Balance Assessment Standardized Balance Assessment : Dynamic Gait Index   Dynamic Gait Index Level Surface: Normal Change in Gait Speed: Normal Gait with Horizontal Head Turns: Mild Impairment Gait with Vertical Head Turns: Mild Impairment Gait and Pivot Turn: Mild Impairment Step Over Obstacle: Normal Step Around Obstacles: Normal Steps: Mild Impairment Total Score: 20       Pertinent Vitals/Pain Pain Assessment: No/denies pain    Home Living Family/patient expects to be discharged to:: Private residence Living Arrangements: Spouse/significant other Available Help at Discharge: Family;Available 24 hours/day Type of Home: House Home Access: Stairs to enter   [employer] of Steps: 2 Home Layout: One level Home Equipment: None      Prior Function Level of Independence: Independent               Hand Dominance        Extremity/Trunk Assessment   Upper Extremity Assessment Upper Extremity Assessment: Defer to OT evaluation    Lower Extremity Assessment Lower Extremity Assessment: Overall WFL for tasks assessed  Communication   Communication: No difficulties  Cognition Arousal/Alertness: Awake/alert Behavior During Therapy: WFL for tasks assessed/performed Overall Cognitive Status: Impaired/Different from baseline Area of Impairment: Problem solving                              Problem Solving: Slow processing;Difficulty sequencing;Requires verbal cues        General Comments      Exercises     Assessment/Plan    PT Assessment Patient needs continued PT services  PT Problem List Decreased balance;Decreased mobility;Decreased coordination       PT Treatment Interventions DME instruction;Gait training;Functional mobility training;Stair training;Therapeutic exercise;Therapeutic activities;Balance training;Neuromuscular re-education;Patient/family education    PT Goals (Current goals can be found in the Care Plan section)  Acute Rehab PT Goals Patient Stated Goal: return home PT Goal Formulation: With patient Time For Goal Achievement: 02/11/18 Potential to Achieve Goals: Good    Frequency Min 3X/week   Barriers to discharge        Co-evaluation               AM-PAC PT "6 Clicks" Mobility  Outcome Measure Help needed turning from your back to your side while in a flat bed without using bedrails?: None Help needed moving from lying on your back to sitting on the side of a flat bed without using bedrails?: None Help needed moving to and from a bed to a chair (including a wheelchair)?: None Help needed standing up from a chair using your arms (e.g., wheelchair or bedside chair)?: None Help needed to walk in hospital room?: None Help needed climbing 3-5 steps with a railing? : None 6 Click Score: 24    End of Session Equipment Utilized During Treatment: Gait belt Activity Tolerance: Patient tolerated treatment well Patient left: in chair;with call bell/phone within reach;with family/visitor present Nurse Communication: Mobility status PT Visit Diagnosis: Other symptoms and signs involving the nervous system (R29.898)    Time: 8299-3716 PT Time Calculation (min) (ACUTE ONLY): 18 min   Charges:   PT Evaluation $PT Eval Moderate Complexity: 1 Mod          Sherie Don, PT, DPT  Acute Rehabilitation Services Pager  [phone] Office [place] 01/28/2018, 11:28 AM

## 2018-01-29 DIAGNOSIS — I6522 Occlusion and stenosis of left carotid artery: Secondary | ICD-10-CM

## 2018-01-29 LAB — COMPREHENSIVE METABOLIC PANEL
ALT: 9 U/L (ref 0–44)
AST: 15 U/L (ref 15–41)
Albumin: 1.8 g/dL — ABNORMAL LOW (ref 3.5–5.0)
Alkaline Phosphatase: 74 U/L (ref 38–126)
Anion gap: 7 (ref 5–15)
BILIRUBIN TOTAL: 0.1 mg/dL — AB (ref 0.3–1.2)
BUN: 6 mg/dL — ABNORMAL LOW (ref 8–23)
CO2: 26 mmol/L (ref 22–32)
Calcium: 8.3 mg/dL — ABNORMAL LOW (ref 8.9–10.3)
Chloride: 107 mmol/L (ref 98–111)
Creatinine, Ser: 0.48 mg/dL (ref 0.44–1.00)
GFR calc Af Amer: >60 mL/min (ref >60)
GFR calc non Af Amer: >60 mL/min (ref >60)
Glucose, Bld: 170 mg/dL — ABNORMAL HIGH (ref 70–99)
Potassium: 2.8 mmol/L — ABNORMAL LOW (ref 3.5–5.1)
Sodium: 140 mmol/L (ref 135–145)
TOTAL PROTEIN: 7.2 g/dL (ref 6.5–8.1)

## 2018-01-29 LAB — CBC WITH DIFFERENTIAL/PLATELET
Abs Immature Granulocytes: 0.01 10*3/uL (ref 0.00–0.07)
Basophils Absolute: 0 10*3/uL (ref 0.0–0.1)
Basophils Relative: 1 %
Eosinophils Absolute: 0.1 10*3/uL (ref 0.0–0.5)
Eosinophils Relative: 2 %
HCT: 39.3 % (ref 36.0–46.0)
Hemoglobin: 13 g/dL (ref 12.0–15.0)
Immature Granulocytes: 0 %
Lymphocytes Relative: 37 %
Lymphs Abs: 1.5 10*3/uL (ref 0.7–4.0)
MCH: 29.4 pg (ref 26.0–34.0)
MCHC: 33.1 g/dL (ref 30.0–36.0)
MCV: 88.9 fL (ref 80.0–100.0)
Monocytes Absolute: 0.4 10*3/uL (ref 0.1–1.0)
Monocytes Relative: 11 %
Neutro Abs: 2 10*3/uL (ref 1.7–7.7)
Neutrophils Relative %: 49 %
PLATELETS: 211 10*3/uL (ref 150–400)
RBC: 4.42 MIL/uL (ref 3.87–5.11)
RDW: 12.8 % (ref 11.5–15.5)
WBC: 4 10*3/uL (ref 4.0–10.5)
nRBC: 0 % (ref 0.0–0.2)

## 2018-01-29 LAB — BASIC METABOLIC PANEL
Anion gap: 6 (ref 5–15)
BUN: 18 mg/dL (ref 8–23)
CO2: 23 mmol/L (ref 22–32)
Calcium: 9 mg/dL (ref 8.9–10.3)
Chloride: 107 mmol/L (ref 98–111)
Creatinine, Ser: 0.61 mg/dL (ref 0.44–1.00)
GFR calc Af Amer: >60 mL/min (ref >60)
Glucose, Bld: 97 mg/dL (ref 70–99)
POTASSIUM: 4.8 mmol/L (ref 3.5–5.1)
Sodium: 136 mmol/L (ref 135–145)

## 2018-01-29 LAB — MAGNESIUM
Magnesium: 1.5 mg/dL — ABNORMAL LOW (ref 1.7–2.4)
Magnesium: 2.2 mg/dL (ref 1.7–2.4)

## 2018-01-29 LAB — PHOSPHORUS: Phosphorus: 3.6 mg/dL (ref 2.5–4.6)

## 2018-01-29 MED ORDER — MAGNESIUM SULFATE 2 GM/50ML IV SOLN
2.0000 g | Freq: Once | INTRAVENOUS | Status: AC
  Administered 2018-01-29: 2 g via INTRAVENOUS
  Filled 2018-01-29: qty 50

## 2018-01-29 MED ORDER — CLOPIDOGREL BISULFATE 75 MG PO TABS: 75.0000 mg | ORAL_TABLET | Freq: Every day | ORAL | 0 refills | Status: DC

## 2018-01-29 MED ORDER — ATORVASTATIN CALCIUM 80 MG PO TABS: 80.0000 mg | ORAL_TABLET | Freq: Every day | ORAL | 0 refills | Status: DC

## 2018-01-29 MED ORDER — POTASSIUM CHLORIDE 10 MEQ/100ML IV SOLN
10.0000 meq | INTRAVENOUS | Status: AC
  Administered 2018-01-29 (×4): 10 meq via INTRAVENOUS
  Filled 2018-01-29 (×8): qty 100

## 2018-01-29 MED ORDER — ASPIRIN 81 MG PO CHEW: 81.0000 mg | CHEWABLE_TABLET | Freq: Every day | ORAL | 0 refills | Status: DC

## 2018-01-29 MED ORDER — POTASSIUM CHLORIDE CRYS ER 20 MEQ PO TBCR
40.0000 meq | EXTENDED_RELEASE_TABLET | Freq: Two times a day (BID) | ORAL | Status: DC
  Administered 2018-01-29: 40 meq via ORAL
  Filled 2018-01-29: qty 2

## 2018-01-29 NOTE — Consult Note (Signed)
Referring Physician: Dr Alfredia Ferguson  Patient name: Debra Fry MRN: [id number] DOB: 12-25-1940 Sex: female  REASON FOR CONSULT: symptomatic left internal carotid stenosis with stroke  HPI: Debra Fry is a 78 y.o. female, who experienced right arm weakness on Jan 3 and presented to ER with MRI + for sylvan fissure stroke.  She now has completely normal function in that arm.  No prior stroke.  Symptoms lasted about 48 hrs. She was not on aspirin or statin prior to this event.  She has a history of CABG 2001. She has been followed by Neurology in the past for trigeminal neuralgia right side. for about  Other medical problems include hyperlipidemia.  She has chronic numbness and tingling in both hands especially at night.  She is currently on Lipitor, Plavix and aspirin.  Past Medical History:  Diagnosis Date  . Coronary artery disease   . Hyperlipidemia    Past Surgical History:  Procedure Laterality Date  . CORONARY ARTERY BYPASS GRAFT      Family History  Family history unknown: Yes    SOCIAL HISTORY: Social History   Socioeconomic History  . Marital status: Married    Spouse name: Not on file  . Number of children: Not on file  . Years of education: Not on file  . Highest education level: Not on file  Occupational History  . Not on file  Social Needs  . Financial resource strain: Not on file  . Food insecurity:    Worry: Not on file    Inability: Not on file  . Transportation needs:    Medical: Not on file    Non-medical: Not on file  Tobacco Use  . Smoking status: Never Smoker  . Smokeless tobacco: Never Used  Substance and Sexual Activity  . Alcohol use: No  . Drug use: No  . Sexual activity: Not on file  Lifestyle  . Physical activity:    Days per week: Not on file    Minutes per session: Not on file  . Stress: Not on file  Relationships  . Social connections:    Talks on phone: Not on file    Gets together: Not on file    Attends religious service:  Not on file    Active member of club or organization: Not on file    Attends meetings of clubs or organizations: Not on file    Relationship status: Not on file  . Intimate partner violence:    Fear of current or ex partner: Not on file    Emotionally abused: Not on file    Physically abused: Not on file    Forced sexual activity: Not on file  Other Topics Concern  . Not on file  Social History Narrative  . Not on file    No Known Allergies  Current Facility-Administered Medications  Medication Dose Route Frequency Provider Last Rate Last Dose  . acetaminophen (TYLENOL) tablet 650 mg  650 mg Oral Q4H PRN Shela Leff, MD   650 mg at 01/28/18 1818   Or  . acetaminophen (TYLENOL) solution 650 mg  650 mg Per Tube Q4H PRN Shela Leff, MD       Or  . acetaminophen (TYLENOL) suppository 650 mg  650 mg Rectal Q4H PRN Shela Leff, MD      . aspirin chewable tablet 81 mg  81 mg Oral Daily Raiford Noble [place], DO   81 mg at 01/29/18 2876  . atorvastatin (LIPITOR) tablet 80 mg  80 mg Oral q1800 Shela Leff, MD   80 mg at 01/28/18 1710  . clopidogrel (PLAVIX) tablet 75 mg  75 mg Oral Daily Raiford Noble [place], DO   75 mg at 01/29/18 0998  . enoxaparin (LOVENOX) injection 40 mg  40 mg Subcutaneous Daily Shela Leff, MD   40 mg at 01/29/18 3382  . gabapentin (NEURONTIN) capsule 300 mg  300 mg Oral TID PRN Shela Leff, MD      . potassium chloride 10 mEq in 100 mL IVPB  10 mEq Intravenous Q1 Hr x 4 SheikhGeorgina Quint [place], DO 100 mL/hr at 01/29/18 1133 10 mEq at 01/29/18 1133  . potassium chloride SA (K-DUR,KLOR-CON) CR tablet 40 mEq  40 mEq Oral BID Raiford Noble [place], DO   40 mEq at 01/29/18 5053  . senna-docusate (Senokot-S) tablet 1 tablet  1 tablet Oral QHS PRN Shela Leff, MD        ROS:   General:  No weight loss, Fever, chills  HEENT: No recent headaches, no nasal bleeding, no visual changes, no sore throat  Neurologic: No dizziness,  blackouts, seizures. +recent symptoms of stroke or mini- stroke. No recent episodes of slurred speech, or temporary blindness.  Cardiac: No recent episodes of chest pain/pressure, no shortness of breath at rest.  No shortness of breath with exertion.  Denies history of atrial fibrillation or irregular heartbeat  Vascular: No history of rest pain in feet.  No history of claudication.  No history of non-healing ulcer, No history of DVT   Pulmonary: No home oxygen, no productive cough, no hemoptysis,  No asthma or wheezing  Musculoskeletal:  [ ]  Arthritis, [ ]  Low back pain,  [ ]  Joint pain  Hematologic:No history of hypercoagulable state.  No history of easy bleeding.  No history of anemia  Gastrointestinal: No hematochezia or melena,  No gastroesophageal reflux, no trouble swallowing  Urinary: [ ]  chronic Kidney disease, [ ]  on HD - [ ]  MWF or [ ]  TTHS, [ ]  Burning with urination, [ ]  Frequent urination, [ ]  Difficulty urinating;   Skin: No rashes  Psychological: No history of anxiety,  No history of depression   Physical Examination  Vitals:   01/28/18 2346 01/29/18 0406 01/29/18 0735 01/29/18 1106  BP: (!) 143/46 (!) 149/53 (!) 135/51 (!) 143/65  Pulse: 65 (!) 59 66 72  Resp:   16 16  Temp: 98.5 F (36.9 C) 98.3 F (36.8 C) 97.6 F (36.4 C) 97.9 F (36.6 C)  TempSrc: Oral Oral Oral Oral  SpO2: 98% 95% 96% 98%  Weight:      Height:        Body mass index is 24.31 kg/m.  General:  Alert and oriented, no acute distress HEENT: Normal Neck: No JVD Pulmonary: Clear to auscultation bilaterally Cardiac: Regular Rate and Rhythm  Abdomen: Soft, non-tender, non-distended, no mass Skin: No rash Extremity Pulses:  2+ radial, brachial, femoral, absent dorsalis pedis, posterior tibial pulses bilaterally Musculoskeletal: No deformity or edema  Neurologic: Upper and lower extremity motor 5/5 and symmetric, no facial assymetry  DATA:  CTA 70% complex left ICA stenosis, 40% left  SCA stenosis, right vert stenosis  MRI sylvan fissure CVA no blood  CBC    Component Value Date/Time   WBC 4.0 01/29/2018 0642   RBC 4.42 01/29/2018 0642   HGB 13.0 01/29/2018 0642   HCT 39.3 01/29/2018 0642   PLT 211 01/29/2018 0642   MCV 88.9 01/29/2018 0642   MCH 29.4 01/29/2018  0642   MCHC 33.1 01/29/2018 0642   RDW 12.8 01/29/2018 0642   LYMPHSABS 1.5 01/29/2018 0642   MONOABS 0.4 01/29/2018 0642   EOSABS 0.1 01/29/2018 0642   BASOSABS 0.0 01/29/2018 0642    BMET    Component Value Date/Time   NA 140 01/29/2018 0512   K 2.8 (L) 01/29/2018 0512   CL 107 01/29/2018 0512   CO2 26 01/29/2018 0512   GLUCOSE 170 (H) 01/29/2018 0512   BUN 6 (L) 01/29/2018 0512   CREATININE 0.48 01/29/2018 0512   CALCIUM 8.3 (L) 01/29/2018 0512   GFRNONAA >60 01/29/2018 0512   GFRAA >60 01/29/2018 0512    Carotid duplex 60-80% left ICA antegrade verts no sig left ICA stenosis  ASSESSMENT:  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  Continue plavix asa statin  Aline should be put on right and BP on right as more accurate given left SCA stenosis   Ruta Hinds, MD [facility] Office: [phone] Pager: [phone]

## 2018-01-29 NOTE — Progress Notes (Signed)
Pt and family given discharge teaching and follow up instructions. Verbalized understanding and able to teach back. No new questions or concerns. IV and telemetry removed. Pt and family verbalized intent to get set up with PCP.  Family to transport home, discharged from unit by staff in wheelchair.

## 2018-01-29 NOTE — H&P (View-Only) (Signed)
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 female, who experienced right arm weakness on Jan 3 and presented to ER with MRI + for sylvan fissure stroke.  She now has completely normal function in that arm.  No prior stroke.  Symptoms lasted about 48 hrs. She was not on aspirin or statin prior to this event.  She has a history of CABG 2001. She has been followed by Neurology in the past for trigeminal neuralgia right side. for about  Other medical problems include hyperlipidemia.  She has chronic numbness and tingling in both hands especially at night.  She is currently on Lipitor, Plavix and aspirin.  Past Medical History:  Diagnosis Date  . Coronary artery disease   . Hyperlipidemia    Past Surgical History:  Procedure Laterality Date  . CORONARY ARTERY BYPASS GRAFT      Family History  Family history unknown: Yes    SOCIAL HISTORY: Social History   Socioeconomic History  . Marital status: Married    Spouse name: Not on file  . Number of children: Not on file  . Years of education: Not on file  . Highest education level: Not on file  Occupational History  . Not on file  Social Needs  . Financial resource strain: Not on file  . Food insecurity:    Worry: Not on file    Inability: Not on file  . Transportation needs:    Medical: Not on file    Non-medical: Not on file  Tobacco Use  . Smoking status: Never Smoker  . Smokeless tobacco: Never Used  Substance and Sexual Activity  . Alcohol use: No  . Drug use: No  . Sexual activity: Not on file  Lifestyle  . Physical activity:    Days per week: Not on file    Minutes per session: Not on file  . Stress: Not on file  Relationships  . Social connections:    Talks on phone: Not on file    Gets together: Not on file    Attends religious service:  Not on file    Active member of club or organization: Not on file    Attends meetings of clubs or organizations: Not on file    Relationship status: Not on file  . Intimate partner violence:    Fear of current or ex partner: Not on file    Emotionally abused: Not on file    Physically abused: Not on file    Forced sexual activity: Not on file  Other Topics Concern  . Not on file  Social History Narrative  . Not on file    No Known Allergies  Current Facility-Administered Medications  Medication Dose Route Frequency Provider Last Rate Last Dose  . acetaminophen (TYLENOL) tablet 650 mg  650 mg Oral Q4H PRN Shela Leff, MD   650 mg at 01/28/18 1818   Or  . acetaminophen (TYLENOL) solution 650 mg  650 mg Per Tube Q4H PRN Shela Leff, MD       Or  . acetaminophen (TYLENOL) suppository 650 mg  650 mg Rectal Q4H PRN Shela Leff, MD      . aspirin chewable tablet 81 mg  81 mg Oral Daily Raiford Noble [place], DO   81 mg at 01/29/18 2229  . atorvastatin (LIPITOR) tablet 80 mg  80 mg Oral q1800 Shela Leff, MD   80 mg at 01/28/18 1710  . clopidogrel (PLAVIX) tablet 75 mg  75 mg Oral Daily Raiford Noble [place], DO   75 mg at 01/29/18 1610  . enoxaparin (LOVENOX) injection 40 mg  40 mg Subcutaneous Daily Shela Leff, MD   40 mg at 01/29/18 9604  . gabapentin (NEURONTIN) capsule 300 mg  300 mg Oral TID PRN Shela Leff, MD      . potassium chloride 10 mEq in 100 mL IVPB  10 mEq Intravenous Q1 Hr x 4 SheikhGeorgina Quint [place], DO 100 mL/hr at 01/29/18 1133 10 mEq at 01/29/18 1133  . potassium chloride SA (K-DUR,KLOR-CON) CR tablet 40 mEq  40 mEq Oral BID Raiford Noble [place], DO   40 mEq at 01/29/18 5409  . senna-docusate (Senokot-S) tablet 1 tablet  1 tablet Oral QHS PRN Shela Leff, MD        ROS:   General:  No weight loss, Fever, chills  HEENT: No recent headaches, no nasal bleeding, no visual changes, no sore throat  Neurologic: No dizziness,  blackouts, seizures. +recent symptoms of stroke or mini- stroke. No recent episodes of slurred speech, or temporary blindness.  Cardiac: No recent episodes of chest pain/pressure, no shortness of breath at rest.  No shortness of breath with exertion.  Denies history of atrial fibrillation or irregular heartbeat  Vascular: No history of rest pain in feet.  No history of claudication.  No history of non-healing ulcer, No history of DVT   Pulmonary: No home oxygen, no productive cough, no hemoptysis,  No asthma or wheezing  Musculoskeletal:  [ ]  Arthritis, [ ]  Low back pain,  [ ]  Joint pain  Hematologic:No history of hypercoagulable state.  No history of easy bleeding.  No history of anemia  Gastrointestinal: No hematochezia or melena,  No gastroesophageal reflux, no trouble swallowing  Urinary: [ ]  chronic Kidney disease, [ ]  on HD - [ ]  MWF or [ ]  TTHS, [ ]  Burning with urination, [ ]  Frequent urination, [ ]  Difficulty urinating;   Skin: No rashes  Psychological: No history of anxiety,  No history of depression   Physical Examination  Vitals:   01/28/18 2346 01/29/18 0406 01/29/18 0735 01/29/18 1106  BP: (!) 143/46 (!) 149/53 (!) 135/51 (!) 143/65  Pulse: 65 (!) 59 66 72  Resp:   16 16  Temp: 98.5 F (36.9 C) 98.3 F (36.8 C) 97.6 F (36.4 C) 97.9 F (36.6 C)  TempSrc: Oral Oral Oral Oral  SpO2: 98% 95% 96% 98%  Weight:      Height:        Body mass index is 24.31 kg/m.  General:  Alert and oriented, no acute distress HEENT: Normal Neck: No JVD Pulmonary: Clear to auscultation bilaterally Cardiac: Regular Rate and Rhythm  Abdomen: Soft, non-tender, non-distended, no mass Skin: No rash Extremity Pulses:  2+ radial, brachial, femoral, absent dorsalis pedis, posterior tibial pulses bilaterally Musculoskeletal: No deformity or edema  Neurologic: Upper and lower extremity motor 5/5 and symmetric, no facial assymetry  DATA:  CTA 70% complex left ICA stenosis, 40% left  SCA stenosis, right vert stenosis  MRI sylvan fissure CVA no blood  CBC    Component Value Date/Time   WBC 4.0 01/29/2018 0642   RBC 4.42 01/29/2018 0642   HGB 13.0 01/29/2018 0642   HCT 39.3 01/29/2018 0642   PLT 211 01/29/2018 0642   MCV 88.9 01/29/2018 0642   MCH 29.4 01/29/2018  0642   MCHC 33.1 01/29/2018 0642   RDW 12.8 01/29/2018 0642   LYMPHSABS 1.5 01/29/2018 0642   MONOABS 0.4 01/29/2018 0642   EOSABS 0.1 01/29/2018 0642   BASOSABS 0.0 01/29/2018 0642    BMET    Component Value Date/Time   NA 140 01/29/2018 0512   K 2.8 (L) 01/29/2018 0512   CL 107 01/29/2018 0512   CO2 26 01/29/2018 0512   GLUCOSE 170 (H) 01/29/2018 0512   BUN 6 (L) 01/29/2018 0512   CREATININE 0.48 01/29/2018 0512   CALCIUM 8.3 (L) 01/29/2018 0512   GFRNONAA >60 01/29/2018 0512   GFRAA >60 01/29/2018 0512    Carotid duplex 60-80% left ICA antegrade verts no sig left ICA stenosis  ASSESSMENT:  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  Continue plavix asa statin  Aline should be put on right and BP on right as more accurate given left SCA stenosis   Ruta Hinds, MD [facility] Office: [phone] Pager: [phone]

## 2018-01-29 NOTE — Progress Notes (Signed)
STROKE TEAM PROGRESS NOTE      SUBJECTIVE (INTERVAL HISTORY) Her RN is at the bedside. Patient was seen by Dr. Oneida Alar from*her surgery recommends elective outpatient left carotid surgery. Carotid ultrasound confirmed 60-80% left ICA stenosis   OBJECTIVE Vitals:   01/28/18 2346 01/29/18 0406 01/29/18 0735 01/29/18 1106  BP: (!) 143/46 (!) 149/53 (!) 135/51 (!) 143/65  Pulse: 65 (!) 59 66 72  Resp:   16 16  Temp: 98.5 F (36.9 C) 98.3 F (36.8 C) 97.6 F (36.4 C) 97.9 F (36.6 C)  TempSrc: Oral Oral Oral Oral  SpO2: 98% 95% 96% 98%  Weight:      Height:        CBC:  Recent Labs  Lab 01/28/18 1013 01/29/18 0642  WBC 4.7 4.0  NEUTROABS 2.9 2.0  HGB 12.9 13.0  HCT 39.2 39.3  MCV 87.5 88.9  PLT 211 824    Basic Metabolic Panel:  Recent Labs  Lab 01/28/18 1013 01/29/18 0512  NA 137 140  K 3.5 2.8*  CL 106 107  CO2 23 26  GLUCOSE 108* 170*  BUN 16 6*  CREATININE 0.73 0.48  CALCIUM 9.1 8.3*  MG 1.8 1.5*  PHOS 3.5 3.6    Lipid Panel:     Component Value Date/Time   CHOL 234 (H) 01/28/2018 0546   TRIG 131 01/28/2018 0546   HDL 39 (L) 01/28/2018 0546   CHOLHDL 6.0 01/28/2018 0546   VLDL 26 01/28/2018 0546   LDLCALC 169 (H) 01/28/2018 0546   HgbA1c:  Lab Results  Component Value Date   HGBA1C 5.8 (H) 01/28/2018   Urine Drug Screen: No results found for: LABOPIA, COCAINSCRNUR, LABBENZ, AMPHETMU, THCU, LABBARB  Alcohol Level No results found for: ETH  IMAGING  Ct Angio Head W Or Wo Contrast Ct Angio Neck W And/or Wo Contrast 01/27/2018 IMPRESSION:  In this patient with acute infarction at the left frontoparietal junction, the dominant finding is that of advanced and irregular atherosclerotic disease at the left carotid bifurcation and ICA bulb. Minimal diameter in the ICA bulb is approximately 1 mm, consistent with a 75% stenosis. The pronounced irregularity could serve as a source of embolic disease. There is no large or medium vessel intracranial  occlusion identifiable at this time. Atherosclerotic disease at the right carotid bifurcation with stenosis of the ICA of 40%. 70% right vertebral artery origin stenosis. That vessel is non dominant.    Ct Head Wo Contrast 01/27/2018 IMPRESSION:  1. No acute intracranial findings.  2. Mild chronic ischemic microvascular white matter disease.  3. Atherosclerosis.    Mr Brain Wo Contrast 01/27/2018 IMPRESSION:  1. Small acute left perisylvian infarcts.  2. Mild-to-moderate chronic small vessel ischemic disease.     Mr Cervical Spine Wo Contrast  01/27/2018 Severely motion degraded cervical spine MRI. Limited assessment of disc degeneration.    Transthoracic Echocardiogram  01/28/2018 Study Conclusions - Left ventricle: The cavity size was normal. Wall thickness was   increased in a pattern of mild LVH. Systolic function was normal.   The estimated ejection fraction was in the range of 55% to 60%.   Wall motion was normal; there were no regional wall motion   abnormalities. - Mitral valve: Calcified annulus. There was mild regurgitation. Impressions: - No cardiac source of emboli was indentified.    Bilateral Carotid Dopplers  00/00/00 Pending    PHYSICAL EXAM Blood pressure (!) 143/65, pulse 72, temperature 97.9 F (36.6 C), temperature source Oral, resp. rate 16, height  5\' 2"  (1.575 m), weight 60.3 kg, SpO2 98 %.  Pleasant elderly Caucasian lady currently not in distress. . Afebrile. Head is nontraumatic. Neck is supple without bruit.    Cardiac exam no murmur or gallop. Lungs are clear to auscultation. Distal pulses are well felt.  Neurological Exam ;  Awake  Alert oriented x 3. Normal speech and language.eye movements full without nystagmus.fundi were not visualized. Vision acuity and fields appear normal. Hearing is normal. Palatal movements are normal. Face symmetric. Tongue midline. Normal strength, tone, reflexes and coordination. Normal sensation. Gait  deferred.   ASSESSMENT/PLAN [name] is a 78 y.o. female with history of CAD and hyperlipidemia who presents with right arm numbness. She did not receive IV t-PA due to late presentation.   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  Resultant  No residual deficits  CT head - no acute findings  MRI head - Small acute left perisylvian infarcts.   MRI C Spine - Severely motion degraded. Limited assessment of disc degeneration.  MRA head - not performed  CTA H&N - acute infarction at the left frontoparietal junction - left carotid stenosis 75%. Non dominant Rt VA - 70%.  Carotid Doppler - pending  2D Echo - EF 55 - 60%. No cardiac source of emboli identified.   LDL - 169  HgbA1c - 5.8  UDS - not ordered  VTE prophylaxis - Lovenox  Diet  - Heart healthy with thin liquids.  No antithrombotic prior to admission, now on aspirin 325 mg daily  Patient counseled to be compliant with her antithrombotic medications  Ongoing aggressive stroke risk factor management  Therapy recommendations: No F/U PT recommended  Disposition:  Pending  Hypertension  Stable . Permissive hypertension (OK if < 220/120) but gradually normalize in 5-7 days . Long-term BP goal normotensive  Hyperlipidemia  Lipid lowering medication PTA:  none  LDL 169, goal < 70  Current lipid lowering medication: Lipitor 80 mg daily  Continue statin at discharge   Other Stroke Risk Factors  Advanced age  Coronary artery disease  Other Active Problems  Left carotid stenosis - consult vascular surgery     Hospital day # 1 She presented with right sided weakness likely from left MCA branch infarct symptomatic from proximal left carotid stenosis. Vascular surgery plan elective left carotid endarterectomy over the next 1-2 weeks. Recommend aspirin and Plavix for 3 weeks followed by aspirin alone. Continue   aggressive risk factor  modification. Follow-up as an outpatient in stroke clinic in 6 weeks. Stroke team will sign off. Kindly call for questions. Discussed with Dr. Alfredia Ferguson.Greater than 50% time during this 25 minute visit was spent on counseling and coordination of care about her stroke and discussion about symptomatically stenosis and treatment options and answering questions. Antony Contras, MD Medical Director [facility] Pager: [phone] 01/29/2018 2:56 PM To contact Stroke Continuity provider, please refer to [email or web address] After hours, contact General Neurology

## 2018-01-29 NOTE — Discharge Instructions (Signed)
Recommendations for Outpatient Follow-up:  1. Follow up with PCP in 1-2 weeks 2. Follow up with Neurology in 4-6 weeks; Per Neurology Continue ASA and Plavix for 3 weeks followed by Aspirin alone  3. Follow up with Vascular Surgery  4. Please obtain CMP/CBC, Mag, Phos in one week 5. Please follow up on the following pending results:  Ischemic Stroke  An ischemic stroke is the sudden death of brain tissue. Blood carries oxygen to all areas of the body. This type of stroke happens when your blood does not flow to your brain like normal. Your brain cannot get the oxygen it needs. This is an emergency. It must be treated right away. Symptoms of a stroke usually happen all of a sudden. You may notice them when you wake up. They can include: Weakness or loss of feeling in your face, arm, or leg. This often happens on one side of the body. Trouble walking. Trouble moving your arms or legs. Loss of balance or coordination. Feeling confused. Trouble talking or understanding what people are saying. Slurred speech. Trouble seeing. Seeing two of one object (double vision). Feeling dizzy. Feeling sick to your stomach (nauseous) and throwing up (vomiting). A very bad headache for no reason. Get help as soon as any of these problems start. This is important. Some treatments work better if they are given right away. These include: Aspirin. Medicines to control blood pressure. A shot (injection) of medicine to break up the blood clot. Treatments given in the blood vessel (artery) to take out the clot or break it up. Other treatments may include: Oxygen. Fluids given through an IV tube. Medicines to thin out your blood. Procedures to help your blood flow better. What increases the risk? Certain things may make you more likely to have a stroke. Some of these are things that you can change, such as: Being very overweight (obesity). Smoking. Taking birth control pills. Not being active. Drinking  too much alcohol. Using drugs. Other risk factors include: High blood pressure. High cholesterol. Diabetes. Heart disease. Being [place] American, Native American, Hispanic, or [place] Native. Being over age 55. Family history of stroke. Having had blood clots, stroke, or warning stroke (transient ischemic attack, TIA) in the past. Sickle cell disease. Being a woman with a history of high blood pressure in pregnancy (preeclampsia). Migraine headache. Sleep apnea. Having an irregular heartbeat (atrial fibrillation). Long-term (chronic) diseases that cause soreness and swelling (inflammation). Disorders that affect how your blood clots. Follow these instructions at home: Medicines Take over-the-counter and prescription medicines only as told by your doctor. If you were told to take aspirin or another medicine to thin your blood, take it exactly as told by your doctor. Taking too much of the medicine can cause bleeding. If you do not take enough, it may not work as well. Know the side effects of your medicines. If you are taking a blood thinner, make sure you: Hold pressure over any cuts for longer than usual. Tell your dentist and other doctors that you take this medicine. Avoid activities that may cause damage or injury to your body. Eating and drinking Follow instructions from your doctor about what you cannot eat or drink. Eat healthy foods. If you have trouble with swallowing, do these things to avoid choking: Take small bites when eating. Eat foods that are soft or pureed. Safety Follow instructions from your health care team about physical activity. Use a walker or cane as told by your doctor. Keep your home safe so  you do not fall. This may include: Having experts look at your home to make sure it is safe. Putting grab bars in the bedroom and bathroom. Using raised toilets. Putting a seat in the shower. General instructions Do not use any tobacco products. Examples of  these are cigarettes, chewing tobacco, and e-cigarettes. If you need help quitting, ask your doctor. Limit how much alcohol you drink. This means no more than 1 drink a day for nonpregnant women and 2 drinks a day for men. One drink equals 12 oz of beer, 5 oz of wine, or 1 oz of hard liquor. If you need help to stop using drugs or alcohol, ask your doctor to refer you to a program or specialist. Stay active. Exercise as told by your doctor. Keep all follow-up visits as told by your doctor. This is important. Get help right away if:  You have any signs of a stroke. "BE FAST" is an easy way to remember the main warning signs: B - Balance. Signs are dizziness, sudden trouble walking, or loss of balance. E - Eyes. Signs are trouble seeing or a change in how you see. F - Face. Signs are sudden weakness or loss of feeling of the face, or the face or eyelid drooping on one side. A - Arms. Signs are weakness or loss of feeling in an arm. This happens suddenly and usually on one side of the body. S - Speech. Signs are sudden trouble speaking, slurred speech, or trouble understanding what people say. T - Time. Time to call emergency services. Write down what time symptoms started. You have other signs of a stroke, such as: A sudden, very bad headache with no known cause. Feeling sick to your stomach (nausea). Throwing up (vomiting). Jerky movements you cannot control (seizure). These symptoms may be an emergency. Do not wait to see if the symptoms will go away. Get medical help right away. Call your local emergency services (911 in the [place] Do not drive yourself to the hospital. Summary An ischemic stroke is the sudden death of brain tissue. Symptoms of a stroke usually happen all of a sudden. You may notice them when you wake up. Get help if you have any warning signs of a stroke. This is important. Some treatments work better if they are given right away. This information is not intended to  replace advice given to you by your health care provider. Make sure you discuss any questions you have with your health care provider. Document Released: 12/31/2010 Document Revised: 06/22/2017 Document Reviewed: 04/09/2015 Elsevier Interactive Patient Education  [employer]. 6.

## 2018-01-30 ENCOUNTER — Telehealth: Payer: Self-pay

## 2018-01-30 ENCOUNTER — Other Ambulatory Visit: Payer: Self-pay

## 2018-01-30 NOTE — Telephone Encounter (Signed)
Spoke to pt. To confirm surgery date/time and pre-op. Instructions. Reminded pt. To remain on statin, ASA, and Plavix. Pt. Verbalized understanding. No further questions at this time.

## 2018-02-07 ENCOUNTER — Encounter (HOSPITAL_COMMUNITY): Payer: Self-pay

## 2018-02-07 NOTE — Pre-Procedure Instructions (Signed)
Debra Fry  02/07/2018      Your procedure is scheduled on February 13, 2018.  Report to [facility] Admitting at 05:30 A.M.  Call this number if you have problems the morning of surgery:  [phone]   Remember:  Do not eat or drink after midnight.    Take these medicines the morning of surgery with A SIP OF WATER : Aspirin Clopidogrel (Plavix) Gabapentin (Neurontin) if needed  7 days prior to surgery STOP taking any Aleve, Naproxen, Ibuprofen, Motrin, Advil, Goody's, BC's, all herbal medications, fish oil, and all vitamins.     Do not wear jewelry, make-up or nail polish.  Do not wear lotions, powders, or perfumes, or deodorant.  Do not shave 48 hours prior to surgery.  Do not bring valuables to the hospital.  [facility] is not responsible for any belongings or valuables.  Contacts, dentures or bridgework may not be worn into surgery.  Leave your suitcase in the car.  After surgery it may be brought to your room.  For patients admitted to the hospital, discharge time will be determined by your treatment team.  Patients discharged the day of surgery will not be allowed to drive home.   Special instructions:   [facility]- Preparing For Surgery  Before surgery, you can play an important role. Because skin is not sterile, your skin needs to be as free of germs as possible. You can reduce the number of germs on your skin by washing with CHG (chlorahexidine gluconate) Soap before surgery.  CHG is an antiseptic cleaner which kills germs and bonds with the skin to continue killing germs even after washing.    Oral Hygiene is also important to reduce your risk of infection.  Remember - BRUSH YOUR TEETH THE MORNING OF SURGERY WITH YOUR REGULAR TOOTHPASTE  Please do not use if you have an allergy to CHG or antibacterial soaps. If your skin becomes reddened/irritated stop using the CHG.  Do not shave (including legs and underarms) for at least 48 hours prior to first  CHG shower. It is OK to shave your face.  Please follow these instructions carefully.   1. Shower the NIGHT BEFORE SURGERY and the MORNING OF SURGERY with CHG.   2. If you chose to wash your hair, wash your hair first as usual with your normal shampoo.  3. After you shampoo, rinse your hair and body thoroughly to remove the shampoo.  4. Use CHG as you would any other liquid soap. You can apply CHG directly to the skin and wash gently with a scrungie or a clean washcloth.   5. Apply the CHG Soap to your body ONLY FROM THE NECK DOWN.  Do not use on open wounds or open sores. Avoid contact with your eyes, ears, mouth and genitals (private parts). Wash Face and genitals (private parts)  with your normal soap.  6. Wash thoroughly, paying special attention to the area where your surgery will be performed.  7. Thoroughly rinse your body with warm water from the neck down.  8. DO NOT shower/wash with your normal soap after using and rinsing off the CHG Soap.  9. Pat yourself dry with a CLEAN TOWEL.  10. Wear CLEAN PAJAMAS to bed the night before surgery, wear comfortable clothes the morning of surgery  11. Place CLEAN SHEETS on your bed the night of your first shower and DO NOT SLEEP WITH PETS.    Day of Surgery:  Do not apply  any deodorants/lotions.  Please wear clean clothes to the hospital/surgery center.   Remember to brush your teeth WITH YOUR REGULAR TOOTHPASTE.    Please read over the following fact sheets that you were given.

## 2018-02-08 ENCOUNTER — Encounter (HOSPITAL_COMMUNITY)
Admission: RE | Admit: 2018-02-08 | Discharge: 2018-02-08 | Disposition: A | Discharge status: Home / Self Care | Payer: Medicare HMO | Source: Ambulatory Visit | Attending: Vascular Surgery | Admitting: Vascular Surgery

## 2018-02-08 ENCOUNTER — Other Ambulatory Visit: Payer: Self-pay

## 2018-02-08 ENCOUNTER — Encounter (HOSPITAL_COMMUNITY): Payer: Self-pay

## 2018-02-08 DIAGNOSIS — Z01812 Encounter for preprocedural laboratory examination: Secondary | ICD-10-CM | POA: Diagnosis not present

## 2018-02-08 HISTORY — DX: Headache, unspecified: R51.9

## 2018-02-08 HISTORY — DX: Personal history of other diseases of the digestive system: Z87.19

## 2018-02-08 HISTORY — DX: Gastro-esophageal reflux disease without esophagitis: K21.9

## 2018-02-08 HISTORY — DX: Headache: R51

## 2018-02-08 HISTORY — DX: Acute myocardial infarction, unspecified: I21.9

## 2018-02-08 HISTORY — DX: Cerebral infarction, unspecified: I63.9

## 2018-02-08 LAB — TYPE AND SCREEN
ABO/RH(D): A POS
Antibody Screen: NEGATIVE

## 2018-02-08 LAB — COMPREHENSIVE METABOLIC PANEL
ALBUMIN: 3.5 g/dL (ref 3.5–5.0)
ALT: 23 U/L (ref 0–44)
AST: 22 U/L (ref 15–41)
Alkaline Phosphatase: 64 U/L (ref 38–126)
Anion gap: 8 (ref 5–15)
BUN: 14 mg/dL (ref 8–23)
CO2: 27 mmol/L (ref 22–32)
Calcium: 9.1 mg/dL (ref 8.9–10.3)
Chloride: 106 mmol/L (ref 98–111)
Creatinine, Ser: 0.71 mg/dL (ref 0.44–1.00)
GFR calc Af Amer: >60 mL/min (ref >60)
GFR calc non Af Amer: >60 mL/min (ref >60)
Glucose, Bld: 94 mg/dL (ref 70–99)
Potassium: 3.5 mmol/L (ref 3.5–5.1)
SODIUM: 141 mmol/L (ref 135–145)
Total Bilirubin: 1.3 mg/dL — ABNORMAL HIGH (ref 0.3–1.2)
Total Protein: 7.1 g/dL (ref 6.5–8.1)

## 2018-02-08 LAB — URINALYSIS, ROUTINE W REFLEX MICROSCOPIC
Bilirubin Urine: NEGATIVE
Glucose, UA: NEGATIVE mg/dL
Hgb urine dipstick: NEGATIVE
Ketones, ur: NEGATIVE mg/dL
Nitrite: POSITIVE — AB
PH: 5 (ref 5.0–8.0)
Protein, ur: NEGATIVE mg/dL
Specific Gravity, Urine: 1.02 (ref 1.005–1.030)
Trans Epithel, UA: 1

## 2018-02-08 LAB — CBC
HCT: 37.2 % (ref 36.0–46.0)
Hemoglobin: 12.3 g/dL (ref 12.0–15.0)
MCH: 29.9 pg (ref 26.0–34.0)
MCHC: 33.1 g/dL (ref 30.0–36.0)
MCV: 90.5 fL (ref 80.0–100.0)
NRBC: 0 % (ref 0.0–0.2)
Platelets: 218 10*3/uL (ref 150–400)
RBC: 4.11 MIL/uL (ref 3.87–5.11)
RDW: 13 % (ref 11.5–15.5)
WBC: 6.4 10*3/uL (ref 4.0–10.5)

## 2018-02-08 LAB — PROTIME-INR
INR: 1.18
Prothrombin Time: 14.9 seconds (ref 11.4–15.2)

## 2018-02-08 LAB — APTT: APTT: 32 s (ref 24–36)

## 2018-02-08 LAB — SURGICAL PCR SCREEN
MRSA, PCR: NEGATIVE
Staphylococcus aureus: NEGATIVE

## 2018-02-08 LAB — ABO/RH: ABO/RH(D): A POS

## 2018-02-08 NOTE — Progress Notes (Signed)
PCP: NONE Cardiologist: Denies--saw cardiologist post CABG in 2001, but reports told that follow-up not needed  EKG: 01/28/2018 CXR: denies ECHO: 01-28-2018 Stress Test: denies Cardiac Cath: 01-14-2000--under Encounters Echart conversion  Instructed to continue ASA and Plavix per surgeon.   Patient denies shortness of breath, fever, cough, and chest pain at PAT appointment.  Patient verbalized understanding of instructions provided today at the PAT appointment.  Patient asked to review instructions at home and day of surgery.

## 2018-02-09 NOTE — Progress Notes (Signed)
Anesthesia Chart Review:  Case:  [id number] Date/Time:  02/13/18 0715   Procedure:  ENDARTERECTOMY CAROTID (Left )   Anesthesia type:  General   Pre-op diagnosis:  LEFT CAROTID STENOSIS   Location:  [facility] ROOM 11 / [facility] OR   Surgeon:  Elam Dutch, MD      DISCUSSION: 78 yo female never smoker. Pertinent hx includes CAD (MI treated with CABG 2001), GERD, Hiatal hernia, CVA, Trigeminal neuralgia.  In 2001 pt was admitted for unstable angina and ruled in for MI. Cardiac cath revealed 99% stenosis in her mid RCA.  She also had a long 80% stenosis at the ostium of her left circumflex which was not amenable to angioplasty and pt ultimately underwent two vessel CABG. It does not appear that pt has had cardiology f/u since 2002.  Pt was admitted 1/3-01/29/18 for Acute CVAin the setting of Symptomatic Carotid Artery Disease. CTAof the Head and Neck was read as "In this patient with acute infarction at the left frontoparietal junction, the dominant finding is that of advanced and irregular atherosclerotic disease at the left carotid bifurcation and ICA bulb. Minimal diameter in the ICA bulb is approximately 1 mm, consistent with a 75% stenosis. The pronounced irregularity could serve as a source of embolic disease. There is no large or medium vessel intracranial occlusion identifiable at this time. Atherosclerotic disease at the right carotid bifurcation withstenosis of the ICA of 40%.70% right vertebral artery origin stenosis. That vessel is non dominant." Carotid Dopplers showed:1-39% right ICA stenosis, highest end of scale. 60-79% left ICA stenosis, highest end of scale (349/94). Echo showed EF of 55-60% with wall motion that was normal and no regional wall motion abnormalities. The mitralshowed a calcified annulus and there is mild mitral regurgitation and overall there is no cardiac source of emboli that was identified.  Placed on ASA and Statin as well as Clopidogrel and Neurology recommended c/w  ASA and Plavix for 3 weeks and then just ASA alone.   Remote CABG with minimal cardiology f/u. Given normal echo 01/28/18 and acute need for CEA due to stroke risk, anticipate she can proceed as planned barring acute status change.  VS: BP (!) 135/51   Pulse 64   Temp 36.6 C   Resp 18   Ht 5\' 2"  (1.575 m)   Wt 61.2 kg   SpO2 98%   BMI 24.69 kg/m   PROVIDERS: Patient, No Pcp Per   LABS: Labs reviewed: Acceptable for surgery. (all labs ordered are listed, but only abnormal results are displayed)  Labs Reviewed  COMPREHENSIVE METABOLIC PANEL - Abnormal; Notable for the following components:      Result Value   Total Bilirubin 1.3 (*)    All other components within normal limits  URINALYSIS, ROUTINE W REFLEX MICROSCOPIC - Abnormal; Notable for the following components:   APPearance HAZY (*)    Nitrite POSITIVE (*)    Leukocytes, UA SMALL (*)    Bacteria, UA MANY (*)    All other components within normal limits  SURGICAL PCR SCREEN  APTT  CBC  PROTIME-INR  TYPE AND SCREEN  ABO/RH     IMAGES: CTA Neck 01/27/2018: IMPRESSION: In this patient with acute infarction at the left frontoparietal junction, the dominant finding is that of advanced and irregular atherosclerotic disease at the left carotid bifurcation and ICA bulb. Minimal diameter in the ICA bulb is approximately 1 mm, consistent with a 75% stenosis. The pronounced irregularity could serve as a source of embolic  disease. There is no large or medium vessel intracranial occlusion identifiable at this time.  Atherosclerotic disease at the right carotid bifurcation with stenosis of the ICA of 40%.  70% right vertebral artery origin stenosis. That vessel is non dominant.  EKG: 01/27/2018: Normal sinus rhythm. Rate 68. Nonspecific T wave abnormality. No significant change since 2012  CV: Carotid US 01/29/2018: Summary: Right Carotid: Velocities in the right ICA are consistent with a 1-39% stenosis,                 highest end of scale.  Left Carotid: Velocities in the left ICA are consistent with a 60-79% stenosis,               highest end of scale.  Vertebrals:  Bilateral vertebral arteries demonstrate antegrade flow. Subclavians: Normal flow hemodynamics were seen in bilateral subclavian              arteries.  TTE 01/28/2018: Study Conclusions  - Left ventricle: The cavity size was normal. Wall thickness was   increased in a pattern of mild LVH. Systolic function was normal.   The estimated ejection fraction was in the range of 55% to 60%.   Wall motion was normal; there were no regional wall motion   abnormalities. - Mitral valve: Calcified annulus. There was mild regurgitation.  Impressions:  - No cardiac source of emboli was indentified.  Past Medical History:  Diagnosis Date  . Coronary artery disease   . GERD (gastroesophageal reflux disease)   . Headache   . History of hiatal hernia   . Hyperlipidemia   . Myocardial infarction ([place]) 12/1999  . Stroke [facility])     Past Surgical History:  Procedure Laterality Date  . CARDIAC CATHETERIZATION  2001   results in [place] conversion  . CORONARY ARTERY BYPASS GRAFT  2001    MEDICATIONS: . aspirin 81 MG chewable tablet  . atorvastatin (LIPITOR) 80 MG tablet  . clopidogrel (PLAVIX) 75 MG tablet  . gabapentin (NEURONTIN) 300 MG capsule  . tetrahydrozoline-zinc (VISINE-AC) 0.05-0.25 % ophthalmic solution   No current facility-administered medications for this encounter.     Wynonia Musty [place] [facility]/Anesthesiology Phone [phone] 02/09/2018 12:46 PM

## 2018-02-09 NOTE — Anesthesia Preprocedure Evaluation (Addendum)
Anesthesia Evaluation  Patient identified by MRN, date of birth, ID band Patient awake    Reviewed: Allergy & Precautions, NPO status , Patient's Chart, lab work & pertinent test results  Airway Mallampati: II  TM Distance: >3 FB Neck ROM: Full    Dental  (+) Teeth Intact, Dental Advidsory Given   Pulmonary neg pulmonary ROS,    breath sounds clear to auscultation       Cardiovascular (-) angina+ CAD, + Past MI and + CABG  (-) DOE  Rhythm:Regular Rate:Normal  Normal EF. Mild MR.   Neuro/Psych  Headaches, CVA, No Residual Symptoms    GI/Hepatic Neg liver ROS, hiatal hernia, GERD  ,  Endo/Other  negative endocrine ROS  Renal/GU negative Renal ROS     Musculoskeletal   Abdominal   Peds  Hematology negative hematology ROS (+)   Anesthesia Other Findings   Reproductive/Obstetrics                           Lab Results  Component Value Date   WBC 6.4 02/08/2018   HGB 12.3 02/08/2018   HCT 37.2 02/08/2018   MCV 90.5 02/08/2018   PLT 218 02/08/2018   Lab Results  Component Value Date   CREATININE 0.71 02/08/2018   BUN 14 02/08/2018   NA 141 02/08/2018   K 3.5 02/08/2018   CL 106 02/08/2018   CO2 27 02/08/2018    Anesthesia Physical Anesthesia Plan  ASA: IV  Anesthesia Plan: General   Post-op Pain Management:    Induction: Intravenous  PONV Risk Score and Plan: 3 and Dexamethasone, Ondansetron and Treatment may vary due to age or medical condition  Airway Management Planned: Oral ETT  Additional Equipment: Arterial line  Intra-op Plan:   Post-operative Plan: Extubation in OR and Possible Post-op intubation/ventilation  Informed Consent: I have reviewed the patients History and Physical, chart, labs and discussed the procedure including the risks, benefits and alternatives for the proposed anesthesia with the patient or authorized representative who has indicated his/her  understanding and acceptance.     Dental advisory given  Plan Discussed with: CRNA  Anesthesia Plan Comments: ( )      Anesthesia Quick Evaluation

## 2018-02-13 ENCOUNTER — Inpatient Hospital Stay (HOSPITAL_COMMUNITY)
Admission: RE | Admit: 2018-02-13 | Discharge: 2018-02-14 | DRG: 038 | Disposition: A | Discharge status: Home / Self Care | Payer: Medicare HMO | Attending: Vascular Surgery | Admitting: Vascular Surgery

## 2018-02-13 ENCOUNTER — Inpatient Hospital Stay (HOSPITAL_COMMUNITY): Payer: Medicare HMO | Admitting: Physician Assistant

## 2018-02-13 ENCOUNTER — Encounter (HOSPITAL_COMMUNITY)
Admission: RE | Disposition: A | Discharge status: Home / Self Care | Payer: Self-pay | Source: Home / Self Care | Attending: Vascular Surgery

## 2018-02-13 ENCOUNTER — Inpatient Hospital Stay (HOSPITAL_COMMUNITY): Payer: Medicare HMO | Admitting: Anesthesiology

## 2018-02-13 DIAGNOSIS — H5702 Anisocoria: Secondary | ICD-10-CM | POA: Diagnosis not present

## 2018-02-13 DIAGNOSIS — Z951 Presence of aortocoronary bypass graft: Secondary | ICD-10-CM | POA: Diagnosis not present

## 2018-02-13 DIAGNOSIS — Z8673 Personal history of transient ischemic attack (TIA), and cerebral infarction without residual deficits: Secondary | ICD-10-CM

## 2018-02-13 DIAGNOSIS — I63412 Cerebral infarction due to embolism of left middle cerebral artery: Secondary | ICD-10-CM | POA: Diagnosis present

## 2018-02-13 DIAGNOSIS — D62 Acute posthemorrhagic anemia: Secondary | ICD-10-CM | POA: Diagnosis not present

## 2018-02-13 DIAGNOSIS — I252 Old myocardial infarction: Secondary | ICD-10-CM | POA: Diagnosis not present

## 2018-02-13 DIAGNOSIS — I6522 Occlusion and stenosis of left carotid artery: Principal | ICD-10-CM | POA: Diagnosis present

## 2018-02-13 DIAGNOSIS — G500 Trigeminal neuralgia: Secondary | ICD-10-CM | POA: Diagnosis present

## 2018-02-13 DIAGNOSIS — I2510 Atherosclerotic heart disease of native coronary artery without angina pectoris: Secondary | ICD-10-CM | POA: Diagnosis present

## 2018-02-13 DIAGNOSIS — Z7902 Long term (current) use of antithrombotics/antiplatelets: Secondary | ICD-10-CM | POA: Diagnosis not present

## 2018-02-13 DIAGNOSIS — K219 Gastro-esophageal reflux disease without esophagitis: Secondary | ICD-10-CM | POA: Diagnosis present

## 2018-02-13 DIAGNOSIS — E785 Hyperlipidemia, unspecified: Secondary | ICD-10-CM | POA: Diagnosis present

## 2018-02-13 DIAGNOSIS — Z9889 Other specified postprocedural states: Secondary | ICD-10-CM

## 2018-02-13 HISTORY — PX: ENDARTERECTOMY: SHX5162

## 2018-02-13 SURGERY — ENDARTERECTOMY, CAROTID
Anesthesia: General | Site: Neck | Laterality: Left

## 2018-02-13 MED ORDER — FENTANYL CITRATE (PF) 100 MCG/2ML IJ SOLN
INTRAMUSCULAR | Status: DC | PRN
  Administered 2018-02-13 (×2): 25 ug via INTRAVENOUS
  Administered 2018-02-13 (×2): 50 ug via INTRAVENOUS

## 2018-02-13 MED ORDER — PROMETHAZINE HCL 25 MG/ML IJ SOLN
2.5000 mg | Freq: Once | INTRAMUSCULAR | Status: AC
  Administered 2018-02-13: 2.5 mg via INTRAVENOUS

## 2018-02-13 MED ORDER — ONDANSETRON HCL 4 MG/2ML IJ SOLN: 4.0000 mg | Freq: Four times a day (QID) | INTRAMUSCULAR | Status: DC | PRN

## 2018-02-13 MED ORDER — PHENOL 1.4 % MT LIQD: 1.0000 | OROMUCOSAL | Status: DC | PRN

## 2018-02-13 MED ORDER — MAGNESIUM SULFATE 2 GM/50ML IV SOLN: 2.0000 g | Freq: Every day | INTRAVENOUS | Status: DC | PRN

## 2018-02-13 MED ORDER — CHLORHEXIDINE GLUCONATE CLOTH 2 % EX PADS: 6.0000 | MEDICATED_PAD | Freq: Once | CUTANEOUS | Status: DC

## 2018-02-13 MED ORDER — METOPROLOL TARTRATE 5 MG/5ML IV SOLN: 2.0000 mg | INTRAVENOUS | Status: DC | PRN

## 2018-02-13 MED ORDER — LIDOCAINE HCL (PF) 1 % IJ SOLN
INTRAMUSCULAR | Status: AC
  Filled 2018-02-13: qty 30

## 2018-02-13 MED ORDER — FENTANYL CITRATE (PF) 250 MCG/5ML IJ SOLN
INTRAMUSCULAR | Status: AC
  Filled 2018-02-13: qty 5

## 2018-02-13 MED ORDER — PROTAMINE SULFATE 10 MG/ML IV SOLN
INTRAVENOUS | Status: DC | PRN
  Administered 2018-02-13 (×3): 20 mg via INTRAVENOUS

## 2018-02-13 MED ORDER — LACTATED RINGERS IV SOLN
INTRAVENOUS | Status: DC | PRN
  Administered 2018-02-13 (×2): via INTRAVENOUS

## 2018-02-13 MED ORDER — SODIUM CHLORIDE 0.9 % IV SOLN
INTRAVENOUS | Status: DC
  Administered 2018-02-13: 14:00:00 via INTRAVENOUS

## 2018-02-13 MED ORDER — DEXAMETHASONE SODIUM PHOSPHATE 10 MG/ML IJ SOLN
INTRAMUSCULAR | Status: AC
  Filled 2018-02-13: qty 2

## 2018-02-13 MED ORDER — FENTANYL CITRATE (PF) 100 MCG/2ML IJ SOLN: 25.0000 ug | INTRAMUSCULAR | Status: DC | PRN

## 2018-02-13 MED ORDER — ASPIRIN 81 MG PO CHEW
81.0000 mg | CHEWABLE_TABLET | Freq: Every day | ORAL | Status: DC
  Administered 2018-02-13 – 2018-02-14 (×2): 81 mg via ORAL
  Filled 2018-02-13 (×2): qty 1

## 2018-02-13 MED ORDER — SODIUM CHLORIDE 0.9 % IV SOLN
500.0000 mL | Freq: Once | INTRAVENOUS | Status: AC | PRN
  Administered 2018-02-13: 500 mL via INTRAVENOUS

## 2018-02-13 MED ORDER — ONDANSETRON HCL 4 MG/2ML IJ SOLN
INTRAMUSCULAR | Status: AC
  Filled 2018-02-13: qty 4

## 2018-02-13 MED ORDER — PANTOPRAZOLE SODIUM 40 MG PO TBEC
40.0000 mg | DELAYED_RELEASE_TABLET | Freq: Every day | ORAL | Status: DC
  Administered 2018-02-13 – 2018-02-14 (×2): 40 mg via ORAL
  Filled 2018-02-13 (×2): qty 1

## 2018-02-13 MED ORDER — LABETALOL HCL 5 MG/ML IV SOLN: 10.0000 mg | INTRAVENOUS | Status: DC | PRN

## 2018-02-13 MED ORDER — FENTANYL CITRATE (PF) 100 MCG/2ML IJ SOLN
INTRAMUSCULAR | Status: AC
  Filled 2018-02-13: qty 2

## 2018-02-13 MED ORDER — ROCURONIUM BROMIDE 50 MG/5ML IV SOSY
PREFILLED_SYRINGE | INTRAVENOUS | Status: DC | PRN
  Administered 2018-02-13: 40 mg via INTRAVENOUS
  Administered 2018-02-13: 10 mg via INTRAVENOUS

## 2018-02-13 MED ORDER — DOCUSATE SODIUM 100 MG PO CAPS
100.0000 mg | ORAL_CAPSULE | Freq: Every day | ORAL | Status: DC
  Administered 2018-02-14: 100 mg via ORAL
  Filled 2018-02-13: qty 1

## 2018-02-13 MED ORDER — SUGAMMADEX SODIUM 200 MG/2ML IV SOLN
INTRAVENOUS | Status: DC | PRN
  Administered 2018-02-13: 200 mg via INTRAVENOUS

## 2018-02-13 MED ORDER — HEMOSTATIC AGENTS (NO CHARGE) OPTIME
TOPICAL | Status: DC | PRN
  Administered 2018-02-13: 1 via TOPICAL

## 2018-02-13 MED ORDER — GABAPENTIN 300 MG PO CAPS
300.0000 mg | ORAL_CAPSULE | ORAL | Status: DC | PRN
  Administered 2018-02-14 (×2): 300 mg via ORAL
  Filled 2018-02-13 (×2): qty 1

## 2018-02-13 MED ORDER — HYDRALAZINE HCL 20 MG/ML IJ SOLN: 5.0000 mg | INTRAMUSCULAR | Status: DC | PRN

## 2018-02-13 MED ORDER — SODIUM CHLORIDE 0.9 % IV SOLN
INTRAVENOUS | Status: DC | PRN
  Administered 2018-02-13: 50 ug/min via INTRAVENOUS

## 2018-02-13 MED ORDER — GLYCOPYRROLATE PF 0.2 MG/ML IJ SOSY
PREFILLED_SYRINGE | INTRAMUSCULAR | Status: AC
  Filled 2018-02-13: qty 1

## 2018-02-13 MED ORDER — PROMETHAZINE HCL 25 MG/ML IJ SOLN: 6.2500 mg | INTRAMUSCULAR | Status: DC | PRN

## 2018-02-13 MED ORDER — FENTANYL CITRATE (PF) 100 MCG/2ML IJ SOLN
25.0000 ug | INTRAMUSCULAR | Status: DC | PRN
  Administered 2018-02-13 (×3): 25 ug via INTRAVENOUS

## 2018-02-13 MED ORDER — SODIUM CHLORIDE 0.9 % IV BOLUS
500.0000 mL | Freq: Once | INTRAVENOUS | Status: AC
  Administered 2018-02-13: 500 mL via INTRAVENOUS

## 2018-02-13 MED ORDER — CEFAZOLIN SODIUM-DEXTROSE 2-4 GM/100ML-% IV SOLN
2.0000 g | Freq: Three times a day (TID) | INTRAVENOUS | Status: AC
  Administered 2018-02-13 – 2018-02-14 (×2): 2 g via INTRAVENOUS
  Filled 2018-02-13 (×3): qty 100

## 2018-02-13 MED ORDER — CEFAZOLIN SODIUM-DEXTROSE 2-4 GM/100ML-% IV SOLN
2.0000 g | INTRAVENOUS | Status: AC
  Administered 2018-02-13: 2 g via INTRAVENOUS

## 2018-02-13 MED ORDER — HEPARIN SODIUM (PORCINE) 1000 UNIT/ML IJ SOLN
INTRAMUSCULAR | Status: DC | PRN
  Administered 2018-02-13: 6000 [IU] via INTRAVENOUS

## 2018-02-13 MED ORDER — POTASSIUM CHLORIDE CRYS ER 20 MEQ PO TBCR: 20.0000 meq | EXTENDED_RELEASE_TABLET | Freq: Every day | ORAL | Status: DC | PRN

## 2018-02-13 MED ORDER — ACETAMINOPHEN 325 MG PO TABS
325.0000 mg | ORAL_TABLET | ORAL | Status: DC | PRN
  Administered 2018-02-14 (×2): 650 mg via ORAL
  Filled 2018-02-13 (×2): qty 2

## 2018-02-13 MED ORDER — CEFAZOLIN SODIUM-DEXTROSE 2-4 GM/100ML-% IV SOLN
INTRAVENOUS | Status: AC
  Filled 2018-02-13: qty 100

## 2018-02-13 MED ORDER — PROMETHAZINE HCL 25 MG/ML IJ SOLN
INTRAMUSCULAR | Status: AC
  Filled 2018-02-13: qty 1

## 2018-02-13 MED ORDER — NAPHAZOLINE-GLYCERIN 0.012-0.2 % OP SOLN: 1.0000 [drp] | Freq: Four times a day (QID) | OPHTHALMIC | Status: DC | PRN

## 2018-02-13 MED ORDER — PROPOFOL 10 MG/ML IV BOLUS
INTRAVENOUS | Status: DC | PRN
  Administered 2018-02-13: 90 mg via INTRAVENOUS

## 2018-02-13 MED ORDER — SODIUM CHLORIDE 0.9 % IV SOLN
INTRAVENOUS | Status: DC | PRN
  Administered 2018-02-13: 08:00:00

## 2018-02-13 MED ORDER — LIDOCAINE 2% (20 MG/ML) 5 ML SYRINGE
INTRAMUSCULAR | Status: AC
  Filled 2018-02-13: qty 5

## 2018-02-13 MED ORDER — GUAIFENESIN-DM 100-10 MG/5ML PO SYRP: 15.0000 mL | ORAL_SOLUTION | ORAL | Status: DC | PRN

## 2018-02-13 MED ORDER — PROPOFOL 10 MG/ML IV BOLUS
INTRAVENOUS | Status: AC
  Filled 2018-02-13: qty 20

## 2018-02-13 MED ORDER — ONDANSETRON HCL 4 MG/2ML IJ SOLN
INTRAMUSCULAR | Status: DC | PRN
  Administered 2018-02-13: 4 mg via INTRAVENOUS

## 2018-02-13 MED ORDER — PHENYLEPHRINE HCL 10 MG/ML IJ SOLN
INTRAMUSCULAR | Status: DC | PRN
  Administered 2018-02-13 (×3): 40 ug via INTRAVENOUS

## 2018-02-13 MED ORDER — ATORVASTATIN CALCIUM 80 MG PO TABS
80.0000 mg | ORAL_TABLET | Freq: Every day | ORAL | Status: DC
  Administered 2018-02-13: 80 mg via ORAL
  Filled 2018-02-13: qty 1

## 2018-02-13 MED ORDER — LIDOCAINE 2% (20 MG/ML) 5 ML SYRINGE
INTRAMUSCULAR | Status: DC | PRN
  Administered 2018-02-13: 80 mg via INTRAVENOUS

## 2018-02-13 MED ORDER — ALUM & MAG HYDROXIDE-SIMETH 200-200-20 MG/5ML PO SUSP: 15.0000 mL | ORAL | Status: DC | PRN

## 2018-02-13 MED ORDER — SODIUM CHLORIDE 0.9 % IV SOLN
INTRAVENOUS | Status: AC
  Filled 2018-02-13: qty 1.2

## 2018-02-13 MED ORDER — 0.9 % SODIUM CHLORIDE (POUR BTL) OPTIME
TOPICAL | Status: DC | PRN
  Administered 2018-02-13: 1000 mL

## 2018-02-13 MED ORDER — DEXAMETHASONE SODIUM PHOSPHATE 10 MG/ML IJ SOLN
INTRAMUSCULAR | Status: DC | PRN
  Administered 2018-02-13: 10 mg via INTRAVENOUS

## 2018-02-13 MED ORDER — ACETAMINOPHEN 325 MG RE SUPP: 325.0000 mg | RECTAL | Status: DC | PRN

## 2018-02-13 MED ORDER — ROCURONIUM BROMIDE 50 MG/5ML IV SOSY
PREFILLED_SYRINGE | INTRAVENOUS | Status: AC
  Filled 2018-02-13: qty 5

## 2018-02-13 MED ORDER — GLYCOPYRROLATE 0.2 MG/ML IJ SOLN
INTRAMUSCULAR | Status: DC | PRN
  Administered 2018-02-13: 0.2 mg via INTRAVENOUS

## 2018-02-13 SURGICAL SUPPLY — 49 items
CANISTER SUCT 3000ML PPV (MISCELLANEOUS) ×2 IMPLANT
CANNULA VESSEL 3MM 2 BLNT TIP (CANNULA) ×4 IMPLANT
CATH ROBINSON RED A/P 18FR (CATHETERS) ×2 IMPLANT
CLIP VESOCCLUDE MED 6/CT (CLIP) ×2 IMPLANT
CLIP VESOCCLUDE SM WIDE 6/CT (CLIP) ×2 IMPLANT
COVER WAND RF STERILE (DRAPES) ×2 IMPLANT
CRADLE DONUT ADULT HEAD (MISCELLANEOUS) ×2 IMPLANT
DECANTER SPIKE VIAL GLASS SM (MISCELLANEOUS) IMPLANT
DERMABOND ADVANCED (GAUZE/BANDAGES/DRESSINGS) ×1
DERMABOND ADVANCED .7 DNX12 (GAUZE/BANDAGES/DRESSINGS) ×1 IMPLANT
DRAIN HEMOVAC 1/8 X 5 (WOUND CARE) ×1 IMPLANT
ELECT REM PT RETURN 9FT ADLT (ELECTROSURGICAL) ×2
ELECTRODE REM PT RTRN 9FT ADLT (ELECTROSURGICAL) ×1 IMPLANT
EVACUATOR SILICONE 100CC (DRAIN) ×1 IMPLANT
GAUZE SPONGE 2X2 8PLY STRL LF (GAUZE/BANDAGES/DRESSINGS) IMPLANT
GAUZE SPONGE 4X4 16PLY XRAY LF (GAUZE/BANDAGES/DRESSINGS) ×1 IMPLANT
GLOVE BIO SURGEON STRL SZ 6.5 (GLOVE) ×2 IMPLANT
GLOVE BIO SURGEON STRL SZ7 (GLOVE) ×1 IMPLANT
GLOVE BIO SURGEON STRL SZ7.5 (GLOVE) ×2 IMPLANT
GLOVE BIOGEL PI IND STRL 6.5 (GLOVE) IMPLANT
GLOVE BIOGEL PI IND STRL 7.0 (GLOVE) IMPLANT
GLOVE BIOGEL PI INDICATOR 6.5 (GLOVE) ×1
GLOVE BIOGEL PI INDICATOR 7.0 (GLOVE) ×1
GOWN STRL REUS W/ TWL LRG LVL3 (GOWN DISPOSABLE) ×3 IMPLANT
GOWN STRL REUS W/TWL LRG LVL3 (GOWN DISPOSABLE) ×3
HEMOSTAT SPONGE AVITENE ULTRA (HEMOSTASIS) ×1 IMPLANT
KIT BASIN OR (CUSTOM PROCEDURE TRAY) ×2 IMPLANT
KIT SHUNT ARGYLE CAROTID ART 6 (VASCULAR PRODUCTS) ×1 IMPLANT
KIT TURNOVER KIT B (KITS) ×2 IMPLANT
NDL HYPO 25GX1X1/2 BEV (NEEDLE) IMPLANT
NEEDLE HYPO 25GX1X1/2 BEV (NEEDLE) IMPLANT
NS IRRIG 1000ML POUR BTL (IV SOLUTION) ×4 IMPLANT
PACK CAROTID (CUSTOM PROCEDURE TRAY) ×2 IMPLANT
PAD ARMBOARD 7.5X6 YLW CONV (MISCELLANEOUS) ×4 IMPLANT
PATCH HEMASHIELD 8X75 (Vascular Products) ×1 IMPLANT
SHUNT CAROTID BYPASS 10 (VASCULAR PRODUCTS) ×1 IMPLANT
SHUNT CAROTID BYPASS 12FRX15.5 (VASCULAR PRODUCTS) IMPLANT
SPONGE GAUZE 2X2 STER 10/PKG (GAUZE/BANDAGES/DRESSINGS) ×1
SUT ETHILON 3 0 PS 1 (SUTURE) ×1 IMPLANT
SUT PROLENE 6 0 CC (SUTURE) ×3 IMPLANT
SUT SILK 3 0 TIES 17X18 (SUTURE)
SUT SILK 3-0 18XBRD TIE BLK (SUTURE) IMPLANT
SUT VIC AB 3-0 SH 27 (SUTURE) ×1
SUT VIC AB 3-0 SH 27X BRD (SUTURE) ×1 IMPLANT
SUT VICRYL 4-0 PS2 18IN ABS (SUTURE) ×2 IMPLANT
SYR CONTROL 10ML LL (SYRINGE) IMPLANT
TAPE CLOTH SURG 4X10 WHT LF (GAUZE/BANDAGES/DRESSINGS) ×1 IMPLANT
TOWEL GREEN STERILE (TOWEL DISPOSABLE) ×2 IMPLANT
WATER STERILE IRR 1000ML POUR (IV SOLUTION) ×2 IMPLANT

## 2018-02-13 NOTE — Anesthesia Procedure Notes (Signed)
Procedure Name: Intubation Date/Time: 02/13/2018 8:18 AM Performed by: Neldon Newport, CRNA Pre-anesthesia Checklist: Timeout performed, Patient being monitored, Suction available, Emergency Drugs available and Patient identified Patient Re-evaluated:Patient Re-evaluated prior to induction Oxygen Delivery Method: Circle system utilized Preoxygenation: Pre-oxygenation with 100% oxygen Induction Type: IV induction Ventilation: Mask ventilation without difficulty and Oral airway inserted - appropriate to patient size Laryngoscope Size: Mac and 3 Grade View: Grade I Tube type: Oral Tube size: 7.0 mm Number of attempts: 1 Placement Confirmation: breath sounds checked- equal and bilateral,  positive ETCO2 and ETT inserted through vocal cords under direct vision Secured at: 22 cm Tube secured with: Tape Dental Injury: Teeth and Oropharynx as per pre-operative assessment

## 2018-02-13 NOTE — Anesthesia Procedure Notes (Signed)
Arterial Line Insertion Start/End1/20/2020 7:10 AM, 02/13/2018 7:44 AM Performed by: Neldon Newport, CRNA, CRNA  Patient location: Pre-op. Preanesthetic checklist: patient identified, IV checked, risks and benefits discussed, surgical consent, monitors and equipment checked and timeout performed Lidocaine 1% used for infiltration Left, radial was placed Catheter size: 20 G Hand hygiene performed  and maximum sterile barriers used   Attempts: 4 Procedure performed without using ultrasound guided technique. Following insertion, dressing applied and Biopatch. Post procedure assessment: unchanged and normal  Patient tolerated the procedure well with no immediate complications. Additional procedure comments: Lelon Perla, CRNA placed Arterial line after two attempts by me.Marland Kitchen

## 2018-02-13 NOTE — Transfer of Care (Signed)
Immediate Anesthesia Transfer of Care Note  Patient: Debra Fry  Procedure(s) Performed: ENDARTERECTOMY CAROTID (Left Neck)  Patient Location: PACU  Anesthesia Type:General  Level of Consciousness: awake, alert  and oriented  Airway & Oxygen Therapy: Patient Spontanous Breathing and Patient connected to nasal cannula oxygen  Post-op Assessment: Report given to RN, Post -op Vital signs reviewed and stable and Patient moving all extremities X 4  Post vital signs: Reviewed and stable  Last Vitals:  Vitals Value Taken Time  BP 93/41 02/13/2018 11:37 AM  Temp    Pulse 58 02/13/2018 11:39 AM  Resp 15 02/13/2018 11:39 AM  SpO2 98 % 02/13/2018 11:39 AM  Vitals shown include unvalidated device data.  Last Pain: There were no vitals filed for this visit.       Complications: No apparent anesthesia complications

## 2018-02-13 NOTE — Progress Notes (Signed)
Pt seen in PACU.  Mentating at baseline, follows commands 5/5 motor bilat UE LE with tongue midline.  Minimal drain output no hematoma.  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  This is probably chronic.  Her vision is normal she has no symptoms of elevated ICP to suggest abducens compression  BP 90-100 will rebolus 1 liter NS if stays low then consider dopamine Shoot for SBP 100-170  Ruta Hinds, MD [facility] [place]: [phone] Pager: [phone]

## 2018-02-13 NOTE — Progress Notes (Signed)
Spoke with Dr. Oneida Alar and updated on patient status. Updated on patient's HR in the 40's. Ok to place regular diet order.

## 2018-02-13 NOTE — Op Note (Signed)
Procedure: Left carotid endarterectomy  Preoperative diagnosis: symptomatic left internal carotid artery stenosis  Postoperative diagnosis: Same  Anesthesia General  Asst.: Luanne Bras RNFA  Operative findings: #1 greater than 80% left internal carotid stenosis                                                             #2 Dacron patch           #3 8 Fr shunt                                #4 10 Fr JP  Operative details: After obtaining informed consent, the patient was taken to the operating room. The patient was placed in a supine position on the operating room table. After induction of general anesthesia, the patient's entire neck and chest was prepped and draped in the usual sterile fashion. An oblique incision was made on the left aspect of the patient's neck anterior to the border the left sternocleidomastoid muscle. The incision was carried into the subcutaneous tissues and through the platysma. The sternocleidomastoid muscle was identified and reflected laterally. The common carotid artery was then found at the base of the incision this was dissected free circumferentially. It was fairly soft on palpation.  The vagus nerve was identified and protected. Dissection was then carried up to the level carotid bifurcation.   The hyperglossal nerve was exposed at the distal internal carotid. The internal carotid artery was dissected free circumferentially just below the level of the hypoglossal nerve and it was soft in character at this location and above any palpable disease. This was fairly tedious due to several overlying lymph nodes entangled in the nerves.  A vessel loop was placed around the internal carotid artery. The vessel was fairly small. Next the external carotid and superior thyroid arteries were dissected free circumferentially and vessel loops were placed around these. The patient was given 6000 units of intravenous heparin.  After 2 minutes of circulation time and raising the mean  arterial pressure to 90 mm mercury, the distal internal carotid artery was controlled with a kitzmiller clamp. The external carotid and superior thyroid arteries were controlled with vessel loops. The common carotid artery was controlled with a peripheral DeBakey clamp. A longitudinal opening was made in the common carotid artery just below the bifurcation. The arteriotomy was extended distally up into the internal carotid with Potts scissors. There was a large calcified plaque with greater than 80% stenosis in the internal carotid with some ulceration.  A 10 Fr shunt was brought onto the field but it would not fit the patient's artery.  Therefore an 8 Fr shunt was threaded into the distal internal carotid artery and allowed to backbleed thoroughly.  There was good but not pulsatile backbleeding.  This was then threaded into the common carotid and secured with a Rummel tourniquet.   There was no air at this point and flow was restored to the brain.  Attention was then turned to the common carotid artery once again. A suitable endarterectomy plane was obtained and endarterectomy was begun in the common carotid artery and a good proximal endpoint was obtained. An eversion endarterectomy was performed on the external carotid artery and a good endpoint  was obtained. The plaque was then elevated in the internal carotid artery and a nice feathered distal endpoint was also obtained.  The plaque was passed off the table. All loose debris was then removed from the carotid bed and everything was thoroughly irrigated with heparinized saline. A Dacron patch was then brought on to the operative field and this was sewn on as a patch angioplasty using a running 6-0 Prolene suture. Prior to completion of the anastomosis the internal carotid artery was thoroughly backbled. This was then controlled again with a fine bulldog clamp.  The common carotid was thoroughly flushed forward. The external carotid was also thoroughly backbled.   The remainder of the patch was completed and the anastomosis was secured. Flow was then restored first retrograde from the external carotid into the carotid bed then antegrade from the common carotid to the external carotid artery and after approximately 5 cardiac cycles to the internal carotid artery. Doppler was used to evaluate the external/internal and common carotid arteries and these all had good Doppler flow. Hemostasis was obtained with avitene. The patient was also given 60 mg of Protamine.  A 10 flat JP was brought out through a separate stab at the base of the left neck and secured with a nylon stitch.    The platysma muscle was reapproximated using a running 3-0 Vicryl suture. The skin was closed with 4 0 Vicryl subcuticular stitch.  The patient was awakened in the operating room and was moving upper and lower extremities symmetrically and following commands.  The patient was stable on arrival to the PACU.  Ruta Hinds, MD [facility] [place] Office: [phone] Pager: [phone]

## 2018-02-13 NOTE — Progress Notes (Addendum)
Patient arrived to 4E room 14 at this time. Telemetry applied and CCMD notified. V/s and assessment done. Patient oriented to room and how to call the nurse with any needs.

## 2018-02-13 NOTE — Interval H&P Note (Signed)
History and Physical Interval Note:  02/13/2018 8:03 AM  Debra Fry A Arndt  has presented today for surgery, with the diagnosis of LEFT CAROTID STENOSIS  The various methods of treatment have been discussed with the patient and family. After consideration of risks, benefits and other options for treatment, the patient has consented to  Procedure(s): ENDARTERECTOMY CAROTID (Left) as a surgical intervention .  The patient's history has been reviewed, patient examined, no change in status, stable for surgery.  I have reviewed the patient's chart and labs.  Questions were answered to the patient's satisfaction.     Ruta Hinds

## 2018-02-13 NOTE — Anesthesia Postprocedure Evaluation (Signed)
Anesthesia Post Note  Patient: Debra Fry  Procedure(s) Performed: ENDARTERECTOMY CAROTID (Left Neck)     Patient location during evaluation: PACU Anesthesia Type: General Level of consciousness: awake and alert Pain management: pain level controlled Vital Signs Assessment: post-procedure vital signs reviewed and stable Respiratory status: spontaneous breathing, nonlabored ventilation, respiratory function stable and patient connected to nasal cannula oxygen Cardiovascular status: blood pressure returned to baseline and stable Postop Assessment: no apparent nausea or vomiting Anesthetic complications: no    Last Vitals:  Vitals:   02/13/18 1615 02/13/18 1630  BP: (!) 103/40 (!) 104/55  Pulse: (!) 50 (!) 52  Resp: 17 17  Temp:  36.5 C  SpO2: 99% 98%    Last Pain:  Vitals:   02/13/18 1630  PainSc: 0-No pain                 Tiajuana Amass

## 2018-02-14 ENCOUNTER — Telehealth: Payer: Self-pay | Admitting: Vascular Surgery

## 2018-02-14 ENCOUNTER — Encounter (HOSPITAL_COMMUNITY): Payer: Self-pay | Admitting: Vascular Surgery

## 2018-02-14 LAB — BASIC METABOLIC PANEL
Anion gap: 6 (ref 5–15)
BUN: 18 mg/dL (ref 8–23)
CO2: 22 mmol/L (ref 22–32)
Calcium: 8.4 mg/dL — ABNORMAL LOW (ref 8.9–10.3)
Chloride: 110 mmol/L (ref 98–111)
Creatinine, Ser: 0.81 mg/dL (ref 0.44–1.00)
GFR calc Af Amer: >60 mL/min (ref >60)
GFR calc non Af Amer: >60 mL/min (ref >60)
Glucose, Bld: 152 mg/dL — ABNORMAL HIGH (ref 70–99)
Potassium: 3.6 mmol/L (ref 3.5–5.1)
Sodium: 138 mmol/L (ref 135–145)

## 2018-02-14 LAB — CBC
HCT: 30.6 % — ABNORMAL LOW (ref 36.0–46.0)
Hemoglobin: 9.8 g/dL — ABNORMAL LOW (ref 12.0–15.0)
MCH: 28.7 pg (ref 26.0–34.0)
MCHC: 32 g/dL (ref 30.0–36.0)
MCV: 89.7 fL (ref 80.0–100.0)
Platelets: 177 10*3/uL (ref 150–400)
RBC: 3.41 MIL/uL — ABNORMAL LOW (ref 3.87–5.11)
RDW: 13.2 % (ref 11.5–15.5)
WBC: 9.3 10*3/uL (ref 4.0–10.5)
nRBC: 0 % (ref 0.0–0.2)

## 2018-02-14 MED ORDER — OXYCODONE-ACETAMINOPHEN 5-325 MG PO TABS: 1.0000 | ORAL_TABLET | Freq: Four times a day (QID) | ORAL | 0 refills | Status: DC | PRN

## 2018-02-14 NOTE — Care Management Note (Signed)
Case Management Note Marvetta Gibbons RN, BSN Transitions of Care Unit 4E- RN Case Manager [phone]  Patient Details  Name: Debra Fry MRN: [id number] Date of Birth: [birth year]  Subjective/Objective:      Pt admitted s/p CEA              Action/Plan: PTA pt lived at home with spouse, anticipate return home, no CM needs noted for transition home.   Expected Discharge Date:     02/14/18             Expected Discharge Plan:  Home/Self Care  In-House Referral:  NA  Discharge planning Services  CM Consult  Post Acute Care Choice:  NA Choice offered to:  NA  DME Arranged:    DME Agency:     HH Arranged:    HH Agency:     Status of Service:  Completed, signed off  If discussed at [place] of Stay Meetings, dates discussed:    Discharge Disposition: Home/self care   Additional Comments:  Dawayne Patricia, RN 02/14/2018, 10:36 AM

## 2018-02-14 NOTE — Discharge Instructions (Signed)
° °  [facility] [place] ° °Discharge Instructions °  °Carotid Endarterectomy (CEA) ° °Please refer to the following instructions for your post-procedure care. Your surgeon or physician assistant will discuss any changes with you. ° °Activity ° °You are encouraged to walk as much as you can. You can slowly return to normal activities but must avoid strenuous activity and heavy lifting until your doctor tell you it's okay. Avoid activities such as vacuuming or swinging a golf club. You can drive after one week if you are comfortable and you are no longer taking prescription pain medications. It is normal to feel tired for serval weeks after your surgery. It is also normal to have difficulty with sleep habits, eating, and bowel movements after surgery. These will go away with time. ° °Bathing/Showering ° °Shower daily after you go home. Do not soak in a bathtub, hot tub, or swim until the incision heals completely. ° °Incision Care ° °Shower every day. Clean your incision with mild soap and water. Pat the area dry with a clean towel. You do not need a bandage unless otherwise instructed. Do not apply any ointments or creams to your incision. You may have skin glue on your incision. Do not peel it off. It will come off on its own in about one week. Your incision may feel thickened and raised for several weeks after your surgery. This is normal and the skin will soften over time.  ° °For Men Only: It's okay to shave around the incision but do not shave the incision itself for 2 weeks. It is common to have numbness under your chin that could last for several months. ° °Diet ° °Resume your normal diet. There are no special food restrictions following this procedure. A low fat/low cholesterol diet is recommended for all patients with vascular disease. In order to heal from your surgery, it is CRITICAL to get adequate nutrition. Your body requires vitamins, minerals, and protein. Vegetables are the  best source of vitamins and minerals. Vegetables also provide the perfect balance of protein. Processed food has little nutritional value, so try to avoid this. ° °Medications ° °Resume taking all of your medications unless your doctor or physician assistant tells you not to. If your incision is causing pain, you may take over-the- counter pain relievers such as acetaminophen (Tylenol). If you were prescribed a stronger pain medication, please be aware these medications can cause nausea and constipation. Prevent nausea by taking the medication with a snack or meal. Avoid constipation by drinking plenty of fluids and eating foods with a high amount of fiber, such as fruits, vegetables, and grains.  °Do not take Tylenol if you are taking prescription pain medications. ° °Follow Up ° °Our office will schedule a follow up appointment 2-3 weeks following discharge. ° °Please call us immediately for any of the following conditions ° °Increased pain, redness, drainage (pus) from your incision site. °Fever of 101 degrees or higher. °If you should develop stroke (slurred speech, difficulty swallowing, weakness on one side of your body, loss of vision) you should call 911 and go to the nearest emergency room. ° °Reduce your risk of vascular disease: ° °Stop smoking. If you would like help call [facility] at 1-800-QUIT-NOW (1-800-784-8669) or [facility] at 336-586-4000. °Manage your cholesterol °Maintain a desired weight °Control your diabetes °Keep your blood pressure down ° °If you have any questions, please call the office at [phone]. ° °

## 2018-02-14 NOTE — Telephone Encounter (Signed)
-----   Message from Gabriel Earing, [place] sent at 02/14/2018  7:26 AM EST ----- S/p left CEA 02/13/2018.  F/u with Dr. Oneida Alar in 2-3 weeks.  Thanks

## 2018-02-14 NOTE — Discharge Summary (Addendum)
Discharge Summary     Tareva Leske Code [birth year] 78 y.o. female  [id number]  Admission Date: 02/13/2018  Discharge Date: 02/14/2018  Physician: Elam Dutch, MD  Admission Diagnosis: LEFT CAROTID STENOSIS   HPI:   This is a 78 y.o. 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  She now has completely normal function in that arm.  No prior stroke.  Symptoms lasted about 48 hrs. She was not on aspirin or statin prior to this event.  She has a history of CABG 2001. She has been followed by Neurology in the past for trigeminal neuralgia right side. for about  Other medical problems include hyperlipidemia.  She has chronic numbness and tingling in both hands especially at night.  She is currently on Lipitor, Plavix and aspirin.  Hospital Course:  The patient was admitted to the hospital and taken to the operating room on 02/13/2018 and underwent left carotid endarterectomy.    Findings: Operative findings: #1 greater than 80% left internal carotid stenosis                                                             #2 Dacron patch                                 #3 8 Fr shunt                                #4 10 Fr JP  The pt tolerated the procedure well and was transported to the PACU in good condition.   By POD 1, the pt neuro status was in tact.  She did have a headache while walking and was relieved with Tylenol and this was improved by morning.  She has voided, walked, and pain is controlled.  Her drain had only 40cc total and 20cc last shift.  This was removed.  Her Plavix is discontinued and she was sent home on asa only.  The remainder of the hospital course consisted of increasing mobilization and increasing intake of solids without difficulty.   Recent Labs    02/14/18 0414  NA 138  K 3.6  CL 110  CO2 22  GLUCOSE 152*  BUN 18  CALCIUM 8.4*   Recent Labs    02/14/18 0414  WBC 9.3  HGB 9.8*   HCT 30.6*  PLT 177   No results for input(s): INR in the last 72 hours.     Discharge Diagnosis:  LEFT CAROTID STENOSIS  Secondary Diagnosis: Patient Active Problem List   Diagnosis Date Noted  . 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 Acute CVA (cerebrovascular accident) ([place]) 01/27/2018   Past Medical History:  Diagnosis Date  . Coronary artery disease   . GERD (gastroesophageal reflux disease)   . Headache   . History of hiatal hernia   . Hyperlipidemia   . Myocardial infarction ([place]) 12/1999  . Stroke [facility])     Allergies as of 02/14/2018   No Known Allergies     Medication List    STOP taking these  medications   clopidogrel 75 MG tablet Commonly known as:  PLAVIX     TAKE these medications   aspirin 81 MG chewable tablet Chew 1 tablet (81 mg total) by mouth daily.   atorvastatin 80 MG tablet Commonly known as:  LIPITOR Take 1 tablet (80 mg total) by mouth daily at 6 PM.   gabapentin 300 MG capsule Commonly known as:  NEURONTIN TAKE 2 CAPSULES(600 MG) BY MOUTH THREE TIMES DAILY What changed:  See the new instructions.   oxyCODONE-acetaminophen 5-325 MG tablet Commonly known as:  PERCOCET Take 1 tablet by mouth every 6 (six) hours as needed for severe pain.   tetrahydrozoline-zinc 0.05-0.25 % ophthalmic solution Commonly known as:  VISINE-AC Place 2 drops into both eyes as needed (red eyes).        [facility] Discharge Instructions Carotid Endarterectomy (CEA)  Please refer to the following instructions for your post-procedure care. Your surgeon or physician assistant will discuss any changes with you.  Activity  You are encouraged to walk as much as you can. You can slowly return to normal activities but must avoid strenuous activity and heavy lifting until your doctor tell you it's OK. Avoid activities such as vacuuming or swinging a golf club. You can drive after one week if  you are comfortable and you are no longer taking prescription pain medications. It is normal to feel tired for serval weeks after your surgery. It is also normal to have difficulty with sleep habits, eating, and bowel movements after surgery. These will go away with time.  Bathing/Showering  You may shower after you come home. Do not soak in a bathtub, hot tub, or swim until the incision heals completely.  Incision Care  Shower every day. Clean your incision with mild soap and water. Pat the area dry with a clean towel. You do not need a bandage unless otherwise instructed. Do not apply any ointments or creams to your incision. You may have skin glue on your incision. Do not peel it off. It will come off on its own in about one week. Your incision may feel thickened and raised for several weeks after your surgery. This is normal and the skin will soften over time. For Men Only: It's OK to shave around the incision but do not shave the incision itself for 2 weeks. It is common to have numbness under your chin that could last for several months.  Diet  Resume your normal diet. There are no special food restrictions following this procedure. A low fat/low cholesterol diet is recommended for all patients with vascular disease. In order to heal from your surgery, it is CRITICAL to get adequate nutrition. Your body requires vitamins, minerals, and protein. Vegetables are the best source of vitamins and minerals. Vegetables also provide the perfect balance of protein. Processed food has little nutritional value, so try to avoid this.  Medications  Resume taking all of your medications unless your doctor or physician assistant tells you not to.  If your incision is causing pain, you may take over-the- counter pain relievers such as acetaminophen (Tylenol). If you were prescribed a stronger pain medication, please be aware these medications can cause nausea and constipation.  Prevent nausea by taking the  medication with a snack or meal. Avoid constipation by drinking plenty of fluids and eating foods with a high amount of fiber, such as fruits, vegetables, and grains.  Do not take Tylenol if you are taking prescription pain medications.  Follow Up  Our office will schedule a follow up appointment 2-3 weeks following discharge.  Please call us immediately for any of the following conditions  . Increased pain, redness, drainage (pus) from your incision site. . Fever of 101 degrees or higher. . If you should develop stroke (slurred speech, difficulty swallowing, weakness on one side of your body, loss of vision) you should call 911 and go to the nearest emergency room. .  Reduce your risk of vascular disease:  . Stop smoking. If you would like help call [facility] at 1-800-QUIT-NOW 231-163-0399) or [place] at ([phone]. . Manage your cholesterol . Maintain a desired weight . Control your diabetes . Keep your blood pressure down .  If you have any questions, please call the office at [phone].  Prescriptions given: 1.   Roxicet #8 No Refill  Disposition: home  Patient's condition: is Good  Follow up: 1. Dr. Oneida Alar in 2 weeks.   Leontine Locket, PA-C [facility] [phone]   --- For [facility] use ---   Modified Rankin score at D/C (0-6): 0  IV medication needed for:  1. Hypertension: No 2. Hypotension: No  Post-op Complications: No  1. Post-op CVA or TIA: No  If yes: Event classification (right eye, left eye, right cortical, left cortical, verterobasilar, other): n/a  If yes: Timing of event (intra-op, <6 hrs post-op, >=6 hrs post-op, unknown): n/a  2. CN injury: No  If yes: CN n/a injuried   3. Myocardial infarction: No  If yes: Dx by (EKG or clinical, Troponin): n/a  4.  CHF: No  5.  Dysrhythmia (new): No  6. Wound infection: No  7. Reperfusion symptoms: No  8. Return to OR: No  If yes: return to OR for  (bleeding, neurologic, other CEA incision, other): n/a  Discharge medications: Statin use:  Yes ASA use:  Yes   Beta blocker use:  No ACE-Inhibitor use:  No  ARB use:  No CCB use: No P2Y12 Antagonist use: Yes, [ x] Plavix, [ ]  Plasugrel, [ ]  Ticlopinine, [ ]  Ticagrelor, [ ]  Other, [ ]  No for medical reason, [ ]  Non-compliant, [ ]  Not-indicated Anti-coagulant use:  No, [ ]  Warfarin, [ ]  Rivaroxaban, [ ]  Dabigatran,

## 2018-02-14 NOTE — Telephone Encounter (Signed)
sch appt spk to pt mld lt r 03/09/2018 930am p/o MD

## 2018-02-14 NOTE — Progress Notes (Addendum)
  Progress Note    02/14/2018 7:20 AM 1 Day Post-Op  Subjective:  Says she had a headache on the top of her head when walking; not having any trouble swallowing and has walked and voided.   Tm 99.2  HR 40's-70's  916'B-846'K systolic 59% RA  Vitals:   02/13/18 2141 02/14/18 0422  BP: (!) 115/47 (!) 114/43  Pulse:  (!) 56  Resp: 16 (!) 21  Temp: 99.2 F (37.3 C) 99.1 F (37.3 C)  SpO2: 97% 98%     Physical Exam: Neuro:  In tact; moving extremities equally Lungs:  Non labored Incision:  Clean and dry  CBC    Component Value Date/Time   WBC 9.3 02/14/2018 0414   RBC 3.41 (L) 02/14/2018 0414   HGB 9.8 (L) 02/14/2018 0414   HCT 30.6 (L) 02/14/2018 0414   PLT 177 02/14/2018 0414   MCV 89.7 02/14/2018 0414   MCH 28.7 02/14/2018 0414   MCHC 32.0 02/14/2018 0414   RDW 13.2 02/14/2018 0414   LYMPHSABS 1.5 01/29/2018 0642   MONOABS 0.4 01/29/2018 0642   EOSABS 0.1 01/29/2018 0642   BASOSABS 0.0 01/29/2018 0642    BMET    Component Value Date/Time   NA 138 02/14/2018 0414   K 3.6 02/14/2018 0414   CL 110 02/14/2018 0414   CO2 22 02/14/2018 0414   GLUCOSE 152 (H) 02/14/2018 0414   BUN 18 02/14/2018 0414   CREATININE 0.81 02/14/2018 0414   CALCIUM 8.4 (L) 02/14/2018 0414   GFRNONAA >60 02/14/2018 0414   GFRAA >60 02/14/2018 0414     Intake/Output Summary (Last 24 hours) at 02/14/2018 0720 Last data filed at 02/14/2018 0500 Gross per 24 hour  Intake 2659 ml  Output 690 ml  Net 1969 ml    Drain output:  40cc total; 20cc last shift    Assessment/Plan:  This is a 78 y.o. female who is s/p left CEA 1 Day Post-Op  -pt is doing well this am.  She had a headache earlier and this was relieved with Tylenol.  She is not having any difficulty swallowing. -acute surgical blood loss anemia-tolerating -most likely dc drain-will defer to Dr. Oneida Alar -pt neuro exam is in tact -pt has ambulated -pt has voided -f/u with Dr. Oneida Alar in 2 weeks.   Leontine Locket,  PA-C [facility] [phone]   Agree with above.  I do not believe she needs dual anti platelet at this point since carotid has been treated.  Will stop plavix and use ASA alone.  D/c drain D/c home  Ruta Hinds, MD [facility] [place] Office: [phone] Pager: [phone]

## 2018-02-16 ENCOUNTER — Telehealth: Payer: Self-pay

## 2018-02-16 NOTE — Telephone Encounter (Signed)
Patient stated that she needs to reschedule her Feb appointment with Leonie Man. Its's a week hospital follow up. Her appointment is conflicting with her schedule. Please call back.

## 2018-02-16 NOTE — Telephone Encounter (Signed)
Nikki in referrals call patient back and r/s pt with Dr. Leonie Man on a different date.

## 2018-02-21 ENCOUNTER — Telehealth: Payer: Self-pay

## 2018-02-21 NOTE — Telephone Encounter (Signed)
Pt. Called with c/o headaches that since surgery that come and go and are mild. She stated that she took Tylenol and that seemed to alleviate it. She also stated her husband has been hospitalized and she feels a lot of "stress" and thinks that is contributing. I recommended she take Tylenol and if there was a way of checking her blood pressure. She feels it is not elevated. I instructed her to let us know if anything changes and if the intensity of the headache suddenly spikes in intensity, then she must seek medical attention immediately. Pt. Verbalized understanding, no further questions at this time.

## 2018-03-09 ENCOUNTER — Other Ambulatory Visit: Payer: Self-pay

## 2018-03-09 ENCOUNTER — Encounter: Payer: Self-pay | Admitting: Vascular Surgery

## 2018-03-09 ENCOUNTER — Ambulatory Visit: Payer: Self-pay | Admitting: Vascular Surgery

## 2018-03-09 VITALS — BP 127/64 | HR 77 | Temp 97.2°F | Resp 16 | Ht 62.0 in | Wt 130.0 lb

## 2018-03-09 DIAGNOSIS — I6522 Occlusion and stenosis of left carotid artery: Secondary | ICD-10-CM

## 2018-03-09 NOTE — Progress Notes (Signed)
Patient is a 78 year old female who returns for postoperative follow-up today regarding left carotid endarterectomy.  Preoperatively she experienced a left brain stroke with some right upper extremity weakness.  She has had no further symptoms regarding this and has made complete recovery.  She denies any incisional drainage.  She denies any symptoms of TIA amaurosis or stroke.  She currently is on aspirin.  She is also on a statin.  Current Outpatient Medications on File Prior to Visit  Medication Sig Dispense Refill  . aspirin 81 MG chewable tablet Chew 1 tablet (81 mg total) by mouth daily. 30 tablet 0  . atorvastatin (LIPITOR) 80 MG tablet Take 1 tablet (80 mg total) by mouth daily at 6 PM. 30 tablet 0  . tetrahydrozoline-zinc (VISINE-AC) 0.05-0.25 % ophthalmic solution Place 2 drops into both eyes as needed (red eyes).    . gabapentin (NEURONTIN) 300 MG capsule TAKE 2 CAPSULES(600 MG) BY MOUTH THREE TIMES DAILY (Patient not taking: No sig reported) 540 capsule 0  . oxyCODONE-acetaminophen (PERCOCET) 5-325 MG tablet Take 1 tablet by mouth every 6 (six) hours as needed for severe pain. (Patient not taking: Reported on 03/09/2018) 8 tablet 0   No current facility-administered medications on file prior to visit.     Physical exam:  Vitals:   03/09/18 0935 03/09/18 0936  BP: 122/60 127/64  Pulse: 77 77  Resp: 16   Temp: (!) 97.2 F (36.2 C)   TempSrc: Oral   SpO2: 97%   Weight: 130 lb (59 kg)   Height: 5\' 2"  (1.575 m)     Neck: Well-healed left neck scar no incisional drainage or erythema  Extremities: 2+ radial pulses  Neuro: Symmetric upper extremity lower extremity motor strength which is 5/5, no facial asymmetry, tongue midline  Assessment: Doing well status post left carotid endarterectomy for symptomatic carotid stenosis.  Plan: The patient will continue her aspirin and statin.  She will follow-up in 9 months time with a carotid duplex scan.  She will see our nurse  practitioner at that office visit.  Ruta Hinds, MD [facility] [place] Office: [phone] Pager: [phone]

## 2018-03-21 ENCOUNTER — Ambulatory Visit: Payer: Medicare HMO | Admitting: Neurology

## 2018-03-23 ENCOUNTER — Ambulatory Visit: Payer: Medicare HMO | Admitting: Neurology

## 2018-03-23 ENCOUNTER — Encounter: Payer: Self-pay | Admitting: Neurology

## 2018-03-23 ENCOUNTER — Other Ambulatory Visit: Payer: Self-pay | Admitting: Neurology

## 2018-03-23 VITALS — BP 114/64 | HR 75 | Ht 62.0 in | Wt 132.6 lb

## 2018-03-23 DIAGNOSIS — E7849 Other hyperlipidemia: Secondary | ICD-10-CM | POA: Diagnosis not present

## 2018-03-23 DIAGNOSIS — I63239 Cerebral infarction due to unspecified occlusion or stenosis of unspecified carotid arteries: Secondary | ICD-10-CM | POA: Diagnosis not present

## 2018-03-23 MED ORDER — COENZYME Q10 30 MG PO CAPS: 100.0000 mg | ORAL_CAPSULE | Freq: Every day | ORAL | 1 refills | Status: DC

## 2018-03-23 MED ORDER — ATORVASTATIN CALCIUM 80 MG PO TABS: 80.0000 mg | ORAL_TABLET | Freq: Every day | ORAL | 0 refills | Status: DC

## 2018-03-23 NOTE — Patient Instructions (Signed)
I had a long d/w patient and her daughter about her recent stroke,symptomatic carotid stenosis, carotid endarterectomy, risk for recurrent stroke/TIAs, personally independently reviewed imaging studies and stroke evaluation results and answered questions.Continue aspirin 81 mg daily  for secondary stroke prevention and maintain strict control of hypertension with blood pressure goal below 130/90, diabetes with hemoglobin A1c goal below 6.5% and lipids with LDL cholesterol goal below 70 mg/dL. I also advised the patient to eat a healthy diet with plenty of whole grains, cereals, fruits and vegetables, exercise regularly and maintain ideal body weight.  Check follow-up lipid profile and if LDL is not satisfactory may need to consider adding the new PCSK9 inhibitor injections.  She was given a refill for Lipitor and advised to see her primary care physician for subsequent lipid management.  Followup in the future with my nurse practitioner Janett Billow in 6 months or call earlier if necessary.   Stroke Prevention Some medical conditions and behaviors are associated with a higher chance of having a stroke. You can help prevent a stroke by making nutrition, lifestyle, and other changes, including managing any medical conditions you may have. What nutrition changes can be made?   Eat healthy foods. You can do this by: ? Choosing foods high in fiber, such as fresh fruits and vegetables and whole grains. ? Eating at least 5 or more servings of fruits and vegetables a day. Try to fill half of your plate at each meal with fruits and vegetables. ? Choosing lean protein foods, such as lean cuts of meat, poultry without skin, fish, tofu, beans, and nuts. ? Eating low-fat dairy products. ? Avoiding foods that are high in salt (sodium). This can help lower blood pressure. ? Avoiding foods that have saturated fat, trans fat, and cholesterol. This can help prevent high cholesterol. ? Avoiding processed and premade  foods.  Follow your health care provider's specific guidelines for losing weight, controlling high blood pressure (hypertension), lowering high cholesterol, and managing diabetes. These may include: ? Reducing your daily calorie intake. ? Limiting your daily sodium intake to 1,500 milligrams (mg). ? Using only healthy fats for cooking, such as olive oil, canola oil, or sunflower oil. ? Counting your daily carbohydrate intake. What lifestyle changes can be made?  Maintain a healthy weight. Talk to your health care provider about your ideal weight.  Get at least 30 minutes of moderate physical activity at least 5 days a week. Moderate activity includes brisk walking, biking, and swimming.  Do not use any products that contain nicotine or tobacco, such as cigarettes and e-cigarettes. If you need help quitting, ask your health care provider. It may also be helpful to avoid exposure to secondhand smoke.  Limit alcohol intake to no more than 1 drink a day for nonpregnant women and 2 drinks a day for men. One drink equals 12 oz of beer, 5 oz of wine, or 1 oz of hard liquor.  Stop any illegal drug use.  Avoid taking birth control pills. Talk to your health care provider about the risks of taking birth control pills if: ? You are over 53 years old. ? You smoke. ? You get migraines. ? You have ever had a blood clot. What other changes can be made?  Manage your cholesterol levels. ? Eating a healthy diet is important for preventing high cholesterol. If cholesterol cannot be managed through diet alone, you may also need to take medicines. ? Take any prescribed medicines to control your cholesterol as told by your  health care provider.  Manage your diabetes. ? Eating a healthy diet and exercising regularly are important parts of managing your blood sugar. If your blood sugar cannot be managed through diet and exercise, you may need to take medicines. ? Take any prescribed medicines to control  your diabetes as told by your health care provider.  Control your hypertension. ? To reduce your risk of stroke, try to keep your blood pressure below 130/80. ? Eating a healthy diet and exercising regularly are an important part of controlling your blood pressure. If your blood pressure cannot be managed through diet and exercise, you may need to take medicines. ? Take any prescribed medicines to control hypertension as told by your health care provider. ? Ask your health care provider if you should monitor your blood pressure at home. ? Have your blood pressure checked every year, even if your blood pressure is normal. Blood pressure increases with age and some medical conditions.  Get evaluated for sleep disorders (sleep apnea). Talk to your health care provider about getting a sleep evaluation if you snore a lot or have excessive sleepiness.  Take over-the-counter and prescription medicines only as told by your health care provider. Aspirin or blood thinners (antiplatelets or anticoagulants) may be recommended to reduce your risk of forming blood clots that can lead to stroke.  Make sure that any other medical conditions you have, such as atrial fibrillation or atherosclerosis, are managed. What are the warning signs of a stroke? The warning signs of a stroke can be easily remembered as BEFAST.  B is for balance. Signs include: ? Dizziness. ? Loss of balance or coordination. ? Sudden trouble walking.  E is for eyes. Signs include: ? A sudden change in vision. ? Trouble seeing.  F is for face. Signs include: ? Sudden weakness or numbness of the face. ? The face or eyelid drooping to one side.  A is for arms. Signs include: ? Sudden weakness or numbness of the arm, usually on one side of the body.  S is for speech. Signs include: ? Trouble speaking (aphasia). ? Trouble understanding.  T is for time. ? These symptoms may represent a serious problem that is an emergency. Do not  wait to see if the symptoms will go away. Get medical help right away. Call your local emergency services (911 in the [place] Do not drive yourself to the hospital.  Other signs of stroke may include: ? A sudden, severe headache with no known cause. ? Nausea or vomiting. ? Seizure. Where to find more information For more information, visit:  American Stroke Association: www.strokeassociation.org  National Stroke Association: www.stroke.org Summary  You can prevent a stroke by eating healthy, exercising, not smoking, limiting alcohol intake, and managing any medical conditions you may have.  Do not use any products that contain nicotine or tobacco, such as cigarettes and e-cigarettes. If you need help quitting, ask your health care provider. It may also be helpful to avoid exposure to secondhand smoke.  Remember BEFAST for warning signs of stroke. Get help right away if you or a loved one has any of these signs. This information is not intended to replace advice given to you by your health care provider. Make sure you discuss any questions you have with your health care provider. Document Released: 02/19/2004 Document Revised: 02/17/2016 Document Reviewed: 02/17/2016 Elsevier Interactive Patient Education  2019 [employer].

## 2018-03-23 NOTE — Progress Notes (Signed)
[facility] [address] [place]. [place] [address] [phone]       OFFICE FOLLOW-UP NOTE  [name] Date of Birth:  31-Aug-1940 Medical Record Number:  [id number]   HPI: Debra Fry is a 78 year old Caucasian lady seen today for initial office follow-up visit following hospital admission for stroke in January 2020.  History is obtained from the patient and review of electronic medical records.  I have personally reviewed imaging films.  [name] is a pleasant 78 year old Caucasian lady with history of coronary artery disease and hyperlipidemia who presented with sudden onset of right upper extremity numbness.  She was not a TPA candidate due to mild symptoms.  CT scan of the head showed no acute abnormalities.  CT angiogram showed acute infarction in the left frontoparietal junction with 75% stenosis at the left carotid bifurcation and 70% right vertebral origin stenosis.  Carotid ultrasound confirmed 65 to 80% calcific plaque in the left carotid bifurcation MRI scan of the brain confirmed small acute left perisylvian infarcts.  MRI scan cervical spine showed motion degradation but no significant spinal stenosis.  Transthoracic echo showed normal ejection fraction without cardiac source of embolism.  LDL cholesterol was elevated at 169 mg percent and hemoglobin A1c was 5.8.  Patient was found to have no therapy needs and did well and recovered completely.  She was discharged home after consultation with vascular surgery will arrange for elective left carotid endarterectomy on 02/13/2018 which went uneventfully.  Patient states she is done well without recurrent stroke or TIA symptoms.  She has had follow-up with Dr. Oneida Alar on 03/09/2018 and he seemed pleased with the results.  She is on aspirin for stroke prevention and tolerating it well without bleeding or bruising.  She is tolerating Lipitor 80 mg well but does complain of knee and joint pains.  She has not had any follow-up  lipid profile checked.  ROS:   14 system review of systems is positive for no complaints today and all systems negative  PMH:  Past Medical History:  Diagnosis Date  . Coronary artery disease   . GERD (gastroesophageal reflux disease)   . Headache   . History of hiatal hernia   . Hyperlipidemia   . Myocardial infarction ([place]) 12/1999  . Stroke [facility])     Social History:  Social History   Socioeconomic History  . Marital status: Married    Spouse name: Not on file  . Number of children: Not on file  . Years of education: Not on file  . Highest education level: Not on file  Occupational History  . Not on file  Social Needs  . Financial resource strain: Not on file  . Food insecurity:    Worry: Not on file    Inability: Not on file  . Transportation needs:    Medical: Not on file    Non-medical: Not on file  Tobacco Use  . Smoking status: Never Smoker  . Smokeless tobacco: Never Used  Substance and Sexual Activity  . Alcohol use: No  . Drug use: No  . Sexual activity: Not on file  Lifestyle  . Physical activity:    Days per week: Not on file    Minutes per session: Not on file  . Stress: Not on file  Relationships  . Social connections:    Talks on phone: Not on file    Gets together: Not on file    Attends religious service: Not on file  Active member of club or organization: Not on file    Attends meetings of clubs or organizations: Not on file    Relationship status: Not on file  . Intimate partner violence:    Fear of current or ex partner: Not on file    Emotionally abused: Not on file    Physically abused: Not on file    Forced sexual activity: Not on file  Other Topics Concern  . Not on file  Social History Narrative  . Not on file    Medications:   Current Outpatient Medications on File Prior to Visit  Medication Sig Dispense Refill  . aspirin 81 MG chewable tablet Chew 1 tablet (81 mg total) by mouth daily. 30 tablet 0  . gabapentin  (NEURONTIN) 300 MG capsule TAKE 2 CAPSULES(600 MG) BY MOUTH THREE TIMES DAILY 540 capsule 0  . oxyCODONE-acetaminophen (PERCOCET) 5-325 MG tablet Take 1 tablet by mouth every 6 (six) hours as needed for severe pain. 8 tablet 0  . tetrahydrozoline-zinc (VISINE-AC) 0.05-0.25 % ophthalmic solution Place 2 drops into both eyes as needed (red eyes).     No current facility-administered medications on file prior to visit.     Allergies:  No Known Allergies  Physical Exam General: well developed, well nourished elderly Caucasian lady, seated, in no evident distress Head: head normocephalic and atraumatic.  Neck: supple with no carotid or supraclavicular bruits.  Healed surgical scar on the left from carotid surgery Cardiovascular: regular rate and rhythm, no murmurs Musculoskeletal: no deformity Skin:  no rash/petichiae Vascular:  Normal pulses all extremities Vitals:   03/23/18 0936  BP: 114/64  Pulse: 75   Neurologic Exam Mental Status: Awake and fully alert. Oriented to place and time. Recent and remote memory intact. Attention span, concentration and fund of knowledge appropriate. Mood and affect appropriate.  Cranial Nerves: Fundoscopic exam reveals sharp disc margins. Pupils equal, briskly reactive to light. Extraocular movements full without nystagmus. Visual fields full to confrontation. Hearing intact. Facial sensation intact. Face, tongue, palate moves normally and symmetrically.  Motor: Normal bulk and tone. Normal strength in all tested extremity muscles. Sensory.: intact to touch ,pinprick .position and vibratory sensation.  Coordination: Rapid alternating movements normal in all extremities. Finger-to-nose and heel-to-shin performed accurately bilaterally. Gait and Station: Arises from chair without difficulty. Stance is normal. Gait demonstrates normal stride length and balance . Able to heel, toe and tandem walk without difficulty.  Reflexes: 1+ and symmetric. Toes downgoing.    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  Status post interval elective left carotid endarterectomy on 02/13/2018 who is doing clinically quite well with full recovery.  Vascular risk factors of hyperlipidemia and carotid stenosis only.     PLAN: I had a long d/w patient and her daughter about her recent stroke,symptomatic carotid stenosis, carotid endarterectomy, risk for recurrent stroke/TIAs, personally independently reviewed imaging studies and stroke evaluation results and answered questions.Continue aspirin 81 mg daily  for secondary stroke prevention and maintain strict control of hypertension with blood pressure goal below 130/90, diabetes with hemoglobin A1c goal below 6.5% and lipids with LDL cholesterol goal below 70 mg/dL. I also advised the patient to eat a healthy diet with plenty of whole grains, cereals, fruits and vegetables, exercise regularly and maintain ideal body weight.  Check follow-up lipid profile and if LDL is not satisfactory may need to consider adding the new  PCSK9 inhibitor injections.  She was given a refill for Lipitor and advised to see her primary care physician for subsequent lipid management.  Followup in the future with my nurse practitioner Janett Billow in 6 months or call earlier if necessary. Greater than 50% of time during this 25 minute visit was spent on counseling,explanation of diagnosis of stroke, planning of further management, discussion with patient and family and coordination of care Antony Contras, MD Medical Director [place] Pager: [phone] 03/23/2018 3:06 PM Note: This document was prepared with digital dictation and possible smart phrase technology. Any transcriptional errors that result from this process are unintentional

## 2018-03-24 ENCOUNTER — Other Ambulatory Visit: Payer: Self-pay | Admitting: Neurology

## 2018-03-24 LAB — LIPID PANEL
CHOLESTEROL TOTAL: 169 mg/dL (ref 100–199)
Chol/HDL Ratio: 3.8 ratio (ref 0.0–4.4)
HDL: 44 mg/dL (ref >39)
LDL Calculated: 102 mg/dL — ABNORMAL HIGH (ref 0–99)
Triglycerides: 117 mg/dL (ref 0–149)
VLDL Cholesterol Cal: 23 mg/dL (ref 5–40)

## 2018-03-24 LAB — HEPATIC FUNCTION PANEL
ALT: 16 IU/L (ref 0–32)
AST: 18 IU/L (ref 0–40)
Albumin: 4 g/dL (ref 3.7–4.7)
Alkaline Phosphatase: 102 IU/L (ref 39–117)
Bilirubin Total: 0.7 mg/dL (ref 0.0–1.2)
Bilirubin, Direct: 0.19 mg/dL (ref 0.00–0.40)
Total Protein: 7 g/dL (ref 6.0–8.5)

## 2018-03-24 MED ORDER — EVOLOCUMAB 140 MG/ML ~~LOC~~ SOSY: 140.0000 mg | PREFILLED_SYRINGE | Freq: Once | SUBCUTANEOUS | 3 refills | Status: AC

## 2018-03-27 ENCOUNTER — Encounter: Payer: Self-pay | Admitting: *Deleted

## 2018-03-27 ENCOUNTER — Other Ambulatory Visit: Payer: Self-pay

## 2018-03-27 ENCOUNTER — Telehealth: Payer: Self-pay | Admitting: *Deleted

## 2018-03-27 DIAGNOSIS — E7849 Other hyperlipidemia: Secondary | ICD-10-CM | POA: Insufficient documentation

## 2018-03-27 MED ORDER — EVOLOCUMAB 140 MG/ML ~~LOC~~ SOAJ: 140.0000 mg | SUBCUTANEOUS | 3 refills | Status: DC

## 2018-03-27 NOTE — Telephone Encounter (Signed)
PA for Repatha 140mg /ml, #2 syringes/30 days completed via CoverMyMeds. Key# [id number]. Dx: High cholesterol (E78.5), Stroke (I63.9).Debra Fry

## 2018-03-27 NOTE — Progress Notes (Signed)
rea

## 2018-03-28 NOTE — Telephone Encounter (Signed)
PA approve for Repatha thru 01/24/2019.Customer care number is [phone].

## 2018-03-28 NOTE — Telephone Encounter (Addendum)
Medication was discontinue for repatha syringes.PA deleted for syringe. The pt was change to Repatha sure click 140mg /ml auto injectors. PA was done via telephone at [phone]. PA# [id number]. Decision will be made within 24 to 72 hours.

## 2018-03-29 ENCOUNTER — Ambulatory Visit (INDEPENDENT_AMBULATORY_CARE_PROVIDER_SITE_OTHER): Payer: Medicare HMO | Admitting: Family Medicine

## 2018-03-29 NOTE — Telephone Encounter (Signed)
I receive call from patient about her [place]. I stated it was approve till 12/2018. PT stated she is scared of needles and will ask her family member to do her injection twice a month. I stated to pt if she is unable to afford the medication we can refer her to patient assistance. Also if she can afford the co payment pick up and call if she needs a demonstration. I explain it will be a one time nurse visit charge. Pt will call if she has any concerns.

## 2018-03-29 NOTE — Telephone Encounter (Signed)
Left vm for patient to call back about repatha being approve and directions on how to use ir.

## 2018-03-29 NOTE — Telephone Encounter (Signed)
Patient is returning call.  °

## 2018-03-31 ENCOUNTER — Telehealth: Payer: Self-pay | Admitting: Neurology

## 2018-04-03 ENCOUNTER — Ambulatory Visit (INDEPENDENT_AMBULATORY_CARE_PROVIDER_SITE_OTHER): Payer: Medicare HMO | Admitting: Family Medicine

## 2018-04-03 NOTE — Telephone Encounter (Signed)
error 

## 2018-04-20 ENCOUNTER — Other Ambulatory Visit: Payer: Self-pay | Admitting: Neurology

## 2018-05-08 ENCOUNTER — Other Ambulatory Visit: Payer: Self-pay | Admitting: Neurology

## 2018-05-10 ENCOUNTER — Telehealth: Payer: Self-pay | Admitting: Neurology

## 2018-05-10 ENCOUNTER — Other Ambulatory Visit: Payer: Self-pay

## 2018-05-10 MED ORDER — ATORVASTATIN CALCIUM 80 MG PO TABS: 80.0000 mg | ORAL_TABLET | Freq: Every day | ORAL | 1 refills | Status: DC

## 2018-05-10 NOTE — Telephone Encounter (Signed)
I called pts daughter about her mom needing refills on her lipitor and trying to find a PCP.I stated in Feb 2020 it was stress by Dr. Leonie Man that her mom finds a PCP to manage her blood pressure, cholesterol and other meds. The daughter stated she has been looking but some are not accepting new pts  I stated Dr. Leonie Man will be notified about the temp refills. I advise pts daughter that its very important she finds a PCP asap. The daughter verbalized understanding.

## 2018-05-10 NOTE — Telephone Encounter (Signed)
Pt daughter(on DPR) is calling re: pt's atorvastatin (LIPITOR) 80 MG tablet.  Daughter is still looking to find pt a PCP.  Daughter is asking if Dr Leonie Man will continue to fill until she has been able to locate a PCP for pt.  Daughter is asking it be filled at [place] [phone]

## 2018-05-10 NOTE — Telephone Encounter (Signed)
Per Dr.Sethi he will do a temp refill of 2 month on lipitor.After refills end the PCP should manage.

## 2018-07-13 ENCOUNTER — Other Ambulatory Visit: Payer: Self-pay | Admitting: Neurology

## 2018-07-19 ENCOUNTER — Other Ambulatory Visit: Payer: Self-pay

## 2018-07-19 MED ORDER — ATORVASTATIN CALCIUM 80 MG PO TABS: 80.0000 mg | ORAL_TABLET | Freq: Every day | ORAL | 0 refills | Status: DC

## 2018-09-21 ENCOUNTER — Encounter: Payer: Self-pay | Admitting: Adult Health

## 2018-09-21 ENCOUNTER — Other Ambulatory Visit: Payer: Self-pay

## 2018-09-21 ENCOUNTER — Ambulatory Visit (INDEPENDENT_AMBULATORY_CARE_PROVIDER_SITE_OTHER): Payer: Medicare HMO | Admitting: Adult Health

## 2018-09-21 VITALS — BP 126/73 | HR 68 | Temp 96.2°F | Ht 62.0 in | Wt 132.0 lb

## 2018-09-21 DIAGNOSIS — I63239 Cerebral infarction due to unspecified occlusion or stenosis of unspecified carotid arteries: Secondary | ICD-10-CM | POA: Diagnosis not present

## 2018-09-21 DIAGNOSIS — E785 Hyperlipidemia, unspecified: Secondary | ICD-10-CM | POA: Diagnosis not present

## 2018-09-21 DIAGNOSIS — I63412 Cerebral infarction due to embolism of left middle cerebral artery: Secondary | ICD-10-CM | POA: Diagnosis not present

## 2018-09-21 DIAGNOSIS — I10 Essential (primary) hypertension: Secondary | ICD-10-CM

## 2018-09-21 DIAGNOSIS — Z9889 Other specified postprocedural states: Secondary | ICD-10-CM | POA: Diagnosis not present

## 2018-09-21 NOTE — Progress Notes (Signed)
[facility] [address] [place]. [place] [address] [phone]       OFFICE FOLLOW-UP NOTE  Ms. Maricruz Muhlestein Dehner Date of Birth:  February 14, 1940 Medical Record Number:  [id number]   Chief Complaint  Patient presents with   Follow-up    6 mon f/u. Grandson present. Rm 9. No new concerns at this time.      HPI:  Initial visit 03/23/2018 PS: Ms. Reeh is a 78 year old Caucasian lady seen today for initial office follow-up visit following hospital admission for stroke in January 2020.  History is obtained from the patient and review of electronic medical records.  I have personally reviewed imaging films.  [name] is a pleasant 79 year old Caucasian lady with history of coronary artery disease and hyperlipidemia who presented with sudden onset of right upper extremity numbness.  She was not a TPA candidate due to mild symptoms.  CT scan of the head showed no acute abnormalities.  CT angiogram showed acute infarction in the left frontoparietal junction with 75% stenosis at the left carotid bifurcation and 70% right vertebral origin stenosis.  Carotid ultrasound confirmed 65 to 80% calcific plaque in the left carotid bifurcation MRI scan of the brain confirmed small acute left perisylvian infarcts.  MRI scan cervical spine showed motion degradation but no significant spinal stenosis.  Transthoracic echo showed normal ejection fraction without cardiac source of embolism.  LDL cholesterol was elevated at 169 mg percent and hemoglobin A1c was 5.8.  Patient was found to have no therapy needs and did well and recovered completely.  She was discharged home after consultation with vascular surgery will arrange for elective left carotid endarterectomy on 02/13/2018 which went uneventfully.  Patient states she is done well without recurrent stroke or TIA symptoms.  She has had follow-up with Dr. Oneida Alar on 03/09/2018 and he seemed pleased with the results.  She is on aspirin for stroke prevention and  tolerating it well without bleeding or bruising.  She is tolerating Lipitor 80 mg well but does complain of knee and joint pains.  She has not had any follow-up lipid profile checked.  Update 09/21/2018: Ms. Schnall is a 78 year old female who is being seen today for 62-month stroke follow-up accompanied by her grandson.  She has been stable from a stroke standpoint without new or reoccurring symptoms or residual deficit from prior stroke.  Continues on aspirin without bleeding or bruising.  Lipid panel obtained at prior visit with LDL 102.  It was recommended to initiate Repatha but per patient, she attempted to pick up at her pharmacy and she was told that her insurance did not approve despite PA approval.  She has continued on atorvastatin 80 mg daily at this time and does complain of lower extremity cramping/pain typically worsened by increased exertion.  She has not had any repeat lipid levels at this time as she has not establish care with PCP.  Blood pressure today satisfactory at 126/73.  She does endorse contacting Dr. Eunice Blase office who patient reports being accepted as a patient but insurance requesting referral to be placed.  Denies new or worsening stroke/TIA symptoms.    ROS:   14 system review of systems is positive for eye redness, blurred vision, hearing loss, ear pain, daytime sleepiness, back pain, muscle cramps, loss and headaches today and all systems negative  PMH:  Past Medical History:  Diagnosis Date   Coronary artery disease    GERD (gastroesophageal reflux disease)    Headache    History  of hiatal hernia    Hyperlipidemia    Myocardial infarction ([place]) 12/1999   Stroke [facility])     Social History:  Social History   Socioeconomic History   Marital status: Married    Spouse name: Not on file   Number of children: Not on file   Years of education: Not on file   Highest education level: Not on file  Occupational History   Not on file  Social Needs    Financial resource strain: Not on file   Food insecurity    Worry: Not on file    Inability: Not on file   Transportation needs    Medical: Not on file    Non-medical: Not on file  Tobacco Use   Smoking status: Never Smoker   Smokeless tobacco: Never Used  Substance and Sexual Activity   Alcohol use: No   Drug use: No   Sexual activity: Not on file  Lifestyle   Physical activity    Days per week: Not on file    Minutes per session: Not on file   Stress: Not on file  Relationships   Social connections    Talks on phone: Not on file    Gets together: Not on file    Attends religious service: Not on file    Active member of club or organization: Not on file    Attends meetings of clubs or organizations: Not on file    Relationship status: Not on file   Intimate partner violence    Fear of current or ex partner: Not on file    Emotionally abused: Not on file    Physically abused: Not on file    Forced sexual activity: Not on file  Other Topics Concern   Not on file  Social History Narrative   Not on file    Medications:   Current Outpatient Medications on File Prior to Visit  Medication Sig Dispense Refill   aspirin 81 MG chewable tablet Chew 1 tablet (81 mg total) by mouth daily. 30 tablet 0   atorvastatin (LIPITOR) 80 MG tablet Take 1 tablet (80 mg total) by mouth daily at 6 PM. 90 tablet 0   Evolocumab (REPATHA SURECLICK) [id number] MG/ML SOAJ Inject 140 mg into the skin every 14 (fourteen) days. (Patient not taking: Reported on 09/21/2018) 2 pen 3   tetrahydrozoline-zinc (VISINE-AC) 0.05-0.25 % ophthalmic solution Place 2 drops into both eyes as needed (red eyes).     No current facility-administered medications on file prior to visit.     Allergies:  No Known Allergies   Today's Vitals   09/21/18 0849  BP: 126/73  Pulse: 68  Temp: (!) 96.2 F (35.7 C)  TempSrc: Oral  Weight: 132 lb (59.9 kg)  Height: 5\' 2"  (1.575 m)   Body mass index is 24.14  kg/m.  Physical Exam General: well developed, well nourished pleasant elderly Caucasian lady, seated, in no evident distress Head: head normocephalic and atraumatic.  Neck: supple with no carotid or supraclavicular bruits.  Healed surgical scar on the left from carotid surgery Cardiovascular: regular rate and rhythm, no murmurs Musculoskeletal: no deformity Skin:  no rash/petichiae   Neurologic Exam Mental Status: Awake and fully alert. Oriented to place and time. Recent and remote memory intact. Attention span, concentration and fund of knowledge appropriate. Mood and affect appropriate.  Cranial Nerves: Fundoscopic exam reveals sharp disc margins. Pupils equal, briskly reactive to light. Extraocular movements full without nystagmus. Visual fields full to confrontation. Hearing  intact. Facial sensation intact. Face, tongue, palate moves normally and symmetrically.  Motor: Normal bulk and tone. Normal strength in all tested extremity muscles. Sensory.: intact to touch ,pinprick .position and vibratory sensation.  Coordination: Rapid alternating movements normal in all extremities. Finger-to-nose and heel-to-shin performed accurately bilaterally. Gait and Station: Arises from chair without difficulty. Stance is normal. Gait demonstrates normal stride length and balance . Able to heel, toe and tandem walk without difficulty.  Reflexes: 1+ and symmetric. Toes downgoing.      ASSESSMENT: 89 year patient with left MCA branch infarct in January 2020 due to symptomatic proximal left carotid stenosis.  Status post interval elective left carotid endarterectomy on 02/13/2018 who is doing clinically quite well with full recovery.  Vascular risk factors of hyperlipidemia and carotid stenosis only.  Continues to be stable from a neurological and stroke standpoint.     PLAN: 1. Left MCA infarct: Continue aspirin 81 mg daily  and atorvastatin for secondary stroke prevention. Maintain strict control of  hypertension with blood pressure goal below 130/90, diabetes with hemoglobin A1c goal below 6.5% and cholesterol with LDL cholesterol (bad cholesterol) goal below 70 mg/dL.  I also advised the patient to eat a healthy diet with plenty of whole grains, cereals, fruits and vegetables, exercise regularly with at least 30 minutes of continuous activity daily and maintain ideal body weight. 2. HTN: Advised to continue current treatment regimen.  Today's BP 126/73.  Advised to continue to monitor at home along with continued follow-up with PCP for management 3. HLD: At prior visit, recommended initiating Repatha due to possible statin myalgias with continued elevated LDL.  Office obtained PA approval but per patient, she was told by her pharmacy that insurance had not approved therefore Repatha was not started.  Continues on atorvastatin with occasional lower extremity pain which has been present prior to initiating atorvastatin without worsening.  Recommend obtaining repeat lipid panel and if remains elevated despite ongoing atorvastatin use, Repatha issues will be looked into further.  If satisfactory, will continue atorvastatin with potential dose reduction. 4. Carotid stenosis s/p endarterectomy: Continue to follow with vascular surgery with recommended follow-up visit with repeat imaging around 11/2018 5. Patient attempting to establish care with Dr. 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 [place], [place] [address]  Phone [phone] Fax [phone] Note: This document was prepared with digital dictation and possible smart phrase technology. Any transcriptional errors that result from this process are unintentional.

## 2018-09-21 NOTE — Patient Instructions (Addendum)
Continue aspirin 81 mg daily for secondary stroke prevention  We will check your cholesterol levels today and recommend decreasing Lipitor dose to 40 mg over the weekend to see if this helps improve your muscle cramping.  Depending on your cholesterol level results, we may change therapy at that time or further look into issues receiving Repatha  Referral will be placed for you to establish care with Dr. Junius Roads  Continue to follow up with PCP regarding cholesterol and blood pressure management   Continue to monitor blood pressure at home  Maintain strict control of hypertension with blood pressure goal below 130/90, diabetes with hemoglobin A1c goal below 6.5% and cholesterol with LDL cholesterol (bad cholesterol) goal below 70 mg/dL. I also advised the patient to eat a healthy diet with plenty of whole grains, cereals, fruits and vegetables, exercise regularly and maintain ideal body weight.  Follow-up in 6 months or call earlier if needed       Thank you for coming to see [place] at [facility]. I hope we have been able to provide you high quality care today.  You may receive a patient satisfaction survey over the next few weeks. We would appreciate your feedback and comments so that we may continue to improve ourselves and the health of our patients.

## 2018-09-22 LAB — LIPID PANEL
Chol/HDL Ratio: 3.5 ratio (ref 0.0–4.4)
Cholesterol, Total: 140 mg/dL (ref 100–199)
HDL: 40 mg/dL (ref >39)
LDL Calculated: 83 mg/dL (ref 0–99)
Triglycerides: 83 mg/dL (ref 0–149)
VLDL Cholesterol Cal: 17 mg/dL (ref 5–40)

## 2018-09-23 ENCOUNTER — Encounter: Payer: Self-pay | Admitting: Adult Health

## 2018-09-25 ENCOUNTER — Telehealth: Payer: Self-pay | Admitting: *Deleted

## 2018-09-25 ENCOUNTER — Other Ambulatory Visit: Payer: Self-pay | Admitting: Adult Health

## 2018-09-25 DIAGNOSIS — Z7689 Persons encountering health services in other specified circumstances: Secondary | ICD-10-CM

## 2018-09-25 DIAGNOSIS — E785 Hyperlipidemia, unspecified: Secondary | ICD-10-CM

## 2018-09-25 DIAGNOSIS — I63412 Cerebral infarction due to embolism of left middle cerebral artery: Secondary | ICD-10-CM

## 2018-09-25 MED ORDER — EZETIMIBE 10 MG PO TABS: 10.0000 mg | ORAL_TABLET | Freq: Every day | ORAL | 2 refills | Status: DC

## 2018-09-25 NOTE — Progress Notes (Signed)
I agree with the above plan 

## 2018-09-25 NOTE — Progress Notes (Signed)
Per patient request, referral placed to establish PCP with Dr. Junius Roads who approved this referral.

## 2018-09-25 NOTE — Telephone Encounter (Signed)
-----   Message from Claris Gower, NP sent at 09/25/2018  8:41 AM EDT ----- Please advise patient that her recent cholesterol levels did show improvement but still above goal with LDL or bad cholesterol goal less than 70 for secondary stroke prevention.  At this time, it be recommended to assist with patient having difficulty obtaining Repatha or can add Zetia 10 mg daily in addition to atorvastatin 80 mg daily and recheck levels in 2 months.  Please let me know which she would prefer.  Thank you.

## 2018-09-25 NOTE — Telephone Encounter (Signed)
I called pt and relayed that the lab results per JM/NP that her cholesterol LDL improved but not below 70.  2 options: Add zetia 10mg  to take along with atorvastatin or repatha which is injectable every 2 wks.  She would like to do the oral medication and see how that goes. I placed order (open encounter so you can see it).  Repeat lab in 2 months.  She verbalized understanding.

## 2018-09-27 ENCOUNTER — Ambulatory Visit (INDEPENDENT_AMBULATORY_CARE_PROVIDER_SITE_OTHER): Payer: Medicare HMO | Admitting: Family Medicine

## 2018-09-27 ENCOUNTER — Encounter: Payer: Self-pay | Admitting: Family Medicine

## 2018-09-27 VITALS — BP 139/77 | HR 59

## 2018-09-27 DIAGNOSIS — E7849 Other hyperlipidemia: Secondary | ICD-10-CM | POA: Diagnosis not present

## 2018-09-27 DIAGNOSIS — R739 Hyperglycemia, unspecified: Secondary | ICD-10-CM | POA: Insufficient documentation

## 2018-09-27 DIAGNOSIS — H6122 Impacted cerumen, left ear: Secondary | ICD-10-CM

## 2018-09-27 DIAGNOSIS — I63412 Cerebral infarction due to embolism of left middle cerebral artery: Secondary | ICD-10-CM | POA: Diagnosis not present

## 2018-09-27 NOTE — Patient Instructions (Signed)
   Ear:  Use Peroxide and room temperature water, mix half and half.  Fill ear using syringe, let sit for 5-10 minutes.  Then drain into towel, and repeat if needed.  Vitamins to decrease leg pain:  - Coenzyme Q10:  Take 100 mg twice daily  - Vitamin D3:  Take 5,000 IU daily  - Magnesium:  Take 200-400 mg daily  Healthier eating:  - Try to minimize intake of processed carbohydrates (breads; pastas; cereals; sugars/sweets.  - Vegetables are good.  Meats and fish are good.  Nuts are a good snack.  Eat fruit in moderation (not too much, because they have sugar).

## 2018-09-27 NOTE — Progress Notes (Signed)
Office Visit Note   Patient: Debra Fry           Date of Birth: 10/09/1940           MRN: [id number] Visit Date: 09/27/2018 Requested by: Frann Rider, NP [place],  [place] [address] PCP: Patient, No Pcp Per  Subjective: Chief Complaint  Patient presents with  . establish primary care  . right ear pain and fullness  . discuss cholesterol medication    HPI: She is here to establish care.  She is a relative of Tristan Schroeder.  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  That day she was having weakness in her right hand when trying to clean a house.  Symptoms persisted after about 5 hours so she went to the hospital where she eventually had endarterectomy.  She has done well since then and has recovered her arm strength.  She did not have any speech or memory deficits.  She did not have any right leg deficits.  She has been placed on high-dose Lipitor with goal of LDL less than 70.  She is not yet at target so Zetia was added.  Lipitor causes severe leg pains and Zetia makes her right hand cramp.  She admits to skipping doses sometimes, and she feels much better on those days.  She would like to know what she can do to minimize her side effects.  She does have a history of open heart surgery, bypass.  This was years ago when she did well.  She is never been diagnosed with hypertension.  She has never been a smoker.  Looking back through her chart, she has had multiple labs since 2012 showing blood sugar in prediabetes range.  She admits to not eating healthfully.  Currently she has fullness in her right ear with difficulty hearing.  She thinks that she has earwax buildup that might need to be removed.               ROS: Denies fevers or chills.  All other systems were reviewed and are negative.  Objective: Vital Signs: BP 139/77 (BP Location: Left Arm, Patient Position: Sitting, Cuff Size: Normal)   Pulse (!) 59   Physical Exam:   General:  Alert and oriented, in no acute distress. Pulm:  Breathing unlabored. Psy:  Normal mood, congruent affect. Skin: No visible rash. HEENT:  [place]/AT, PERRLA, EOM Full, no nystagmus.  Funduscopic examination within normal limits.  No conjunctival erythema.  Tympanic membranes are pearly gray with normal landmarks.  External ear canals are normal on the left but occluded by cerumen on the right.  Nasal passages are clear.  Oropharynx is clear.  No significant lymphadenopathy.  No thyromegaly or nodules.  2+ carotid pulses without bruits. CV: Regular rate and rhythm without murmurs, rubs, or gallops.  No peripheral edema.  2+ radial and posterior tibial pulses. Lungs: Clear to auscultation throughout with no wheezing or areas of consolidation.    Imaging: None today.  Assessment & Plan: 1.  Doing well status post left middle cerebral artery stroke. -Monitored by neurology. -Encouraged her to continue walking for exercise as often as possible.  2.  Hyperlipidemia -Not tolerating medications but we will try adding coenzyme Q 10, vitamin D3 and magnesium to see if this helps.  If not, we might need to try different statin.  3.  Hyperglycemia -We had a lengthy discussion about the importance of healthy diet.  I  recommended minimizing intake of processed carbohydrates and sweets.  We should recheck some labs in 3 to 4 months.  4.  Right ear cerumen impaction -She will use peroxide and water at home.    Procedures: No procedures performed  No notes on file     PMFS History: Patient Active Problem List   Diagnosis Date Noted  . Hyperglycemia 09/27/2018  . 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 Acute CVA (cerebrovascular accident) ([place]) 01/27/2018   Past Medical History:  Diagnosis Date  . Coronary artery disease   . GERD (gastroesophageal reflux disease)   . Headache   . History of hiatal hernia   .  Hyperlipidemia   . Myocardial infarction ([place]) 12/1999  . Stroke [facility])     Family History  Family history unknown: Yes    Past Surgical History:  Procedure Laterality Date  . CARDIAC CATHETERIZATION  2001   results in [place] conversion  . CORONARY ARTERY BYPASS GRAFT  2001  . ENDARTERECTOMY Left 02/13/2018   Procedure: ENDARTERECTOMY CAROTID;  Surgeon: Elam Dutch, MD;  Location: [facility] OR;  Service: Vascular;  Laterality: Left;   Social History   Occupational History  . Not on file  Tobacco Use  . Smoking status: Never Smoker  . Smokeless tobacco: Never Used  Substance and Sexual Activity  . Alcohol use: No  . Drug use: No  . Sexual activity: Not on file

## 2018-10-15 ENCOUNTER — Other Ambulatory Visit: Payer: Self-pay | Admitting: Neurology

## 2018-10-16 ENCOUNTER — Other Ambulatory Visit: Payer: Self-pay

## 2018-10-16 MED ORDER — ATORVASTATIN CALCIUM 80 MG PO TABS: 80.0000 mg | ORAL_TABLET | Freq: Every day | ORAL | 1 refills | Status: DC

## 2018-11-14 ENCOUNTER — Ambulatory Visit (INDEPENDENT_AMBULATORY_CARE_PROVIDER_SITE_OTHER): Payer: Medicare HMO

## 2018-11-14 ENCOUNTER — Other Ambulatory Visit: Payer: Self-pay

## 2018-11-14 ENCOUNTER — Encounter (HOSPITAL_COMMUNITY): Payer: Self-pay | Admitting: Emergency Medicine

## 2018-11-14 ENCOUNTER — Ambulatory Visit (HOSPITAL_COMMUNITY)
Admission: EM | Admit: 2018-11-14 | Discharge: 2018-11-14 | Disposition: A | Discharge status: Home / Self Care | Payer: Medicare HMO | Attending: Internal Medicine | Admitting: Internal Medicine

## 2018-11-14 DIAGNOSIS — M25532 Pain in left wrist: Secondary | ICD-10-CM | POA: Diagnosis not present

## 2018-11-14 DIAGNOSIS — S52502A Unspecified fracture of the lower end of left radius, initial encounter for closed fracture: Secondary | ICD-10-CM

## 2018-11-14 DIAGNOSIS — S52602A Unspecified fracture of lower end of left ulna, initial encounter for closed fracture: Secondary | ICD-10-CM

## 2018-11-14 DIAGNOSIS — S00531A Contusion of lip, initial encounter: Secondary | ICD-10-CM

## 2018-11-14 DIAGNOSIS — W19XXXA Unspecified fall, initial encounter: Secondary | ICD-10-CM | POA: Diagnosis not present

## 2018-11-14 DIAGNOSIS — Y92009 Unspecified place in unspecified non-institutional (private) residence as the place of occurrence of the external cause: Secondary | ICD-10-CM

## 2018-11-14 MED ORDER — HYDROCODONE-ACETAMINOPHEN 2.5-325 MG PO TABS: 1.0000 | ORAL_TABLET | Freq: Three times a day (TID) | ORAL | 0 refills | Status: DC | PRN

## 2018-11-14 MED ORDER — ACETAMINOPHEN 325 MG PO TABS
650.0000 mg | ORAL_TABLET | Freq: Once | ORAL | Status: AC
  Administered 2018-11-14: 650 mg via ORAL

## 2018-11-14 MED ORDER — OXYCODONE-ACETAMINOPHEN 5-325 MG PO TABS: 1.0000 | ORAL_TABLET | Freq: Three times a day (TID) | ORAL | 0 refills | Status: AC | PRN

## 2018-11-14 MED ORDER — ACETAMINOPHEN 325 MG PO TABS
ORAL_TABLET | ORAL | Status: AC
  Filled 2018-11-14: qty 2

## 2018-11-14 NOTE — Progress Notes (Signed)
Orthopedic Tech Progress Note Patient Details:  Debra Fry 05-30-1940 [id number]  Ortho Devices Type of Ortho Device: Sugartong splint, Arm sling Ortho Device/Splint Location: ULE Ortho Device/Splint Interventions: Adjustment, Application, Ordered   Post Interventions Patient Tolerated: Well Instructions Provided: Care of device, Adjustment of device   Janit Pagan 11/14/2018, 3:31 PM

## 2018-11-14 NOTE — ED Notes (Signed)
Ortho at bedside.

## 2018-11-14 NOTE — ED Triage Notes (Signed)
Fell over a sewing machine cord.  Landed on face.  Patient has bruising to lips, but denies teeth being injured.  Left wrist radial pulse 2 +, cap refill brisk in nailbeds.

## 2018-11-14 NOTE — ED Provider Notes (Addendum)
MRN: [id number] DOB: Apr 16, 1940  Subjective:   Debra Fry is a 78 y.o. female presenting for acute onset this morning of left wrist pain with swelling from accidental fall.  Patient states that she tripped over her sewing machine cord, landed and broke her fall by putting out her left wrist, bumped her face as well.  Has had worsening moderate to severe pain of her left wrist with decreased range of motion.  Has only taken aspirin for pain relief.  She has a significant history of MI, stroke, CAD.   No current facility-administered medications for this encounter.   Current Outpatient Medications:  .  aspirin 81 MG chewable tablet, Chew 1 tablet (81 mg total) by mouth daily., Disp: 30 tablet, Rfl: 0    No Known Allergies   Past Medical History:  Diagnosis Date  . Coronary artery disease   . GERD (gastroesophageal reflux disease)   . Headache   . History of hiatal hernia   . Hyperlipidemia   . Myocardial infarction ([place]) 12/1999  . Stroke [facility])      Past Surgical History:  Procedure Laterality Date  . CARDIAC CATHETERIZATION  2001   results in [place] conversion  . CORONARY ARTERY BYPASS GRAFT  2001  . ENDARTERECTOMY Left 02/13/2018   Procedure: ENDARTERECTOMY CAROTID;  Surgeon: Elam Dutch, MD;  Location: [facility] OR;  Service: Vascular;  Laterality: Left;    ROS  Objective:   Vitals: BP (!) 153/64 (BP Location: Right Arm)   Pulse 75   Temp 98.7 F (37.1 C) (Oral)   Resp 20   SpO2 97%   Physical Exam Constitutional:      General: She is not in acute distress.    Appearance: Normal appearance. She is well-developed. She is not ill-appearing.  HENT:     Head: Normocephalic and atraumatic.     Nose: Nose normal.     Mouth/Throat:     Mouth: Mucous membranes are moist.     Pharynx: Oropharynx is clear.   Eyes:     General: No scleral icterus.    Extraocular Movements: Extraocular movements intact.     Pupils: Pupils are equal, round, and reactive to light.   Cardiovascular:     Rate and Rhythm: Normal rate.  Pulmonary:     Effort: Pulmonary effort is normal.  Musculoskeletal:     Left wrist: She exhibits decreased range of motion, tenderness, bony tenderness and swelling.       Arms:     Comments: Left radial pulse 2+, no ecchymosis.  Skin:    General: Skin is warm and dry.  Neurological:     General: No focal deficit present.     Mental Status: She is alert and oriented to person, place, and time.  Psychiatric:        Mood and Affect: Mood normal.        Behavior: Behavior normal.     Dg Wrist Complete Left  Result Date: 11/14/2018 CLINICAL DATA:  Recent trip and fall with wrist pain, initial encounter EXAM: LEFT WRIST - COMPLETE 3+ VIEW COMPARISON:  None. FINDINGS: Mildly impacted fractures of the distal radial and ulnar metaphyses are seen. Ulnar styloid fracture is noted as well. There is suggestion of extension of the radial fracture into the articular surface. Posterior displacement and angulation is noted at the fracture site within the distal radius. Soft tissue swelling is seen as well. No other fractures are noted. IMPRESSION: Distal radial and ulnar fractures as described. Electronically  Signed   By: Inez Catalina M.D.   On: 11/14/2018 14:28    Assessment and Plan :   1. Closed fracture of distal end of left radius, unspecified fracture morphology, initial encounter   2. Fall, initial encounter   3. Left wrist pain   4. Fall in home, initial encounter   5. Contusion of lip, initial encounter   6. Closed fracture of distal end of left ulna, unspecified fracture morphology, initial encounter     I was unable to reach the hand surgeon on-call.  Provided patient with information to his office.  Patient was placed in a sugar tong splint for left wrist.  She is to schedule Tylenol and use hydrocodone for breakthrough pain.  [place] controlled substance database reviewed and patient is low risk. Counseled patient on  potential for adverse effects with medications prescribed/recommended today, ER and return-to-clinic precautions discussed, patient verbalized understanding.    Jaynee Eagles, PA-C 11/14/18 1514   Pharmacy did not have 2.5mg  hydrocodone, will switch to 5mg  oxycodone.   Jaynee Eagles, PA-C 11/14/18 1520

## 2018-11-14 NOTE — Discharge Instructions (Addendum)
I was unable to get in touch with Dr. Claudia Desanctis today.  Please contact his office as soon as possible so that you can set up a consult regarding your left wrist fracture.  [facility] [address] [place], [place] [address] [phone]  Please schedule Tylenol at a dose of 500mg  to 650 mg once every 6 hours as needed for pain.  If he still have pain despite taking Tylenol on a regular basis, then you can use hydrocodone but please stop using this medicine once your pain is better controlled.

## 2018-11-15 ENCOUNTER — Other Ambulatory Visit: Payer: Self-pay | Admitting: Orthopedic Surgery

## 2018-11-16 ENCOUNTER — Other Ambulatory Visit: Payer: Self-pay

## 2018-11-16 ENCOUNTER — Institutional Professional Consult (permissible substitution): Payer: Medicare HMO | Admitting: Plastic Surgery

## 2018-11-16 ENCOUNTER — Encounter (HOSPITAL_BASED_OUTPATIENT_CLINIC_OR_DEPARTMENT_OTHER): Payer: Self-pay

## 2018-11-20 ENCOUNTER — Other Ambulatory Visit (HOSPITAL_COMMUNITY)
Admission: RE | Admit: 2018-11-20 | Discharge: 2018-11-20 | Disposition: A | Discharge status: Home / Self Care | Payer: Medicare HMO | Source: Ambulatory Visit | Attending: Orthopedic Surgery | Admitting: Orthopedic Surgery

## 2018-11-20 DIAGNOSIS — Z20828 Contact with and (suspected) exposure to other viral communicable diseases: Secondary | ICD-10-CM | POA: Diagnosis not present

## 2018-11-20 DIAGNOSIS — Z01812 Encounter for preprocedural laboratory examination: Secondary | ICD-10-CM | POA: Insufficient documentation

## 2018-11-20 NOTE — Progress Notes (Signed)

## 2018-11-21 LAB — NOVEL CORONAVIRUS, NAA (HOSP ORDER, SEND-OUT TO REF LAB; TAT 18-24 HRS): SARS-CoV-2, NAA: NOT DETECTED

## 2018-11-23 ENCOUNTER — Ambulatory Visit (HOSPITAL_BASED_OUTPATIENT_CLINIC_OR_DEPARTMENT_OTHER): Payer: Medicare HMO | Admitting: Certified Registered"

## 2018-11-23 ENCOUNTER — Ambulatory Visit (HOSPITAL_BASED_OUTPATIENT_CLINIC_OR_DEPARTMENT_OTHER)
Admission: RE | Admit: 2018-11-23 | Discharge: 2018-11-23 | Disposition: A | Discharge status: Home / Self Care | Payer: Medicare HMO | Attending: Orthopedic Surgery | Admitting: Orthopedic Surgery

## 2018-11-23 ENCOUNTER — Encounter (HOSPITAL_BASED_OUTPATIENT_CLINIC_OR_DEPARTMENT_OTHER)
Admission: RE | Disposition: A | Discharge status: Home / Self Care | Payer: Self-pay | Source: Home / Self Care | Attending: Orthopedic Surgery

## 2018-11-23 ENCOUNTER — Encounter (HOSPITAL_BASED_OUTPATIENT_CLINIC_OR_DEPARTMENT_OTHER): Payer: Self-pay

## 2018-11-23 ENCOUNTER — Other Ambulatory Visit: Payer: Self-pay

## 2018-11-23 DIAGNOSIS — I252 Old myocardial infarction: Secondary | ICD-10-CM | POA: Diagnosis not present

## 2018-11-23 DIAGNOSIS — S52572A Other intraarticular fracture of lower end of left radius, initial encounter for closed fracture: Secondary | ICD-10-CM | POA: Insufficient documentation

## 2018-11-23 DIAGNOSIS — Z8673 Personal history of transient ischemic attack (TIA), and cerebral infarction without residual deficits: Secondary | ICD-10-CM | POA: Diagnosis not present

## 2018-11-23 DIAGNOSIS — S59002A Unspecified physeal fracture of lower end of ulna, left arm, initial encounter for closed fracture: Secondary | ICD-10-CM | POA: Diagnosis not present

## 2018-11-23 DIAGNOSIS — I2510 Atherosclerotic heart disease of native coronary artery without angina pectoris: Secondary | ICD-10-CM | POA: Insufficient documentation

## 2018-11-23 DIAGNOSIS — Z951 Presence of aortocoronary bypass graft: Secondary | ICD-10-CM | POA: Insufficient documentation

## 2018-11-23 DIAGNOSIS — W1830XA Fall on same level, unspecified, initial encounter: Secondary | ICD-10-CM | POA: Insufficient documentation

## 2018-11-23 HISTORY — PX: OPEN REDUCTION INTERNAL FIXATION (ORIF) DISTAL RADIAL FRACTURE: SHX5989

## 2018-11-23 SURGERY — OPEN REDUCTION INTERNAL FIXATION (ORIF) DISTAL RADIUS FRACTURE
Anesthesia: Regional | Site: Wrist | Laterality: Left

## 2018-11-23 MED ORDER — ONDANSETRON HCL 4 MG/2ML IJ SOLN
INTRAMUSCULAR | Status: DC | PRN
  Administered 2018-11-23: 4 mg via INTRAVENOUS

## 2018-11-23 MED ORDER — CEFAZOLIN SODIUM-DEXTROSE 2-4 GM/100ML-% IV SOLN
2.0000 g | INTRAVENOUS | Status: AC
  Administered 2018-11-23: 2 g via INTRAVENOUS

## 2018-11-23 MED ORDER — PROPOFOL 500 MG/50ML IV EMUL
INTRAVENOUS | Status: DC | PRN
  Administered 2018-11-23: 75 ug/kg/min via INTRAVENOUS

## 2018-11-23 MED ORDER — OXYCODONE HCL 5 MG PO TABS: 5.0000 mg | ORAL_TABLET | Freq: Once | ORAL | Status: DC | PRN

## 2018-11-23 MED ORDER — CHLORHEXIDINE GLUCONATE 4 % EX LIQD: 60.0000 mL | Freq: Once | CUTANEOUS | Status: DC

## 2018-11-23 MED ORDER — EPHEDRINE SULFATE 50 MG/ML IJ SOLN
INTRAMUSCULAR | Status: DC | PRN
  Administered 2018-11-23 (×3): 5 mg via INTRAVENOUS

## 2018-11-23 MED ORDER — FENTANYL CITRATE (PF) 100 MCG/2ML IJ SOLN
INTRAMUSCULAR | Status: AC
  Filled 2018-11-23: qty 2

## 2018-11-23 MED ORDER — LACTATED RINGERS IV SOLN
INTRAVENOUS | Status: DC
  Administered 2018-11-23: 11:00:00 via INTRAVENOUS

## 2018-11-23 MED ORDER — CEFAZOLIN SODIUM-DEXTROSE 2-4 GM/100ML-% IV SOLN
INTRAVENOUS | Status: AC
  Filled 2018-11-23: qty 100

## 2018-11-23 MED ORDER — MIDAZOLAM HCL 2 MG/2ML IJ SOLN: 1.0000 mg | INTRAMUSCULAR | Status: DC | PRN

## 2018-11-23 MED ORDER — HYDROCODONE-ACETAMINOPHEN 5-325 MG PO TABS: ORAL_TABLET | ORAL | 0 refills | Status: DC

## 2018-11-23 MED ORDER — ONDANSETRON HCL 4 MG/2ML IJ SOLN: 4.0000 mg | Freq: Once | INTRAMUSCULAR | Status: DC | PRN

## 2018-11-23 MED ORDER — OXYCODONE HCL 5 MG/5ML PO SOLN: 5.0000 mg | Freq: Once | ORAL | Status: DC | PRN

## 2018-11-23 MED ORDER — MIDAZOLAM HCL 2 MG/2ML IJ SOLN
INTRAMUSCULAR | Status: AC
  Filled 2018-11-23: qty 2

## 2018-11-23 MED ORDER — FENTANYL CITRATE (PF) 100 MCG/2ML IJ SOLN
50.0000 ug | INTRAMUSCULAR | Status: AC | PRN
  Administered 2018-11-23: 25 ug via INTRAVENOUS
  Administered 2018-11-23: 12:00:00 50 ug via INTRAVENOUS
  Administered 2018-11-23: 25 ug via INTRAVENOUS

## 2018-11-23 MED ORDER — FENTANYL CITRATE (PF) 100 MCG/2ML IJ SOLN: 25.0000 ug | INTRAMUSCULAR | Status: DC | PRN

## 2018-11-23 MED ORDER — PHENYLEPHRINE HCL (PRESSORS) 10 MG/ML IV SOLN
INTRAVENOUS | Status: DC | PRN
  Administered 2018-11-23: 80 ug via INTRAVENOUS
  Administered 2018-11-23 (×2): 40 ug via INTRAVENOUS

## 2018-11-23 MED ORDER — ROPIVACAINE HCL 7.5 MG/ML IJ SOLN
INTRAMUSCULAR | Status: DC | PRN
  Administered 2018-11-23: 20 mL via PERINEURAL

## 2018-11-23 SURGICAL SUPPLY — 60 items
BIT DRILL 2.0 LNG QUCK RELEASE (BIT) IMPLANT
BIT DRILL 2.8X5 QR DISP (BIT) ×2 IMPLANT
BLADE SURG 15 STRL LF DISP TIS (BLADE) ×2 IMPLANT
BLADE SURG 15 STRL SS (BLADE) ×4
BNDG ELASTIC 3X5.8 VLCR STR LF (GAUZE/BANDAGES/DRESSINGS) ×3 IMPLANT
BNDG ESMARK 4X9 LF (GAUZE/BANDAGES/DRESSINGS) ×3 IMPLANT
BNDG GAUZE ELAST 4 BULKY (GAUZE/BANDAGES/DRESSINGS) ×3 IMPLANT
BNDG PLASTER X FAST 3X3 WHT LF (CAST SUPPLIES) ×30 IMPLANT
CHLORAPREP W/TINT 26 (MISCELLANEOUS) ×3 IMPLANT
CORD BIPOLAR FORCEPS 12FT (ELECTRODE) ×3 IMPLANT
COVER BACK TABLE REUSABLE LG (DRAPES) ×3 IMPLANT
COVER MAYO STAND REUSABLE (DRAPES) ×3 IMPLANT
COVER WAND RF STERILE (DRAPES) IMPLANT
CUFF TOURN SGL QUICK 18X4 (TOURNIQUET CUFF) ×2 IMPLANT
CUFF TOURN SGL QUICK 24 (TOURNIQUET CUFF)
CUFF TRNQT CYL 24X4X16.5-23 (TOURNIQUET CUFF) IMPLANT
DRAPE EXTREMITY T 121X128X90 (DISPOSABLE) ×3 IMPLANT
DRAPE OEC MINIVIEW 54X84 (DRAPES) ×3 IMPLANT
DRAPE SURG 17X23 STRL (DRAPES) ×3 IMPLANT
DRILL 2.0 LNG QUICK RELEASE (BIT) ×3
GAUZE SPONGE 4X4 12PLY STRL (GAUZE/BANDAGES/DRESSINGS) ×3 IMPLANT
GAUZE XEROFORM 1X8 LF (GAUZE/BANDAGES/DRESSINGS) ×3 IMPLANT
GLOVE BIO SURGEON STRL SZ 6.5 (GLOVE) ×1 IMPLANT
GLOVE BIO SURGEON STRL SZ7.5 (GLOVE) ×3 IMPLANT
GLOVE BIO SURGEONS STRL SZ 6.5 (GLOVE) ×1
GLOVE BIOGEL PI IND STRL 7.0 (GLOVE) IMPLANT
GLOVE BIOGEL PI IND STRL 8 (GLOVE) ×1 IMPLANT
GLOVE BIOGEL PI IND STRL 8.5 (GLOVE) IMPLANT
GLOVE BIOGEL PI INDICATOR 7.0 (GLOVE) ×4
GLOVE BIOGEL PI INDICATOR 8 (GLOVE) ×2
GLOVE BIOGEL PI INDICATOR 8.5 (GLOVE) ×2
GLOVE SURG ORTHO 8.0 STRL STRW (GLOVE) ×2 IMPLANT
GOWN STRL REUS W/ TWL LRG LVL3 (GOWN DISPOSABLE) ×1 IMPLANT
GOWN STRL REUS W/TWL LRG LVL3 (GOWN DISPOSABLE) ×2
GOWN STRL REUS W/TWL XL LVL3 (GOWN DISPOSABLE) ×5 IMPLANT
GUIDEWIRE ORTHO 0.054X6 (WIRE) ×6 IMPLANT
NDL HYPO 25X1 1.5 SAFETY (NEEDLE) IMPLANT
NEEDLE HYPO 25X1 1.5 SAFETY (NEEDLE) IMPLANT
NS IRRIG 1000ML POUR BTL (IV SOLUTION) ×3 IMPLANT
PACK BASIN DAY SURGERY FS (CUSTOM PROCEDURE TRAY) ×3 IMPLANT
PAD CAST 3X4 CTTN HI CHSV (CAST SUPPLIES) ×1 IMPLANT
PADDING CAST COTTON 3X4 STRL (CAST SUPPLIES) ×2
PLATE PROX NARROW LEFT (Plate) ×2 IMPLANT
SCREW ACTK 2 NL HEX 3.5.11 (Screw) ×2 IMPLANT
SCREW CORT FT 18X2.3XLCK HEX (Screw) IMPLANT
SCREW CORT FX14X2.3XLCK NS (Screw) IMPLANT
SCREW CORTICAL LOCKING 2.3X14M (Screw) ×2 IMPLANT
SCREW CORTICAL LOCKING 2.3X16M (Screw) ×4 IMPLANT
SCREW CORTICAL LOCKING 2.3X18M (Screw) ×6 IMPLANT
SCREW FX16X2.3XLCK SMTH NS CRT (Screw) IMPLANT
SCREW FX18X2.3XSMTH LCK NS CRT (Screw) IMPLANT
SCREW NONLOCK HEX 3.5X12 (Screw) ×4 IMPLANT
SLEEVE SCD COMPRESS KNEE MED (MISCELLANEOUS) ×2 IMPLANT
STOCKINETTE 4X48 STRL (DRAPES) ×3 IMPLANT
SUT ETHILON 4 0 PS 2 18 (SUTURE) ×3 IMPLANT
SUT VICRYL 4-0 PS2 18IN ABS (SUTURE) ×3 IMPLANT
SYR BULB 3OZ (MISCELLANEOUS) ×3 IMPLANT
SYR CONTROL 10ML LL (SYRINGE) IMPLANT
TOWEL GREEN STERILE FF (TOWEL DISPOSABLE) ×6 IMPLANT
UNDERPAD 30X36 HEAVY ABSORB (UNDERPADS AND DIAPERS) ×3 IMPLANT

## 2018-11-23 NOTE — Transfer of Care (Signed)
Immediate Anesthesia Transfer of Care Note  Patient: Debra Fry  Procedure(s) Performed: OPEN REDUCTION INTERNAL FIXATION (ORIF) LEFT DISTAL RADIAL AND ULNA  FRACTURES (Left Wrist)  Patient Location: PACU  Anesthesia Type:MAC combined with regional for post-op pain  Level of Consciousness: awake, alert  and oriented  Airway & Oxygen Therapy: Patient Spontanous Breathing and Patient connected to face mask oxygen  Post-op Assessment: Report given to RN and Post -op Vital signs reviewed and stable  Post vital signs: Reviewed and stable  Last Vitals:  Vitals Value Taken Time  BP 114/51 11/23/18 1309  Temp    Pulse 75 11/23/18 1310  Resp 22 11/23/18 1310  SpO2 100 % 11/23/18 1310  Vitals shown include unvalidated device data.  Last Pain:  Vitals:   11/23/18 1117  TempSrc: Oral  PainSc: 6          Complications: No apparent anesthesia complications

## 2018-11-23 NOTE — Discharge Instructions (Addendum)

## 2018-11-23 NOTE — Op Note (Signed)
I assisted Surgeon(s) and Role:    [name], MD - Primary on the Procedure(s): OPEN REDUCTION INTERNAL FIXATION (ORIF) LEFT DISTAL RADIAL AND ULNA  FRACTURES on 11/23/2018.  I provided assistance on this case as follows: setup, approach, isolation, debridement, reduction, stabilization and fixation of the fracture, closure and application of the dressings and splints.  Electronically signed by: Daryll Brod, MD Date: 11/23/2018 Time: 1:11 PM

## 2018-11-23 NOTE — Op Note (Addendum)
11/23/2018 [facility]  Operative Note  Pre Op Diagnosis: Left comminuted intraarticular distal radius fracture, left distal ulna metaphyseal fracture  Post Op Diagnosis: Left comminuted intraarticular distal radius fracture, left distal ulna metaphyseal fracture  Procedure:  1. ORIF Left comminuted intraarticular distal radius fracture, 2 intraarticular fragments 2. Closed treatment left distal ulna metaphyseal fracture without manipulation 3. Left brachioradialis release  Surgeon: [name], MD  Assistant: Daryll Brod, MD  Anesthesia: Regional with sedation  Fluids: Per anesthesia flow sheet  EBL: minimal  Complications: None  Specimen: None  Tourniquet Time:  Total Tourniquet Time Documented: Upper Arm (Left) - 45 minutes Total: Upper Arm (Left) - 45 minutes   Disposition: Stable to PACU  INDICATIONS:  Debra Fry is a 78 y.o. female states she fell approximately one week ago injuring her left wrist.  Seen in ED where XR revealed distal radius and ulna fractures.  We discussed nonoperative and operative treatment options.  She wished to proceed with operative fixation.  Risks, benefits, and alternatives of surgery were discussed including the risk of blood loss; infection; damage to nerves, vessels, tendons, ligaments, bone; failure of surgery; need for additional surgery; complications with wound healing; continued pain; nonunion; malunion; stiffness.  We also discussed the possible need for bone graft and the benefits and risks including the possibility of disease transmission.  She voiced understanding of these risks and elected to proceed.    OPERATIVE COURSE:  After being identified preoperatively by myself, the patient and I agreed upon the procedure and site of procedure.  Surgical site was marked.  The risks, benefits and alternatives of the surgery were reviewed and she wished to proceed.  Surgical consent had been signed.  She was given IV Ancef as  preoperative antibiotic prophylaxis.  She was transferred to the operating room and placed on the operating room table in supine position with the Left upper extremity on an armboard. Sedation was induced by the anesthesiologist.  A regional block had been performed by anesthesia in preoperative holding.  The Left upper extremity was prepped and draped in normal sterile orthopedic fashion.  A surgical pause was performed between the surgeons, anesthesia and operating room staff, and all were in agreement as to the patient, procedure and site of procedure.  Tourniquet at the proximal aspect of the extremity was inflated to 250 mmHg after exsanguination of the limb with an Esmarch bandage.  Standard volar Mallie Mussel approach was used.  The bipolar electrocautery was used to obtain hemostasis.  The superficial and deep portions of the FCR tendon sheath were incised, and the FCR and FPL were swept ulnarly to protect the palmar cutaneous branch of the median nerve.  The brachioradialis was released at the radial side of the radius.  The pronator quadratus was released and elevated with the periosteal elevator.  The fracture site was identified and cleared of soft tissue interposition and hematoma.  It was reduced under direct visualization.  There was intraarticular extension creating two intraarticular fragments.  An [employer]uMed volar distal radial locking plate was selected.  It was secured to the bone with the guidepins.  C-arm was used in AP and lateral projections to ensure appropriate reduction and position of the hardware and adjustments made as necessary.  Standard AO drilling and measuring technique was used.  A single screw was placed in the slotted hole in the shaft of the plate.  The distal holes were filled with locking pegs with the exception of the styloid holes,  which were filled with locking screws.  The remaining holes in the shaft of the plate were filled with nonlocking screws.  Good purchase was obtained.   C-arm was used in AP, lateral and oblique projections to ensure appropriate reduction and position of hardware, which was the case.  There was no intra-articular penetration of hardware.  The distal ulna fracture was in good position and did not require fixation.  The wound was copiously irrigated with sterile saline.  Pronator quadratus was repaired back over top of the plate using 4-0 Vicryl suture.  Vicryl suture was placed in the subcutaneous tissues in an inverted interrupted fashion and the skin was closed with 4-0 nylon in a horizontal mattress fashion.  There was good pronation and supination of the wrist without crepitance.  The wound was then dressed with sterile Xeroform, 4x4s, and wrapped with a Kerlix bandage.  A sugar tong splint was placed and wrapped with Kerlix and Ace bandage.  Tourniquet was deflated at 45 minutes.  Fingertips were pink with brisk capillary refill after deflation of the tourniquet.  Operative drapes were broken down.  The patient was awoken from anesthesia safely.  She was transferred back to the stretcher and taken to the PACU in stable condition.  I will see her back in the office in one week for postoperative followup.  I will give her a prescription for Norco 5/325 1-2 tabs PO q6 hours prn pain, dispense # 30.    [name], MD Electronically signed, 11/23/18   Addendum (12/08/2018): Correct the type of splint placed.

## 2018-11-23 NOTE — Anesthesia Postprocedure Evaluation (Signed)
Anesthesia Post Note  Patient: Debra Fry  Procedure(s) Performed: OPEN REDUCTION INTERNAL FIXATION (ORIF) LEFT DISTAL RADIAL AND ULNA  FRACTURES (Left Wrist)     Patient location during evaluation: PACU Anesthesia Type: Regional Level of consciousness: awake and alert Pain management: pain level controlled Vital Signs Assessment: post-procedure vital signs reviewed and stable Respiratory status: spontaneous breathing, nonlabored ventilation and respiratory function stable Cardiovascular status: stable and blood pressure returned to baseline Anesthetic complications: no    Last Vitals:  Vitals:   11/23/18 1330 11/23/18 1350  BP: (!) 126/56 (!) 139/59  Pulse: 70 68  Resp: 19 16  Temp:  36.6 C  SpO2: 96% 96%    Last Pain:  Vitals:   11/23/18 1350  TempSrc: Oral  PainSc: 0-No pain                 Audry Pili

## 2018-11-23 NOTE — Progress Notes (Signed)
Assisted Dr. Brock with left, ultrasound guided, supraclavicular block. Side rails up, monitors on throughout procedure. See vital signs in flow sheet. Tolerated Procedure well. 

## 2018-11-23 NOTE — H&P (Signed)
  Debra Fry is an 78 y.o. female.   Chief Complaint: left wrist fracture HPI: 78 yo female states she fell from standing height last week injuring left wrist.  Seen in ED where XR revealed left distal radius fracture. Splinted and followed up in the office.  She wishes to proceed with operative fixation.  Allergies: No Known Allergies  Past Medical History:  Diagnosis Date  . Coronary artery disease   . GERD (gastroesophageal reflux disease)   . Headache   . History of hiatal hernia   . Hyperlipidemia   . Myocardial infarction ([place]) 12/1999  . Stroke [facility])     Past Surgical History:  Procedure Laterality Date  . CARDIAC CATHETERIZATION  2001   results in [place] conversion  . CORONARY ARTERY BYPASS GRAFT  2001  . ENDARTERECTOMY Left 02/13/2018   Procedure: ENDARTERECTOMY CAROTID;  Surgeon: Elam Dutch, MD;  Location: [facility] OR;  Service: Vascular;  Laterality: Left;    Family History: Family History  Family history unknown: Yes    Social History:   reports that she has never smoked. She has never used smokeless tobacco. She reports that she does not drink alcohol or use drugs.  Medications: No medications prior to admission.    No results found for this or any previous visit (from the past 48 hour(s)).  No results found.   A comprehensive review of systems was negative.  Height 5\' 2"  (1.575 m), weight 59.9 kg.  General appearance: alert, cooperative and appears stated age Head: Normocephalic, without obvious abnormality, atraumatic Neck: supple, symmetrical, trachea midline Cardio: regular rate and rhythm Resp: clear to auscultation bilaterally Extremities: Intact sensation and capillary refill all digits.  +epl/fpl/io.  No wounds.  Pulses: 2+ and symmetric Skin: Skin color, texture, turgor normal. No rashes or lesions Neurologic: Grossly normal Incision/Wound: none  Assessment/Plan Left distal radius fracture.  Non operative and operative treatment  options have been discussed with the patient and patient wishes to proceed with operative treatment. Risks, benefits, and alternatives of surgery have been discussed and the patient agrees with the plan of care.   Debra Fry 11/23/2018, 8:37 AM

## 2018-11-23 NOTE — Anesthesia Procedure Notes (Signed)
Anesthesia Regional Block: Supraclavicular block   Pre-Anesthetic Checklist: ,, timeout performed, Correct Patient, Correct Site, Correct Laterality, Correct Procedure, Correct Position, site marked, Risks and benefits discussed,  Surgical consent,  Pre-op evaluation,  At surgeon's request and post-op pain management  Laterality: Left  Prep: chloraprep       Needles:  Injection technique: Single-shot  Needle Type: Echogenic Needle     Needle Length: 5cm  Needle Gauge: 21     Additional Needles:   Narrative:  Start time: 11/23/2018 11:30 AM End time: 11/23/2018 11:35 AM Injection made incrementally with aspirations every 5 mL.  Performed by: Personally  Anesthesiologist: Audry Pili, MD  Additional Notes: No pain on injection. No increased resistance to injection. Injection made in 5cc increments. Good needle visualization. Patient tolerated the procedure well.

## 2018-11-23 NOTE — Anesthesia Preprocedure Evaluation (Addendum)
Anesthesia Evaluation  Patient identified by MRN, date of birth, ID band Patient awake    Reviewed: Allergy & Precautions, NPO status , Patient's Chart, lab work & pertinent test results  History of Anesthesia Complications Negative for: history of anesthetic complications  Airway Mallampati: II  TM Distance: >3 FB Neck ROM: Full    Dental  (+) Dental Advisory Given, Partial Upper, Partial Lower   Pulmonary neg pulmonary ROS,    Pulmonary exam normal        Cardiovascular (-) angina+ CAD, + Past MI (2001) and + CABG (2001)  Normal cardiovascular exam   '20 Carotid US - 1-39% right ICAS, 60-79% left ICAS  '20 TTE - mild LVH. EF 55% to 60%. Mild MR and PR.    Neuro/Psych  Headaches, CVA, No Residual Symptoms negative psych ROS   GI/Hepatic Neg liver ROS, hiatal hernia, GERD  Controlled,  Endo/Other  negative endocrine ROS  Renal/GU negative Renal ROS     Musculoskeletal negative musculoskeletal ROS (+)   Abdominal   Peds  Hematology negative hematology ROS (+)   Anesthesia Other Findings   Reproductive/Obstetrics                            Anesthesia Physical Anesthesia Plan  ASA: III  Anesthesia Plan: Regional   Post-op Pain Management:  Regional for Post-op pain   Induction: Intravenous  PONV Risk Score and Plan: 2 and Treatment may vary due to age or medical condition and Propofol infusion  Airway Management Planned: Simple Face Mask and Natural Airway  Additional Equipment: None  Intra-op Plan:   Post-operative Plan:   Informed Consent: I have reviewed the patients History and Physical, chart, labs and discussed the procedure including the risks, benefits and alternatives for the proposed anesthesia with the patient or authorized representative who has indicated his/her understanding and acceptance.       Plan Discussed with: CRNA and Anesthesiologist  Anesthesia  Plan Comments: (LMA as backup)       Anesthesia Quick Evaluation

## 2018-11-27 ENCOUNTER — Encounter (HOSPITAL_BASED_OUTPATIENT_CLINIC_OR_DEPARTMENT_OTHER): Payer: Self-pay | Admitting: Orthopedic Surgery

## 2018-12-27 ENCOUNTER — Other Ambulatory Visit: Payer: Self-pay

## 2018-12-27 ENCOUNTER — Ambulatory Visit (INDEPENDENT_AMBULATORY_CARE_PROVIDER_SITE_OTHER): Payer: Medicare HMO | Admitting: Family Medicine

## 2018-12-27 ENCOUNTER — Encounter: Payer: Self-pay | Admitting: Family Medicine

## 2018-12-27 VITALS — BP 135/66 | HR 70

## 2018-12-27 DIAGNOSIS — I63412 Cerebral infarction due to embolism of left middle cerebral artery: Secondary | ICD-10-CM

## 2018-12-27 DIAGNOSIS — R200 Anesthesia of skin: Secondary | ICD-10-CM | POA: Diagnosis not present

## 2018-12-27 DIAGNOSIS — R739 Hyperglycemia, unspecified: Secondary | ICD-10-CM

## 2018-12-27 DIAGNOSIS — R202 Paresthesia of skin: Secondary | ICD-10-CM

## 2018-12-27 DIAGNOSIS — E7849 Other hyperlipidemia: Secondary | ICD-10-CM | POA: Diagnosis not present

## 2018-12-27 DIAGNOSIS — B0229 Other postherpetic nervous system involvement: Secondary | ICD-10-CM

## 2018-12-27 MED ORDER — ROSUVASTATIN CALCIUM 5 MG PO TABS: 5.0000 mg | ORAL_TABLET | Freq: Every day | ORAL | 1 refills | Status: DC

## 2018-12-27 MED ORDER — LIDOCAINE 5 % EX PTCH: 1.0000 | MEDICATED_PATCH | Freq: Every evening | CUTANEOUS | 3 refills | Status: DC

## 2018-12-27 NOTE — Progress Notes (Addendum)
Office Visit Note   Patient: Debra Fry           Date of Birth: 1940-07-31           MRN: [id number] Visit Date: 12/27/2018 Requested by: No referring provider defined for this encounter. PCP: Patient, No Pcp Per  Subjective: Chief Complaint  Patient presents with  . 3 months follow up    HPI: She is here for monitoring of hyperlipidemia.  Overall doing well, but she was having some leg pain and decided to stop Lipitor for a while.  Her leg pain disappeared completely.  She was afraid to be off cholesterol medicine due to her history of stroke, so she resumed Lipitor and her leg pain has come back.  She is wondering if there is something else she could try or whether she could decrease her dosage.  She also complains of numbness in her right hand that wakes her from sleep.  In addition, she had shingles in her left thoracic area 2 years ago and still has nightly burning pain.  Gabapentin did not help.  She is trying to eat more healthfully.              ROS: No fevers or chills.  All other systems were reviewed and are negative.  Objective: Vital Signs: BP 135/66   Pulse 70   Physical Exam:  General:  Alert and oriented, in no acute distress. Pulm:  Breathing unlabored. Psy:  Normal mood, congruent affect. Skin: No visible rash. HEENT: Ear canals are clean today.  No carotid bruits or thyromegaly. CV: Regular rate and rhythm without murmurs, rubs, or gallops.  No peripheral edema.  2+ radial and posterior tibial pulses. Lungs: Clear to auscultation throughout with no wheezing or areas of consolidation. Right wrist: No atrophy, positive Tinel's at the carpal tunnel and positive Phalen's test.    Imaging: None today  Assessment & Plan: 1.  Hyperlipidemia with side effects to Lipitor -We will try Crestor 5 mg daily.  Recheck lipid panel in about 4 months but we will check it today as a baseline. -Will increase dosage if needed and tolerated.  2.  Hyperglycemia -We  will check A1c with labs today.  3.  Doing well status post stroke  4.  Right carpal tunnel syndrome -Night splint for 6 weeks.  Nerve studies or injection if symptoms do not improve.  5.  Postherpetic neuralgia -Trial of Lidoderm patches.      Procedures: No procedures performed  No notes on file     PMFS History: Patient Active Problem List   Diagnosis Date Noted  . Hyperglycemia 09/27/2018  . 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 Acute CVA (cerebrovascular accident) ([place]) 01/27/2018   Past Medical History:  Diagnosis Date  . Coronary artery disease   . GERD (gastroesophageal reflux disease)   . Headache   . History of hiatal hernia   . Hyperlipidemia   . Myocardial infarction ([place]) 12/1999  . Stroke [facility])     Family History  Family history unknown: Yes    Past Surgical History:  Procedure Laterality Date  . CARDIAC CATHETERIZATION  2001   results in [place] conversion  . CORONARY ARTERY BYPASS GRAFT  2001  . ENDARTERECTOMY Left 02/13/2018   Procedure: ENDARTERECTOMY CAROTID;  Surgeon: Elam Dutch, MD;  Location: [place];  Service: Vascular;  Laterality: Left;  . OPEN REDUCTION INTERNAL FIXATION (ORIF) DISTAL  RADIAL FRACTURE Left 11/23/2018   Procedure: OPEN REDUCTION INTERNAL FIXATION (ORIF) LEFT DISTAL RADIAL AND ULNA  FRACTURES;  Surgeon: [name], MD;  Location: [place];  Service: Orthopedics;  Laterality: Left;   Social History   Occupational History  . Not on file  Tobacco Use  . Smoking status: Never Smoker  . Smokeless tobacco: Never Used  Substance and Sexual Activity  . Alcohol use: No  . Drug use: No  . Sexual activity: Not on file

## 2018-12-27 NOTE — Addendum Note (Signed)
Addended by: Hortencia Pilar on: 12/27/2018 09:29 AM   Modules accepted: Orders

## 2019-01-03 ENCOUNTER — Telehealth: Payer: Self-pay | Admitting: Family Medicine

## 2019-01-03 NOTE — Telephone Encounter (Signed)
   Labs are notable for the following:  Total cholesterol 128 HDL 44 Triglycerides 68 LDL 70  Based on these lipid numbers, a very low dose of crestor should be plenty for her.  Recheck in 3-4 months.  Glucose elevated at 117 Remainder of metabolic panel looks good.  Hemoglobin A1c elevated at 6.4.  This is almost in diabetes range.  Very important to limit intake of breads; pastas; cereals; sugars/sweets.  Recheck in 4-6 months.  Complete blood count looks normal.

## 2019-01-03 NOTE — Telephone Encounter (Signed)
I called and advised the patient of the lab results and instructions on limiting intake of concentrated sweets. She will try doing this. I advised her we will most likely recheck her blood work at her April 2021 appointment (will fast after 10 am, since her appointment is at 4:20).

## 2019-01-22 ENCOUNTER — Telehealth: Payer: Self-pay | Admitting: Neurology

## 2019-01-22 NOTE — Telephone Encounter (Signed)
Patient was calling you back. Thanks!

## 2019-01-22 NOTE — Telephone Encounter (Signed)
Patient called and said her face has been hurting for about a week. She'd like some advice. Patient last seen 12/09/16.  [facility] on [address]

## 2019-01-22 NOTE — Telephone Encounter (Signed)
No answer, need more details regarding facial pains

## 2019-01-22 NOTE — Telephone Encounter (Signed)
Patient having a lot of issues, she is going to call pharmacy and see what she taken.

## 2019-01-25 NOTE — Telephone Encounter (Signed)
Patient has not been seen by Dr. Tomi Likens since 2018, and she has been seeing [facility] for the past year, recommend she contact them for now, then schedule f/u with Dr. Tomi Likens if she wishes. Thanks

## 2019-01-25 NOTE — Telephone Encounter (Signed)
Patient is calling in that she is having a lot of pain in her face- she said it is tick like. She is needing something for the pain. Pharm on file is correct. Thanks!

## 2019-01-25 NOTE — Telephone Encounter (Signed)
02/06/19 she has an appointment with Tomi Likens. She states she was not aware she seen any other neurologist. I went over that she has been seeing [facility] and she stated she was not. Then after explaining further she stated that was just her "stroke doctor" they didn't treat her head. Expressed to patient that no pain medication will be sent in and if her pain is that sever she should go to the ED

## 2019-01-25 NOTE — Telephone Encounter (Signed)
Please advise on below  

## 2019-01-27 ENCOUNTER — Encounter (HOSPITAL_COMMUNITY): Payer: Self-pay

## 2019-01-27 ENCOUNTER — Ambulatory Visit (HOSPITAL_COMMUNITY)
Admission: EM | Admit: 2019-01-27 | Discharge: 2019-01-27 | Disposition: A | Discharge status: Home / Self Care | Payer: Medicare HMO | Attending: Family Medicine | Admitting: Family Medicine

## 2019-01-27 ENCOUNTER — Other Ambulatory Visit: Payer: Self-pay

## 2019-01-27 ENCOUNTER — Other Ambulatory Visit: Payer: Self-pay | Admitting: Family Medicine

## 2019-01-27 DIAGNOSIS — R519 Headache, unspecified: Secondary | ICD-10-CM

## 2019-01-27 DIAGNOSIS — I10 Essential (primary) hypertension: Secondary | ICD-10-CM

## 2019-01-27 MED ORDER — MELOXICAM 15 MG PO TABS: 15.0000 mg | ORAL_TABLET | Freq: Every day | ORAL | 0 refills | Status: DC

## 2019-01-27 NOTE — ED Provider Notes (Signed)
[facility]    CSN: [id number] Arrival date & time: 01/27/19  1745      History   Chief Complaint Chief Complaint  Patient presents with  . Headache    HPI Debra Fry is a 79 y.o. female.   HPI  Presents with a complaint of one week of intermittent right sided headaches. In review of EMR patient is followed by Dr. Tomi Likens at [facility] neurolog[facility] and was last treated for trigeminal neuralgia in 2018 and prescribed Gabapentin. She has been free of trigeminal neuralgia symptoms since 2018 up until 1 week ago. She is requesting to a prescription for  carbamazepine due a friend told her it works well for his symptoms. Patient is hypertensive on arrival. BP is normally normotensive. She has a history of CVA and carotid stenosis. She is asymptomatic at present of unilateral headache pain and is scheduled to follow-up with Dr. Tomi Likens 02/06/19. She resumed taking Gabapentin which was previously prescribed however, medication causes severe drowsiness and patient reports that she doesn't fell medication is working. She has not taken any tylenol or ibuprofen for the pain when it occurs. She denies dizziness, new weakness, visual changes, numbness or speech impairment when these episodes precipitate. She is a poor historian and has difficulty explaining the character and quality of pain when it occurs. She reports the pain occurs suddenly and resolves abruptly. Right sides headache pain is severe while occurring.   Past Medical History:  Diagnosis Date  . Coronary artery disease   . GERD (gastroesophageal reflux disease)   . Headache   . History of hiatal hernia   . Hyperlipidemia   . Myocardial infarction ([place]) 12/1999  . Stroke [facility])     Patient Active Problem List   Diagnosis Date Noted  . Hyperglycemia 09/27/2018  . 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 Acute CVA (cerebrovascular accident) ([place]) 01/27/2018     Past Surgical History:  Procedure Laterality Date  . CARDIAC CATHETERIZATION  2001   results in [place] conversion  . CORONARY ARTERY BYPASS GRAFT  2001  . ENDARTERECTOMY Left 02/13/2018   Procedure: ENDARTERECTOMY CAROTID;  Surgeon: Elam Dutch, MD;  Location: [place];  Service: Vascular;  Laterality: Left;  . OPEN REDUCTION INTERNAL FIXATION (ORIF) DISTAL RADIAL FRACTURE Left 11/23/2018   Procedure: OPEN REDUCTION INTERNAL FIXATION (ORIF) LEFT DISTAL RADIAL AND ULNA  FRACTURES;  Surgeon: [name], MD;  Location: [place];  Service: Orthopedics;  Laterality: Left;    OB History   No obstetric history on file.      Home Medications    Prior to Admission medications   Medication Sig Start Date End Date Taking? Authorizing Provider  aspirin 81 MG chewable tablet Chew 1 tablet (81 mg total) by mouth daily. Patient not taking: Reported on 12/27/2018 01/30/18   Raiford Noble Latif, DO  ibuprofen (ADVIL) 200 MG tablet Take 200 mg by mouth every 6 (six) hours as needed.    [provider]  lidocaine (LIDODERM) 5 % Place 1 patch onto the skin every evening. Remove & Discard patch within 12 hours or as directed by MD 12/27/18   Hilts, Legrand Como, MD  rosuvastatin (CRESTOR) 5 MG tablet Take 1 tablet (5 mg total) by mouth daily. 12/27/18   Hilts, Legrand Como, MD  vitamin C (ASCORBIC ACID) 500 MG tablet Take 500 mg by mouth daily.    [provider]  ezetimibe (ZETIA) 10 MG  tablet Take 1 tablet (10 mg total) by mouth daily. 09/25/18 11/14/18  Frann Rider, NP    Family History Family History  Family history unknown: Yes    Social History Social History   Tobacco Use  . Smoking status: Never Smoker  . Smokeless tobacco: Never Used  Substance Use Topics  . Alcohol use: No  . Drug use: No     Allergies   Patient has no known allergies.   Review of Systems Review of Systems   Physical Exam Triage Vital Signs ED Triage Vitals  Enc Vitals  Group     BP 01/27/19 1811 (!) 189/76, Repeat 157/88 (provider)     Pulse Rate 01/27/19 1811 80     Resp 01/27/19 1811 16     Temp 01/27/19 1811 98.1 F (36.7 C)     Temp Source 01/27/19 1811 Oral     SpO2 01/27/19 1811 100 %     Weight --      Height --      Head Circumference --      Peak Flow --      Pain Score 01/27/19 1809 10     Pain Loc --      Pain Edu? --      Excl. in [place]? --    No data found.  Updated Vital Signs BP (!) 189/76 (BP Location: Left Arm)   Pulse 80   Temp 98.1 F (36.7 C) (Oral)   Resp 16   SpO2 100%  Repeat BP 157/88 Right ARM  Visual Acuity Right Eye Distance:   Left Eye Distance:   Bilateral Distance:    Right Eye Near:   Left Eye Near:    Bilateral Near:     Physical Exam Constitutional:      Appearance: She is not ill-appearing or toxic-appearing.  HENT:     Head: Normocephalic.  Eyes:     Extraocular Movements: Extraocular movements intact.     Pupils: Pupils are equal, round, and reactive to light.  Cardiovascular:     Rate and Rhythm: Normal rate and regular rhythm.  Pulmonary:     Effort: Pulmonary effort is normal.     Breath sounds: Normal breath sounds.  Abdominal:     Palpations: Abdomen is soft.  Musculoskeletal:     Cervical back: No rigidity.  Lymphadenopathy:     Cervical: No cervical adenopathy.  Skin:    General: Skin is warm.  Neurological:     Mental Status: She is alert and oriented to person, place, and time. Mental status is at baseline.  Psychiatric:        Mood and Affect: Mood normal.        Behavior: Behavior normal.     UC Treatments / Results  Labs (all labs ordered are listed, but only abnormal results are displayed) Labs Reviewed - No data to display  EKG   Radiology No results found.  Procedures Procedures (including critical care time)  Medications Ordered in UC Medications - No data to display  Initial Impression / Assessment and Plan / UC Course  I have reviewed the triage  vital signs and the nursing notes.  Pertinent labs & imaging results that were available during my care of the patient were reviewed by me and considered in my medical decision making (see chart for details).   Explained in great detail to patient the importance of evaluation in the ER if unilateral headaches continue. Discussed the importance of calling her PCP on  the next business day as her blood pressure is elevated x 2 readings taken >20 minutes apart and she is pain free at present. Concern that the headache pain and elevated BP could be related to patients carotid stenosis. Patient was to follow-up with a vascular surgeon per neurology notes in November, however, she reports today being unaware of a vascular follow-up. Expressed the importance of follow-up with PCP for additional work-up of intermittent unilateral headaches elevated blood pressure . Advised PCP can assist her with obtaining an appointment with vascular provider. Given the Gabapentin is not helping alleviate symptoms and is causing drowsiness, discontinue use. I have prescribed Meloxicam 15 mg once daily as needed for HA pain. Advised ok to take tylenol with Meloxicam, however do not take ibuprofen or naproxen while taking this medication.  Final Clinical Impressions(s) / UC Diagnoses   Final diagnoses:  Left-sided headache  Hypertension, unspecified type     Discharge Instructions     Your blood pressure is elevated today, please follow-up with Dr. Junius Roads regarding this and if your left sided headache pain worsen before your follow-up with neurologist, please follow-up at the emergency department. You also need to follow-up with Dr. Junius Roads regarding vascular doctor.     ED Prescriptions    Medication Sig Dispense Auth. Provider   meloxicam (MOBIC) 15 MG tablet Take 1 tablet (15 mg total) by mouth daily. 30 tablet Scot Jun, FNP     PDMP not reviewed this encounter.   Scot Jun, FNP 01/27/19 1940

## 2019-01-27 NOTE — Discharge Instructions (Addendum)
Your blood pressure is elevated today, please follow-up with Dr. Junius Roads regarding this and if your left sided headache pain worsen before your follow-up with neurologist, please follow-up at the emergency department. You also need to follow-up with Dr. Junius Roads regarding vascular doctor.

## 2019-01-27 NOTE — ED Triage Notes (Addendum)
Patient presents to Urgent Care with complaints of headache since a week ago. Patient reports she has been taking gabapentin for her headaches but it is not working.  Per friend (on the phone) the pt has trigeminal neuralgia and so does he, carbamezapine works for him and the pt was hoping she could be prescribed the same.

## 2019-01-28 NOTE — Telephone Encounter (Signed)
Requested medication (s) are due for refill today: yes  Requested medication (s) are on the active medication list: yes  Last refill:  01/28/2019  Future visit scheduled: no  Notes to clinic:  Patient Is requesting a 90 day supply   Requested Prescriptions  Pending Prescriptions Disp Refills   meloxicam (MOBIC) 15 MG tablet [Pharmacy Med Name: MELOXICAM 15MG  TABLETS] 90 tablet     Sig: TAKE 1 TABLET(15 MG) BY MOUTH DAILY      There is no refill protocol information for this order

## 2019-02-06 ENCOUNTER — Telehealth: Payer: Medicare HMO | Admitting: Neurology

## 2019-03-28 ENCOUNTER — Ambulatory Visit (INDEPENDENT_AMBULATORY_CARE_PROVIDER_SITE_OTHER): Payer: Medicare HMO | Admitting: Adult Health

## 2019-03-28 ENCOUNTER — Encounter: Payer: Self-pay | Admitting: Adult Health

## 2019-03-28 VITALS — BP 146/72 | HR 62 | Temp 96.8°F | Ht 62.0 in | Wt 137.8 lb

## 2019-03-28 DIAGNOSIS — E785 Hyperlipidemia, unspecified: Secondary | ICD-10-CM

## 2019-03-28 DIAGNOSIS — Z9889 Other specified postprocedural states: Secondary | ICD-10-CM

## 2019-03-28 DIAGNOSIS — I10 Essential (primary) hypertension: Secondary | ICD-10-CM | POA: Diagnosis not present

## 2019-03-28 DIAGNOSIS — B0223 Postherpetic polyneuropathy: Secondary | ICD-10-CM

## 2019-03-28 DIAGNOSIS — I63412 Cerebral infarction due to embolism of left middle cerebral artery: Secondary | ICD-10-CM

## 2019-03-28 DIAGNOSIS — G500 Trigeminal neuralgia: Secondary | ICD-10-CM

## 2019-03-28 MED ORDER — AMITRIPTYLINE HCL 10 MG PO TABS: 10.0000 mg | ORAL_TABLET | Freq: Every day | ORAL | 3 refills | Status: DC

## 2019-03-28 NOTE — Progress Notes (Signed)
I agree with the above plan 

## 2019-03-28 NOTE — Progress Notes (Signed)
[facility] [address] [place]. [place] [address] [phone]       OFFICE FOLLOW-UP NOTE  [name] Date of Birth:  24-Jan-1941 Medical Record Number:  [id number]   PCP:  [facility] provider: Dr. Leonie Man Reason for visit: Stroke follow-up   Chief complaint Chief Complaint  Patient presents with  . Follow-up    Rm 9, alone  . Cerebrovascular Accident    doing ok.  Has issues with trigeminal neuralgia R face (has see Dr. Tomi Likens in past),  Would like to see someone about this.      HPI:   Debra Fry is a 79 year old female who is being seen today, 03/28/2019, for stroke follow-up.  She has been stable from a stroke standpoint since prior visit without any new or reoccurring stroke/TIA symptoms.  Continues on aspirin 81 mg daily without bleeding or bruising.  Recently initiated Crestor by PCP as difficulty tolerating atorvastatin due to statin myalgias.  Tolerating Crestor well without myalgias.  After prior visit, recommended initiating Zetia but per patient, 5 minutes after taking medication she experienced right hand contractures and after ingestion of mustard, contractures resolved but reports residual stiffness middle finger distal interphalangeal joint.  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  Blood pressure today 146/92.  She was advised to follow-up with repeat carotid ultrasound around 11/2018 but does not appear as though this occurred for unknown reasons.  No further stroke related concerns at this time.  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  Previously being treated by Dr. Tomi Likens at [facility] neurology.  She recently had a flareup of trigeminal neuralgia and attempted to be seen by Dr. Tomi Likens but apparently was not able to be seen as she is currently being seen by our office.  They  requested our office take over trigeminal neuralgia and postherpetic neuralgia management.  Currently using gabapentin 600 mg 3 times daily as needed during flareups with benefit.  Continues to experience daily postherpetic neuralgia pain located left T9-T10 without benefit of gabapentin.  Experiences burning sensation daily and worsened at night.  Has trialed oxcarbazepine and baclofen previously with reported difficulty tolerating.        History copied for reference purposes only Initial visit 03/23/2018 PS: Debra Fry is a 79 year old Caucasian lady seen today for initial office follow-up visit following hospital admission for stroke in January 2020.  History is obtained from the patient and review of electronic medical records.  I have personally reviewed imaging films.  Debra Fry is a pleasant 79 year old Caucasian lady with history of coronary artery disease and hyperlipidemia who presented with sudden onset of right upper extremity numbness.  She was not a TPA candidate due to mild symptoms.  CT scan of the head showed no acute abnormalities.  CT angiogram showed acute infarction in the left frontoparietal junction with 75% stenosis at the left carotid bifurcation and 70% right vertebral origin stenosis.  Carotid ultrasound confirmed 65 to 80% calcific plaque in the left carotid bifurcation MRI scan of the brain confirmed small acute left perisylvian infarcts.  MRI scan cervical spine showed motion degradation but no significant spinal stenosis.  Transthoracic echo showed normal ejection fraction without cardiac source of embolism.  LDL cholesterol was elevated at 169 mg percent and hemoglobin A1c was 5.8.  Patient was found to have no therapy needs and did  well and recovered completely.  She was discharged home after consultation with vascular surgery will arrange for elective left carotid endarterectomy on 02/13/2018 which went uneventfully.  Patient states she is done well without recurrent stroke or  TIA symptoms.  She has had follow-up with Dr. Oneida Alar on 03/09/2018 and he seemed pleased with the results.  She is on aspirin for stroke prevention and tolerating it well without bleeding or bruising.  She is tolerating Lipitor 80 mg well but does complain of knee and joint pains.  She has not had any follow-up lipid profile checked.  Update 09/21/2018: Debra Fry is a 79 year old female who is being seen today for 60-month stroke follow-up accompanied by her grandson.  She has been stable from a stroke standpoint without new or reoccurring symptoms or residual deficit from prior stroke.  Continues on aspirin without bleeding or bruising.  Lipid panel obtained at prior visit with LDL 102.  It was recommended to initiate Repatha but per patient, she attempted to pick up at her pharmacy and she was told that her insurance did not approve despite PA approval.  She has continued on atorvastatin 80 mg daily at this time and does complain of lower extremity cramping/pain typically worsened by increased exertion.  She has not had any repeat lipid levels at this time as she has not establish care with PCP.  Blood pressure today satisfactory at 126/73.  She does endorse contacting Dr. Eunice Blase office who patient reports being accepted as a patient but insurance requesting referral to be placed.  Denies new or worsening stroke/TIA symptoms.    ROS:   14 system review of systems is positive for neuropathic pain, joint pain and all systems negative  PMH:  Past Medical History:  Diagnosis Date  . Coronary artery disease   . GERD (gastroesophageal reflux disease)   . Headache   . History of hiatal hernia   . Hyperlipidemia   . Myocardial infarction ([place]) 12/1999  . Stroke [facility])     Social History:  Social History   Socioeconomic History  . Marital status: Married    Spouse name: Not on file  . Number of children: Not on file  . Years of education: Not on file  . Highest education level: Not on file    Occupational History  . Not on file  Tobacco Use  . Smoking status: Never Smoker  . Smokeless tobacco: Never Used  Substance and Sexual Activity  . Alcohol use: No  . Drug use: No  . Sexual activity: Not on file  Other Topics Concern  . Not on file  Social History Narrative  . Not on file   Social Determinants of Health   Financial Resource Strain:   . Difficulty of Paying Living Expenses: Not on file  Food Insecurity:   . Worried About Charity fundraiser in the Last Year: Not on file  . Ran Out of Food in the Last Year: Not on file  Transportation Needs:   . Lack of Transportation (Medical): Not on file  . Lack of Transportation (Non-Medical): Not on file  Physical Activity:   . Days of Exercise per Week: Not on file  . Minutes of Exercise per Session: Not on file  Stress:   . Feeling of Stress : Not on file  Social Connections:   . Frequency of Communication with Friends and Family: Not on file  . Frequency of Social Gatherings with Friends and Family: Not on file  . Attends  Religious Services: Not on file  . Active Member of Clubs or Organizations: Not on file  . Attends Archivist Meetings: Not on file  . Marital Status: Not on file  Intimate Partner Violence:   . Fear of Current or Ex-Partner: Not on file  . Emotionally Abused: Not on file  . Physically Abused: Not on file  . Sexually Abused: Not on file    Medications:   Current Outpatient Medications on File Prior to Visit  Medication Sig Dispense Refill  . gabapentin (NEURONTIN) 600 MG tablet Take 600 mg by mouth 3 (three) times daily as needed. For trigeminal neuralgia    . rosuvastatin (CRESTOR) 5 MG tablet Take 1 tablet (5 mg total) by mouth daily. 90 tablet 1  . vitamin C (ASCORBIC ACID) 500 MG tablet Take 500 mg by mouth daily.    Marland Kitchen aspirin 81 MG chewable tablet Chew 1 tablet (81 mg total) by mouth daily. (Patient not taking: Reported on 12/27/2018) 30 tablet 0  . [DISCONTINUED] ezetimibe  (ZETIA) 10 MG tablet Take 1 tablet (10 mg total) by mouth daily. 30 tablet 2   No current facility-administered medications on file prior to visit.    Allergies:   Allergies  Allergen Reactions  . Atorvastatin     Knee pain     Today's Vitals   03/28/19 0952  BP: (!) 146/72  Pulse: 62  Temp: (!) 96.8 F (36 C)  Weight: 137 lb 12.8 oz (62.5 kg)  Height: 5\' 2"  (1.575 m)   Body mass index is 25.2 kg/m.  Physical Exam General: well developed, well nourished pleasant elderly Caucasian lady, seated, in no evident distress Head: head normocephalic and atraumatic.  Neck: supple with no carotid or supraclavicular bruits.  Cardiovascular: regular rate and rhythm, no murmurs Musculoskeletal: Bilateral hand arthritic nodules; limited left hand ROM due to recent fracture requiring surgical procedure Skin:  no rash/petichiae  Neurologic Exam Mental Status: Awake and fully alert.  Normal speech and language.  Oriented to place and time. Recent and remote memory intact. Attention span, concentration and fund of knowledge appropriate. Mood and affect appropriate.  Cranial Nerves: Pupils equal, briskly reactive to light. Extraocular movements full without nystagmus. Visual fields full to confrontation. Hearing intact. Facial sensation intact. Face, tongue, palate moves normally and symmetrically.  Motor: Normal bulk and tone. Normal strength in all tested extremity muscles. Sensory.: intact to touch ,pinprick .position and vibratory sensation.  Coordination: Rapid alternating movements normal in all extremities. Finger-to-nose and heel-to-shin performed accurately bilaterally. Gait and Station: Arises from chair without difficulty. Stance is normal. Gait demonstrates normal stride length and balance . Able to heel, toe and tandem walk without difficulty.  Reflexes: 1+ and symmetric. Toes downgoing.      ASSESSMENT: 18 year patient with left MCA branch infarct in January 2020 due to  symptomatic proximal left carotid stenosis.  Status post interval elective left carotid endarterectomy on 02/13/2018 who is doing clinically quite well with full recovery.  Vascular risk factors of hyperlipidemia and carotid stenosis only.  Stable from stroke standpoint.  History of right-sided trigeminal neuralgia and left postherpetic neuralgia 2/2 hx of shingles approximately 2 years ago.  Previously being seen by Dr. Tomi Likens who requested our office take over management.  Also has concerns regarding right hand contracture which she believes secondary to Zetia     PLAN: 1. Left MCA infarct: Continue aspirin 81 mg daily  and atorvastatin for secondary stroke prevention. Maintain strict control of hypertension with  blood pressure goal below 130/90, diabetes with hemoglobin A1c goal below 6.5% and cholesterol with LDL cholesterol (bad cholesterol) goal below 70 mg/dL.  I also advised the patient to eat a healthy diet with plenty of whole grains, cereals, fruits and vegetables, exercise regularly with at least 30 minutes of continuous activity daily and maintain ideal body weight. 2. HTN: Advised to continue current treatment regimen. Advised to continue to monitor at home along with continued follow-up with PCP for management 3. HLD: Continuation of current statin use and ongoing follow-up and management by PCP 4. Carotid stenosis s/p endarterectomy: Needs to schedule follow-up with vascular surgery Dr. Oneida Alar for repeat imaging and ongoing monitoring 5. Trigeminal neuralgia, right: Recent flareup with cessation after use of gabapentin.  Advised to continue to use gabapentin as needed for flareups.  If pain becomes constant, may have to consider use of daily medication. 6. Postherpetic neuralgia: 2/2 shingles approximately 2 years ago.  Located left T9-10 distribution.  Daily pain worsening at night.  Recommend trial of amitriptyline 10 mg nightly for hopeful benefit.  Discussion regarding possible side  effects with patient verbalized understanding and additional information provided in AVS.  Advised to call office within 1 week for possible need of increase or to call office with any possible side effects. 7. Right hand concerns: Unlikely side effect from Zetia and appears to be more related to arthritis.  Advised to continue to follow with PCP as this does not appear to be neurologically related 8. Carpal tunnel syndrome, right: Symptoms consistent with carpal tunnel syndrome and recommend continued use of brace at night as well as during the day with aggravating activities.  She is not interested in pursuing any type of surgical procedure therefore additional testing not indicated at this time    Follow-up in 3 months or call earlier if needed   Greater than 50% of time during this 35 minute visit was spent on counseling,explanation of diagnosis of stroke, discussion regarding importance of stroke risk factor management, discussion regarding trigeminal neuralgia and postherpetic neuralgia, planning of further management, discussion with patient answering all questions to satisfaction  Frann Rider, AGNP-BC  [facility] [address] [place], [place] [address]  Phone [phone] Fax [phone] Note: This document was prepared with digital dictation and possible smart phrase technology. Any transcriptional errors that result from this process are unintentional.

## 2019-03-28 NOTE — Patient Instructions (Addendum)
Your Plan:  Start amitriptyline 10mg  nightly for shingle pain - continue for 1 week and please call office if dosage is needed to be increased or if have difficulty tolerating   Continue gabapentin as needed for trigeminal neuralgia   Continue aspirin 81mg  and crestor for secondary stroke prevention   Follow up in 3 months or call earlier if needed       Thank you for coming to see Debra Fry at [facility]. I hope we have been able to provide you high quality care today.  You may receive a patient satisfaction survey over the next few weeks. We would appreciate your feedback and comments so that we may continue to improve ourselves and the health of our patients.    Amitriptyline tablets What is this medicine? AMITRIPTYLINE (a mee TRIP ti leen) is used to treat depression. This medicine may be used for other purposes; ask your health care provider or pharmacist if you have questions. COMMON BRAND NAME(S): Elavil, Vanatrip What should I tell my health care provider before I take this medicine? They need to know if you have any of these conditions:  an alcohol problem  asthma, difficulty breathing  bipolar disorder or schizophrenia  difficulty passing urine, prostate trouble  glaucoma  heart disease or previous heart attack  liver disease  over active thyroid  seizures  thoughts or plans of suicide, a previous suicide attempt, or family history of suicide attempt  an unusual or allergic reaction to amitriptyline, other medicines, foods, dyes, or preservatives  pregnant or trying to get pregnant  breast-feeding How should I use this medicine? Take this medicine by mouth with a drink of water. Follow the directions on the prescription label. You can take the tablets with or without food. Take your medicine at regular intervals. Do not take it more often than directed. Do not stop taking this medicine suddenly except upon the advice of your doctor.  Stopping this medicine too quickly may cause serious side effects or your condition may worsen. A special MedGuide will be given to you by the pharmacist with each prescription and refill. Be sure to read this information carefully each time. Talk to your pediatrician regarding the use of this medicine in children. Special care may be needed. Overdosage: If you think you have taken too much of this medicine contact a poison control center or emergency room at once. NOTE: This medicine is only for you. Do not share this medicine with others. What if I miss a dose? If you miss a dose, take it as soon as you can. If it is almost time for your next dose, take only that dose. Do not take double or extra doses. What may interact with this medicine? Do not take this medicine with any of the following medications:  arsenic trioxide  certain medicines used to regulate abnormal heartbeat or to treat other heart conditions  cisapride  droperidol  halofantrine  linezolid  MAOIs like Carbex, Eldepryl, Marplan, Nardil, and Parnate  methylene blue  other medicines for mental depression  phenothiazines like perphenazine, thioridazine and chlorpromazine  pimozide  probucol  procarbazine  sparfloxacin  St. John's Wort This medicine may also interact with the following medications:  atropine and related drugs like hyoscyamine, scopolamine, tolterodine and others  barbiturate medicines for inducing sleep or treating seizures, like phenobarbital  cimetidine  disulfiram  ethchlorvynol  thyroid hormones such as levothyroxine  ziprasidone This list may not describe all possible interactions. Give your health care provider  a list of all the medicines, herbs, non-prescription drugs, or dietary supplements you use. Also tell them if you smoke, drink alcohol, or use illegal drugs. Some items may interact with your medicine. What should I watch for while using this medicine? Tell your  doctor if your symptoms do not get better or if they get worse. Visit your doctor or health care professional for regular checks on your progress. Because it may take several weeks to see the full effects of this medicine, it is important to continue your treatment as prescribed by your doctor. Patients and their families should watch out for new or worsening thoughts of suicide or depression. Also watch out for sudden changes in feelings such as feeling anxious, agitated, panicky, irritable, hostile, aggressive, impulsive, severely restless, overly excited and hyperactive, or not being able to sleep. If this happens, especially at the beginning of treatment or after a change in dose, call your health care professional. Dennis Bast may get drowsy or dizzy. Do not drive, use machinery, or do anything that needs mental alertness until you know how this medicine affects you. Do not stand or sit up quickly, especially if you are an older patient. This reduces the risk of dizzy or fainting spells. Alcohol may interfere with the effect of this medicine. Avoid alcoholic drinks. Do not treat yourself for coughs, colds, or allergies without asking your doctor or health care professional for advice. Some ingredients can increase possible side effects. Your mouth may get dry. Chewing sugarless gum or sucking hard candy, and drinking plenty of water will help. Contact your doctor if the problem does not go away or is severe. This medicine may cause dry eyes and blurred vision. If you wear contact lenses you may feel some discomfort. Lubricating drops may help. See your eye doctor if the problem does not go away or is severe. This medicine can cause constipation. Try to have a bowel movement at least every 2 to 3 days. If you do not have a bowel movement for 3 days, call your doctor or health care professional. This medicine can make you more sensitive to the sun. Keep out of the sun. If you cannot avoid being in the sun, wear  protective clothing and use sunscreen. Do not use sun lamps or tanning beds/booths. What side effects may I notice from receiving this medicine? Side effects that you should report to your doctor or health care professional as soon as possible:  allergic reactions like skin rash, itching or hives, swelling of the face, lips, or tongue  anxious  breathing problems  changes in vision  confusion  elevated mood, decreased need for sleep, racing thoughts, impulsive behavior  eye pain  fast, irregular heartbeat  feeling faint or lightheaded, falls  feeling agitated, angry, or irritable  fever with increased sweating  hallucination, loss of contact with reality  seizures  stiff muscles  suicidal thoughts or other mood changes  tingling, pain, or numbness in the feet or hands  trouble passing urine or change in the amount of urine  trouble sleeping  unusually weak or tired  vomiting  yellowing of the eyes or skin Side effects that usually do not require medical attention (report to your doctor or health care professional if they continue or are bothersome):  change in sex drive or performance  change in appetite or weight  constipation  dizziness  dry mouth  nausea  tired  tremors  upset stomach This list may not describe all possible side effects.  Call your doctor for medical advice about side effects. You may report side effects to FDA at 1-800-FDA-1088. Where should I keep my medicine? Keep out of the reach of children. Store at room temperature between 20 and 25 degrees C (68 and 77 degrees F). Throw away any unused medicine after the expiration date. NOTE: This sheet is a summary. It may not cover all possible information. If you have questions about this medicine, talk to your doctor, pharmacist, or health care provider.  2020 Elsevier/Gold Standard (2018-01-03 13:04:32)

## 2019-04-27 ENCOUNTER — Ambulatory Visit: Payer: Medicare HMO | Admitting: Family Medicine

## 2019-04-30 ENCOUNTER — Encounter: Payer: Self-pay | Admitting: Family Medicine

## 2019-04-30 ENCOUNTER — Other Ambulatory Visit: Payer: Self-pay

## 2019-04-30 ENCOUNTER — Ambulatory Visit (INDEPENDENT_AMBULATORY_CARE_PROVIDER_SITE_OTHER): Payer: Medicare HMO | Admitting: Family Medicine

## 2019-04-30 ENCOUNTER — Other Ambulatory Visit: Payer: Self-pay | Admitting: Family Medicine

## 2019-04-30 VITALS — BP 131/66 | HR 71

## 2019-04-30 DIAGNOSIS — R5383 Other fatigue: Secondary | ICD-10-CM

## 2019-04-30 DIAGNOSIS — K219 Gastro-esophageal reflux disease without esophagitis: Secondary | ICD-10-CM

## 2019-04-30 DIAGNOSIS — R739 Hyperglycemia, unspecified: Secondary | ICD-10-CM | POA: Diagnosis not present

## 2019-04-30 DIAGNOSIS — E7849 Other hyperlipidemia: Secondary | ICD-10-CM

## 2019-04-30 MED ORDER — OMEPRAZOLE 20 MG PO CPDR: 20.0000 mg | DELAYED_RELEASE_CAPSULE | Freq: Every day | ORAL | 1 refills | Status: DC | PRN

## 2019-04-30 NOTE — Progress Notes (Signed)
Office Visit Note   Patient: Debra Fry           Date of Birth: [birth year]           MRN: [id number] Visit Date: 04/30/2019 Requested by: No referring provider defined for this encounter. PCP: Eunice Blase, MD  Subjective: Chief Complaint  Patient presents with  . 4 mos. recheck  . no energy x 2 weeks  . heartburn every night    HPI: He is here for monitoring of medical conditions.  From a hyperlipidemia standpoint she is tolerating Crestor.  Continues to do well from a stroke standpoint.  She has been fatigued for the past couple weeks.  She thinks it might be from allergies.  She took some allergy medicine this morning and seems to be feeling a little bit better.  She is been having heartburn every night for the past few weeks.  She thinks it is from eating too late at night.  Eating ice cream seems to help with her heartburn.  She has been eating way too many sweets per her report.               ROS:   All other systems were reviewed and are negative.  Objective: Vital Signs: BP 131/66   Pulse 71   Physical Exam:  General:  Alert and oriented, in no acute distress. Pulm:  Breathing unlabored. Psy:  Normal mood, congruent affect.  CV: Regular rate and rhythm without murmurs, rubs, or gallops.  No peripheral edema.  2+ radial and posterior tibial pulses. Lungs: Clear to auscultation throughout with no wheezing or areas of consolidation.    Imaging: None today  Assessment & Plan: 1.  Hyperlipidemia, tolerating Crestor -Recheck labs today.  Follow-up in about 6 months.  Refills when needed.  2.  Recent onset of fatigue -Labs to monitor for diabetes, thyroid dysfunction.  3.  GERD -Recommended avoiding food a couple hours before bedtime.     Procedures: No procedures performed  No notes on file     PMFS History: Patient Active Problem List   Diagnosis Date Noted  . Hyperglycemia 09/27/2018  . 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 Stroke due to  embolism of left middle cerebral artery ([place]) 02/13/2018  . Acute CVA (cerebrovascular accident) ([place]) 01/27/2018   Past Medical History:  Diagnosis Date  . Coronary artery disease   . GERD (gastroesophageal reflux disease)   . Headache   . History of hiatal hernia   . Hyperlipidemia   . Myocardial infarction ([place]) 12/1999  . Stroke [facility])     Family History  Family history unknown: Yes    Past Surgical History:  Procedure Laterality Date  . CARDIAC CATHETERIZATION  2001   results in [place] conversion  . CORONARY ARTERY BYPASS GRAFT  2001  . ENDARTERECTOMY Left 02/13/2018   Procedure: ENDARTERECTOMY CAROTID;  Surgeon: Elam Dutch, MD;  Location: [place];  Service: Vascular;  Laterality: Left;  . OPEN REDUCTION INTERNAL FIXATION (ORIF) DISTAL RADIAL FRACTURE Left 11/23/2018   Procedure: OPEN REDUCTION INTERNAL FIXATION (ORIF) LEFT DISTAL RADIAL AND ULNA  FRACTURES;  Surgeon: [name], MD;  Location: [place];  Service: Orthopedics;  Laterality: Left;   Social History   Occupational History  . Not on file  Tobacco Use  . Smoking status: Never Smoker  . Smokeless tobacco: Never Used  Substance and Sexual Activity  . Alcohol use: No  . Drug use: No  . Sexual activity: Not on file

## 2019-05-01 ENCOUNTER — Telehealth: Payer: Self-pay | Admitting: Family Medicine

## 2019-05-01 DIAGNOSIS — E119 Type 2 diabetes mellitus without complications: Secondary | ICD-10-CM | POA: Insufficient documentation

## 2019-05-01 LAB — HEMOGLOBIN A1C
Hgb A1c MFr Bld: 7.1 % of total Hgb — ABNORMAL HIGH (ref <5.7)
Mean Plasma Glucose: 157 (calc)
eAG (mmol/L): 8.7 (calc)

## 2019-05-01 LAB — LIPID PANEL
Cholesterol: 159 mg/dL (ref <200)
HDL: 44 mg/dL — ABNORMAL LOW (ref >=50)
LDL Cholesterol (Calc): 94 mg/dL (calc)
Non-HDL Cholesterol (Calc): 115 mg/dL (calc) (ref <130)
Total CHOL/HDL Ratio: 3.6 (calc) (ref <5.0)
Triglycerides: 118 mg/dL (ref <150)

## 2019-05-01 LAB — CBC WITH DIFFERENTIAL/PLATELET
Absolute Monocytes: 490 cells/uL (ref 200–950)
Basophils Absolute: 21 cells/uL (ref 0–200)
Basophils Relative: 0.3 %
Eosinophils Absolute: 189 cells/uL (ref 15–500)
Eosinophils Relative: 2.7 %
HCT: 37.3 % (ref 35.0–45.0)
Hemoglobin: 12.4 g/dL (ref 11.7–15.5)
Lymphs Abs: 1575 cells/uL (ref 850–3900)
MCH: 29.7 pg (ref 27.0–33.0)
MCHC: 33.2 g/dL (ref 32.0–36.0)
MCV: 89.2 fL (ref 80.0–100.0)
MPV: 10.2 fL (ref 7.5–12.5)
Monocytes Relative: 7 %
Neutro Abs: 4725 cells/uL (ref 1500–7800)
Neutrophils Relative %: 67.5 %
Platelets: 222 10*3/uL (ref 140–400)
RBC: 4.18 10*6/uL (ref 3.80–5.10)
RDW: 13.3 % (ref 11.0–15.0)
Total Lymphocyte: 22.5 %
WBC: 7 10*3/uL (ref 3.8–10.8)

## 2019-05-01 LAB — COMPREHENSIVE METABOLIC PANEL
AG Ratio: 1.3 (calc) (ref 1.0–2.5)
ALT: 89 U/L — ABNORMAL HIGH (ref 6–29)
AST: 89 U/L — ABNORMAL HIGH (ref 10–35)
Albumin: 4.1 g/dL (ref 3.6–5.1)
Alkaline phosphatase (APISO): 106 U/L (ref 37–153)
BUN: 14 mg/dL (ref 7–25)
CO2: 27 mmol/L (ref 20–32)
Calcium: 9.5 mg/dL (ref 8.6–10.4)
Chloride: 102 mmol/L (ref 98–110)
Creat: 0.63 mg/dL (ref 0.60–0.93)
Globulin: 3.2 g/dL (calc) (ref 1.9–3.7)
Glucose, Bld: 101 mg/dL — ABNORMAL HIGH (ref 65–99)
Potassium: 4.1 mmol/L (ref 3.5–5.3)
Sodium: 138 mmol/L (ref 135–146)
Total Bilirubin: 0.8 mg/dL (ref 0.2–1.2)
Total Protein: 7.3 g/dL (ref 6.1–8.1)

## 2019-05-01 LAB — THYROID PANEL WITH TSH
Free Thyroxine Index: 1.7 (ref 1.4–3.8)
T3 Uptake: 26 % (ref 22–35)
T4, Total: 6.6 ug/dL (ref 5.1–11.9)
TSH: 5.63 mIU/L — ABNORMAL HIGH (ref 0.40–4.50)

## 2019-05-01 NOTE — Telephone Encounter (Signed)
I called and advised the patient of her lab results. She voiced understanding and is agreeable to seeing a nutritionist. Scheduled follow up labwork with me on 08/01/19 at 9:00ish.

## 2019-05-01 NOTE — Telephone Encounter (Signed)
Labs show:  She is now a diabetic, with A1C of 7.1.  Extremely important to start eating properly, so I've requested referral to nutritionist.  Recheck labs in 3 months.  Thyroid function is borderline.  No need for meds yet, but will check again in 3 months.  Liver enzymes are elevated.  Not sure why.  Will recheck in 3 months as well.

## 2019-05-01 NOTE — Telephone Encounter (Signed)
Left message on the patient's home voice mail to call back for lab results.

## 2019-05-28 ENCOUNTER — Encounter: Payer: Medicare HMO | Admitting: Dietician

## 2019-07-02 ENCOUNTER — Encounter: Payer: Self-pay | Admitting: Adult Health

## 2019-07-02 ENCOUNTER — Ambulatory Visit: Payer: Medicare HMO | Admitting: Adult Health

## 2019-07-02 VITALS — BP 138/64 | HR 83 | Ht 62.0 in | Wt 136.0 lb

## 2019-07-02 DIAGNOSIS — Z8673 Personal history of transient ischemic attack (TIA), and cerebral infarction without residual deficits: Secondary | ICD-10-CM

## 2019-07-02 DIAGNOSIS — Z9889 Other specified postprocedural states: Secondary | ICD-10-CM

## 2019-07-02 DIAGNOSIS — E119 Type 2 diabetes mellitus without complications: Secondary | ICD-10-CM

## 2019-07-02 DIAGNOSIS — G500 Trigeminal neuralgia: Secondary | ICD-10-CM | POA: Diagnosis not present

## 2019-07-02 DIAGNOSIS — I10 Essential (primary) hypertension: Secondary | ICD-10-CM

## 2019-07-02 DIAGNOSIS — E785 Hyperlipidemia, unspecified: Secondary | ICD-10-CM | POA: Diagnosis not present

## 2019-07-02 MED ORDER — GABAPENTIN 600 MG PO TABS: 600.0000 mg | ORAL_TABLET | Freq: Three times a day (TID) | ORAL | 3 refills | Status: DC | PRN

## 2019-07-02 NOTE — Progress Notes (Signed)
[facility] [address] [place]. [place] [address] [phone]       OFFICE FOLLOW-UP NOTE  [name] Date of Birth:  01/04/1941 Medical Record Number:  [id number]   PCP:  [facility] provider: Dr. Leonie Man Reason for visit: Stroke follow-up   Chief complaint Chief Complaint  Patient presents with  . Follow-up    RM 1 here for a f/u on a stroke. Pt is not having new sx. Pt said she feels tired.     HPI:   Today, 07/02/2019, [name] returns for follow-up regarding prior stroke and chronic history of trigeminal neuralgia.    She has been doing well from a stroke standpoint without new or reoccurring stroke/TIA symptoms.  Denies residual deficits.  She continues on aspirin 81 mg daily and Crestor 5 mg daily for secondary stroke prevention tolerating well without side effects.  Blood pressure today 138/64.  Recently diagnosis of type II diabetes with A1c 7.1.  Recommend attempt of diet control first and advised by PCP for evaluation by nutritionist - has not had evaluation yet.  Continues to follow closely with PCP for HTN, HLD and DM management.  Advised her after prior visit to schedule follow-up with Dr. Oneida Alar is he recommended repeat CUS around 11/2018 but unfortunately follow-up visit has not been made.  Right-sided trigeminal neuralgia stable.  Prior trigeminal flare in February.  Previously managed by Dr. Tomi Likens at [facility] neurology but she was advised to continue to be followed in this office for management.  Unable to tolerate oxcarbazepine as this was trialed as initial treatment.  Use of gabapentin 600 mg up to 3 times daily as needed as provided great benefit.  History of occasional left-sided headaches occurring approximately 1 time per week, pressure type sensation.  Symptoms have been stable without worsening.  Denies visual changes, photophobia, phonophobia, or numbness/tingling.  She will take a 600 mg capsule of gabapentin with benefit.  Postherpetic shingle  pain continued interfering with sleep and recommended trial of amitriptyline 10 mg nightly which she only continued for 1 month and self discontinued due to onset of bilateral knee pain which she contributed to management.  Postherpetic pain stable without current intervention.  She is concerned regarding ongoing fatigue over the past few months as well as onset of dry cough when laying down at night.  Spoke to fatigue with PCP which prompted lab work in April which was unremarkable in regards to fatigue except for findings of new evidence of DM and elevated LFTs (PCP plans on repeating at follow-up in July).  She does report increased difficulty this year with allergies and pollen as well as history of GERD.      History provided for reference purposes only Update 03/28/2019: [name] is a 79 year old female who is being seen today, 03/28/2019, for stroke follow-up.  She has been stable from a stroke standpoint since prior visit without any new or reoccurring stroke/TIA symptoms.  Continues on aspirin 81 mg daily without bleeding or bruising.  Recently initiated Crestor by PCP as difficulty tolerating atorvastatin due to statin myalgias.  Tolerating Crestor well without myalgias.  After prior visit, recommended initiating Zetia but per patient, 5 minutes after taking medication she experienced right hand contractures and after ingestion of mustard, contractures resolved but reports residual stiffness middle finger distal interphalangeal joint.  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  Blood pressure today  146/92.  She was advised to follow-up with repeat carotid ultrasound around 11/2018 but does not appear as though this occurred for unknown reasons.  No further stroke related concerns at this time.  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  Previously being treated by Dr. Tomi Likens at [facility] neurology.  She recently had a flareup of trigeminal neuralgia and attempted to be seen by Dr. Tomi Likens but apparently was not able to be seen as she is currently being seen by our office.  They requested our office take over trigeminal neuralgia and postherpetic neuralgia management.  Currently using gabapentin 600 mg 3 times daily as needed during flareups with benefit.  Continues to experience daily postherpetic neuralgia pain located left T9-T10 without benefit of gabapentin.  Experiences burning sensation daily and worsened at night.  Has trialed oxcarbazepine and baclofen previously with reported difficulty tolerating.    Update 09/21/2018: [name] is a 79 year old female who is being seen today for 67-month stroke follow-up accompanied by her grandson.  She has been stable from a stroke standpoint without new or reoccurring symptoms or residual deficit from prior stroke.  Continues on aspirin without bleeding or bruising.  Lipid panel obtained at prior visit with LDL 102.  It was recommended to initiate Repatha but per patient, she attempted to pick up at her pharmacy and she was told that her insurance did not approve despite PA approval.  She has continued on atorvastatin 80 mg daily at this time and does complain of lower extremity cramping/pain typically worsened by increased exertion.  She has not had any repeat lipid levels at this time as she has not establish care with PCP.  Blood pressure today satisfactory at 126/73.  She does endorse contacting Dr. Eunice Blase office who patient reports being accepted as a patient but insurance requesting referral to be placed.  Denies new or worsening stroke/TIA symptoms.  Initial visit 03/23/2018 PS: [name] is a 79 year old Caucasian lady seen today for initial office follow-up visit following hospital admission for stroke in January 2020.  History is obtained from the patient and review of  electronic medical records.  I have personally reviewed imaging films.  [name] is a pleasant 79 year old Caucasian lady with history of coronary artery disease and hyperlipidemia who presented with sudden onset of right upper extremity numbness.  She was not a TPA candidate due to mild symptoms.  CT scan of the head showed no acute abnormalities.  CT angiogram showed acute infarction in the left frontoparietal junction with 75% stenosis at the left carotid bifurcation and 70% right vertebral origin stenosis.  Carotid ultrasound confirmed 65 to 80% calcific plaque in the left carotid bifurcation MRI scan of the brain confirmed small acute left perisylvian infarcts.  MRI scan cervical spine showed motion degradation but no significant spinal stenosis.  Transthoracic echo showed normal ejection fraction without cardiac source of embolism.  LDL cholesterol was elevated at 169 mg percent and hemoglobin A1c was 5.8.  Patient was found to have no therapy needs and did well and recovered completely.  She was discharged home after consultation with vascular surgery will arrange for elective left carotid endarterectomy on 02/13/2018 which went uneventfully.  Patient states she is done well without recurrent stroke or TIA symptoms.  She has had follow-up with Dr. Oneida Alar on 03/09/2018 and he seemed pleased with the results.  She is on aspirin for stroke prevention and tolerating it well without bleeding or  bruising.  She is tolerating Lipitor 80 mg well but does complain of knee and joint pains.  She has not had any follow-up lipid profile checked.      ROS:   14 system review of systems is positive for neuropathic pain, joint pain, coughing, fatigue and all systems negative  PMH:  Past Medical History:  Diagnosis Date  . Coronary artery disease   . GERD (gastroesophageal reflux disease)   . Headache   . History of hiatal hernia   . Hyperlipidemia   . Myocardial infarction ([place]) 12/1999  . Stroke [facility])      Social History:  Social History   Socioeconomic History  . Marital status: Married    Spouse name: Not on file  . Number of children: Not on file  . Years of education: Not on file  . Highest education level: Not on file  Occupational History  . Not on file  Tobacco Use  . Smoking status: Never Smoker  . Smokeless tobacco: Never Used  Substance and Sexual Activity  . Alcohol use: No  . Drug use: No  . Sexual activity: Not on file  Other Topics Concern  . Not on file  Social History Narrative  . Not on file   Social Determinants of Health   Financial Resource Strain:   . Difficulty of Paying Living Expenses:   Food Insecurity:   . Worried About Charity fundraiser in the Last Year:   . Arboriculturist in the Last Year:   Transportation Needs:   . Film/video editor (Medical):   Marland Kitchen Lack of Transportation (Non-Medical):   Physical Activity:   . Days of Exercise per Week:   . Minutes of Exercise per Session:   Stress:   . Feeling of Stress :   Social Connections:   . Frequency of Communication with Friends and Family:   . Frequency of Social Gatherings with Friends and Family:   . Attends Religious Services:   . Active Member of Clubs or Organizations:   . Attends Archivist Meetings:   Marland Kitchen Marital Status:   Intimate Partner Violence:   . Fear of Current or Ex-Partner:   . Emotionally Abused:   Marland Kitchen Physically Abused:   . Sexually Abused:     Medications:   Current Outpatient Medications on File Prior to Visit  Medication Sig Dispense Refill  . amitriptyline (ELAVIL) 10 MG tablet Take 1 tablet (10 mg total) by mouth at bedtime. 30 tablet 3  . aspirin 81 MG chewable tablet Chew 1 tablet (81 mg total) by mouth daily. 30 tablet 0  . gabapentin (NEURONTIN) 600 MG tablet Take 600 mg by mouth 3 (three) times daily as needed. For trigeminal neuralgia    . omeprazole (PRILOSEC) 20 MG capsule TAKE 1 CAPSULE(20 MG) BY MOUTH DAILY AS NEEDED 90 capsule 1  .  rosuvastatin (CRESTOR) 5 MG tablet Take 1 tablet (5 mg total) by mouth daily. 90 tablet 1  . vitamin C (ASCORBIC ACID) 500 MG tablet Take 500 mg by mouth daily.    . [DISCONTINUED] ezetimibe (ZETIA) 10 MG tablet Take 1 tablet (10 mg total) by mouth daily. 30 tablet 2   No current facility-administered medications on file prior to visit.    Allergies:   Allergies  Allergen Reactions  . Atorvastatin     Knee pain     Today's Vitals   07/02/19 1526  BP: 138/64  Pulse: 83  Weight: 136 lb (61.7 kg)  Height: 5\' 2"  (1.575 m)   Body mass index is 24.87 kg/m.  Physical Exam General: well developed, well nourished pleasant elderly Caucasian lady, seated, in no evident distress Head: head normocephalic and atraumatic.  Neck: supple with no carotid or supraclavicular bruits.  Cardiovascular: regular rate and rhythm, no murmurs Musculoskeletal: Bilateral hand arthritic nodules Skin:  no rash/petichiae  Neurologic Exam Mental Status: Awake and fully alert.  Normal speech and language.  Oriented to place and time. Recent and remote memory intact. Attention span, concentration and fund of knowledge appropriate. Mood and affect appropriate.  Cranial Nerves: Pupils equal, briskly reactive to light. Extraocular movements full without nystagmus. Visual fields full to confrontation. Hearing intact. Facial sensation intact. Face, tongue, palate moves normally and symmetrically.  Motor: Normal bulk and tone. Normal strength in all tested extremity muscles. Sensory.: intact to touch ,pinprick .position and vibratory sensation.  Coordination: Rapid alternating movements normal in all extremities. Finger-to-nose and heel-to-shin performed accurately bilaterally. Gait and Station: Arises from chair without difficulty. Stance is normal. Gait demonstrates normal stride length and balance . Able to heel, toe and tandem walk without difficulty.  Reflexes: 1+ and symmetric. Toes downgoing.       ASSESSMENT: 48 year patient with left MCA branch infarct in January 2020 due to symptomatic proximal left carotid stenosis.  Status post interval elective left carotid endarterectomy on 02/13/2018 who is doing clinically quite well with full recovery.  Vascular risk factors of hyperlipidemia and carotid stenosis with recent diagnosis of DM.  History of right-sided trigeminal neuralgia and left postherpetic neuralgia 2/2 hx of shingles approximately 2 years ago.  Previously managed by Dr. Tomi Likens who requested our office take over management.     PLAN: 1. Left MCA infarct: Continue aspirin 81 mg daily  and Crestor for secondary stroke prevention. Maintain strict control of hypertension with blood pressure goal below 130/90, diabetes with hemoglobin A1c goal below 6.5% and cholesterol with LDL cholesterol (bad cholesterol) goal below 70 mg/dL.  I also advised the patient to eat a healthy diet with plenty of whole grains, cereals, fruits and vegetables, exercise regularly with at least 30 minutes of continuous activity daily and maintain ideal body weight. 2. HTN: Stable.  Continue to follow with PCP for monitoring and management 3. HLD: Continuation of current statin use and ongoing follow-up and management by PCP 4. New dx DM type 2: Recent A1c 7.1.  Advised to reschedule evaluation with nutritionist for attempt of diet control and to continue to follow with PCP for ongoing monitoring and management 5. Carotid stenosis s/p endarterectomy: Again advised her to schedule follow-up visit with Dr. Oneida Alar -we will also attempt to reach out to the office for further assistance 6. Trigeminal neuralgia, right: Has been stable without recent flare.  Gabapentin 600 mg up to 3 times daily as needed.  Advised to call office with any flare not well managed with use of gabapentin. 7. Advised her to follow-up with PCP in regards to ongoing fatigue symptoms as well as dry cough while laying down.  Denies shortness  of breath, chest tightness, pressure or pain associated with laying down or dry cough.  Possibly related to allergies and postnasal drip or history of GERD.    Follow-up in 6 months or call earlier if needed   I spent 33 minutes of face-to-face and non-face-to-face time with patient.  This included previsit chart review, lab review, study review, order entry, electronic health record documentation, patient education   Frann Rider, AGNP-BC  [facility] [address] [place], [place] [address]  Phone [phone] Fax [phone] Note: This document was prepared with digital dictation and possible smart phrase technology. Any transcriptional errors that result from this process are unintentional.

## 2019-07-02 NOTE — Patient Instructions (Addendum)
Continue aspirin 81 mg daily  and Crestor  for secondary stroke prevention  Continue to follow up with PCP regarding diabetes, cholesterol and blood pressure management   Your cough could possibly be due to allergies or acid reflux - recommend use of prescribed omeprazole for acid reflux symptoms. Please speak further with your PCP if you continue to experience difficulty with coughing at night for further evaluation   Continue use of gabapentin 600mg  three times daily as needed for headaches and trigeminal neuralgia   Continue to monitor blood pressure at home  Maintain strict control of hypertension with blood pressure goal below 130/90, diabetes with hemoglobin A1c goal below 6.5% and cholesterol with LDL cholesterol (bad cholesterol) goal below 70 mg/dL. I also advised the patient to eat a healthy diet with plenty of whole grains, cereals, fruits and vegetables, exercise regularly and maintain ideal body weight.  Followup in the future with me in 6 months or call earlier if needed       Thank you for coming to see [place] at [facility]. I hope we have been able to provide you high quality care today.  You may receive a patient satisfaction survey over the next few weeks. We would appreciate your feedback and comments so that we may continue to improve ourselves and the health of our patients.

## 2019-07-03 NOTE — Progress Notes (Signed)
I agree with the above plan 

## 2019-07-03 NOTE — Progress Notes (Signed)
Sounds good.  Greatly appreciate you following up!

## 2019-07-05 ENCOUNTER — Telehealth: Payer: Self-pay

## 2019-07-05 NOTE — Telephone Encounter (Signed)
Called pt to let her know her f/u appt date/time for July. Pt verbalized understanding.

## 2019-08-01 ENCOUNTER — Other Ambulatory Visit: Payer: Self-pay

## 2019-08-01 ENCOUNTER — Ambulatory Visit (INDEPENDENT_AMBULATORY_CARE_PROVIDER_SITE_OTHER): Payer: Medicare HMO

## 2019-08-01 DIAGNOSIS — E119 Type 2 diabetes mellitus without complications: Secondary | ICD-10-CM | POA: Diagnosis not present

## 2019-08-01 DIAGNOSIS — R5383 Other fatigue: Secondary | ICD-10-CM | POA: Diagnosis not present

## 2019-08-01 DIAGNOSIS — R945 Abnormal results of liver function studies: Secondary | ICD-10-CM

## 2019-08-01 DIAGNOSIS — R7989 Other specified abnormal findings of blood chemistry: Secondary | ICD-10-CM

## 2019-08-01 NOTE — Progress Notes (Signed)
3 months' recheck blood work drawn per Dr. Junius Roads: Hgb A1C, Thyroid panel + TSH and CMET.

## 2019-08-02 ENCOUNTER — Telehealth: Payer: Self-pay | Admitting: Family Medicine

## 2019-08-02 ENCOUNTER — Other Ambulatory Visit: Payer: Self-pay | Admitting: *Deleted

## 2019-08-02 DIAGNOSIS — E039 Hypothyroidism, unspecified: Secondary | ICD-10-CM | POA: Insufficient documentation

## 2019-08-02 DIAGNOSIS — L989 Disorder of the skin and subcutaneous tissue, unspecified: Secondary | ICD-10-CM

## 2019-08-02 DIAGNOSIS — I6522 Occlusion and stenosis of left carotid artery: Secondary | ICD-10-CM

## 2019-08-02 LAB — HEMOGLOBIN A1C
Hgb A1c MFr Bld: 6.5 % of total Hgb — ABNORMAL HIGH (ref <5.7)
Mean Plasma Glucose: 140 (calc)
eAG (mmol/L): 7.7 (calc)

## 2019-08-02 LAB — COMPREHENSIVE METABOLIC PANEL
AG Ratio: 1.1 (calc) (ref 1.0–2.5)
ALT: 15 U/L (ref 6–29)
AST: 21 U/L (ref 10–35)
Albumin: 3.9 g/dL (ref 3.6–5.1)
Alkaline phosphatase (APISO): 72 U/L (ref 37–153)
BUN: 20 mg/dL (ref 7–25)
CO2: 27 mmol/L (ref 20–32)
Calcium: 9.1 mg/dL (ref 8.6–10.4)
Chloride: 104 mmol/L (ref 98–110)
Creat: 0.63 mg/dL (ref 0.60–0.93)
Globulin: 3.5 g/dL (calc) (ref 1.9–3.7)
Glucose, Bld: 113 mg/dL — ABNORMAL HIGH (ref 65–99)
Potassium: 4.2 mmol/L (ref 3.5–5.3)
Sodium: 138 mmol/L (ref 135–146)
Total Bilirubin: 0.7 mg/dL (ref 0.2–1.2)
Total Protein: 7.4 g/dL (ref 6.1–8.1)

## 2019-08-02 LAB — THYROID PANEL WITH TSH
Free Thyroxine Index: 1.6 (ref 1.4–3.8)
T3 Uptake: 28 % (ref 22–35)
T4, Total: 5.6 ug/dL (ref 5.1–11.9)
TSH: 6.39 mIU/L — ABNORMAL HIGH (ref 0.40–4.50)

## 2019-08-02 MED ORDER — LEVOTHYROXINE SODIUM 25 MCG PO TABS: 25.0000 ug | ORAL_TABLET | Freq: Every day | ORAL | 6 refills | Status: DC

## 2019-08-02 NOTE — Telephone Encounter (Signed)
Thyroid TSH looks worse at 6.39.  I believe she is hypothyroid now.  I will call in a prescription for Synthroid and we will recheck labs in about 3 months.  Diabetes looks better with hemoglobin A1c of 6.5.  Recheck in about 6 months.

## 2019-08-02 NOTE — Telephone Encounter (Signed)
I called and advised the patient of her lab results. Advised her of the new medication and how to take it: on an empty stomach first a.m. and wait 30 minutes before eating. Appt for labwork (for thyroid) scheduled for 11/05/19. Will wait to schedule the Hgb A1C until a little closer to 6 months from now.

## 2019-08-09 ENCOUNTER — Ambulatory Visit: Payer: Medicare HMO

## 2019-08-09 ENCOUNTER — Encounter (HOSPITAL_COMMUNITY): Payer: Medicare HMO

## 2019-08-28 DIAGNOSIS — D225 Melanocytic nevi of trunk: Secondary | ICD-10-CM | POA: Diagnosis not present

## 2019-08-28 DIAGNOSIS — C44311 Basal cell carcinoma of skin of nose: Secondary | ICD-10-CM | POA: Diagnosis not present

## 2019-09-24 ENCOUNTER — Telehealth: Payer: Self-pay | Admitting: Adult Health

## 2019-09-24 MED ORDER — CARBAMAZEPINE ER 100 MG PO CP12: 100.0000 mg | ORAL_CAPSULE | Freq: Two times a day (BID) | ORAL | 3 refills | Status: DC

## 2019-09-24 NOTE — Telephone Encounter (Signed)
Order placed to initiate carbamazepine 100 mg twice daily due to TN exacerbation

## 2019-09-24 NOTE — Telephone Encounter (Signed)
I spoke to pt and she will be glad to try anything that will help.  carbamzaepine 50mg  po bid.  I told her to hold off tylenol 1000mg  as can cause rebound headaches and not good for the liver.  She has skin surgery for skin cancer (nose) to morrow I relayed to let her surgeon know about this.

## 2019-09-24 NOTE — Telephone Encounter (Signed)
Pt called wanting to know if something can be called in for her severe headaches she is experiencing. She states that she feels like her head is "going to explode." Please advise.

## 2019-09-24 NOTE — Telephone Encounter (Signed)
To clarify, has she been taking a total of 1200mg  three times daily of gabapentin or only 600 mg 3 times daily?

## 2019-09-24 NOTE — Telephone Encounter (Signed)
I clarified that with her multiple times.

## 2019-09-24 NOTE — Telephone Encounter (Signed)
I called pt.  She stated that on Friday she started with flare of TN. R side face radiating going to top of her head.  Worse ever.  She is taking gabapentin 600mg  taking 2 tablets po TID along with tylenol 1000mg .  Was so bad over weekend thought about goint to ED but did not. She stated spoke to pharmacist and was told to take gabapenin and tylenol.  Please advise.  She mentioned tic (which is the sharp shooting pain).

## 2019-09-24 NOTE — Telephone Encounter (Signed)
Okay wanted to clarify as she was only prescribed 600 mg 3 times daily.  Unable to further increase gabapentin if currently taking 1200 mg 3 times daily.  Recommend trialing carbamazepine 50 mg twice daily.  Prior intolerance to oxcarbazepine so please ensure she calls [place] with any difficulty tolerating.

## 2019-09-25 ENCOUNTER — Other Ambulatory Visit: Payer: Self-pay

## 2019-09-25 ENCOUNTER — Emergency Department (HOSPITAL_COMMUNITY)
Admission: EM | Admit: 2019-09-25 | Discharge: 2019-09-25 | Disposition: A | Discharge status: Home / Self Care | Payer: Medicare HMO | Attending: Emergency Medicine | Admitting: Emergency Medicine

## 2019-09-25 ENCOUNTER — Encounter (HOSPITAL_COMMUNITY): Payer: Self-pay

## 2019-09-25 ENCOUNTER — Emergency Department (HOSPITAL_COMMUNITY): Payer: Medicare HMO

## 2019-09-25 DIAGNOSIS — Z79890 Hormone replacement therapy (postmenopausal): Secondary | ICD-10-CM | POA: Diagnosis not present

## 2019-09-25 DIAGNOSIS — Z79899 Other long term (current) drug therapy: Secondary | ICD-10-CM | POA: Insufficient documentation

## 2019-09-25 DIAGNOSIS — Z7982 Long term (current) use of aspirin: Secondary | ICD-10-CM | POA: Diagnosis not present

## 2019-09-25 DIAGNOSIS — E119 Type 2 diabetes mellitus without complications: Secondary | ICD-10-CM | POA: Insufficient documentation

## 2019-09-25 DIAGNOSIS — I2510 Atherosclerotic heart disease of native coronary artery without angina pectoris: Secondary | ICD-10-CM | POA: Diagnosis not present

## 2019-09-25 DIAGNOSIS — R519 Headache, unspecified: Secondary | ICD-10-CM | POA: Diagnosis not present

## 2019-09-25 LAB — CBC WITH DIFFERENTIAL/PLATELET
Abs Immature Granulocytes: 0.18 10*3/uL — ABNORMAL HIGH (ref 0.00–0.07)
Basophils Absolute: 0 10*3/uL (ref 0.0–0.1)
Basophils Relative: 0 %
Eosinophils Absolute: 0.1 10*3/uL (ref 0.0–0.5)
Eosinophils Relative: 1 %
HCT: 38.1 % (ref 36.0–46.0)
Hemoglobin: 12.3 g/dL (ref 12.0–15.0)
Immature Granulocytes: 2 %
Lymphocytes Relative: 26 %
Lymphs Abs: 2.5 10*3/uL (ref 0.7–4.0)
MCH: 29.4 pg (ref 26.0–34.0)
MCHC: 32.3 g/dL (ref 30.0–36.0)
MCV: 90.9 fL (ref 80.0–100.0)
Monocytes Absolute: 0.6 10*3/uL (ref 0.1–1.0)
Monocytes Relative: 7 %
Neutro Abs: 6.2 10*3/uL (ref 1.7–7.7)
Neutrophils Relative %: 64 %
Platelets: 275 10*3/uL (ref 150–400)
RBC: 4.19 MIL/uL (ref 3.87–5.11)
RDW: 13.2 % (ref 11.5–15.5)
WBC: 9.7 10*3/uL (ref 4.0–10.5)
nRBC: 0 % (ref 0.0–0.2)

## 2019-09-25 LAB — BASIC METABOLIC PANEL
Anion gap: 11 (ref 5–15)
BUN: 13 mg/dL (ref 8–23)
CO2: 24 mmol/L (ref 22–32)
Calcium: 8.8 mg/dL — ABNORMAL LOW (ref 8.9–10.3)
Chloride: 101 mmol/L (ref 98–111)
Creatinine, Ser: 0.71 mg/dL (ref 0.44–1.00)
GFR calc Af Amer: >60 mL/min (ref >60)
GFR calc non Af Amer: >60 mL/min (ref >60)
Glucose, Bld: 193 mg/dL — ABNORMAL HIGH (ref 70–99)
Potassium: 4.7 mmol/L (ref 3.5–5.1)
Sodium: 136 mmol/L (ref 135–145)

## 2019-09-25 MED ORDER — PROCHLORPERAZINE EDISYLATE 10 MG/2ML IJ SOLN
10.0000 mg | Freq: Once | INTRAMUSCULAR | Status: AC
  Administered 2019-09-25: 10 mg via INTRAVENOUS
  Filled 2019-09-25: qty 2

## 2019-09-25 MED ORDER — HYDROMORPHONE HCL 1 MG/ML IJ SOLN
1.0000 mg | Freq: Once | INTRAMUSCULAR | Status: AC
  Administered 2019-09-25: 1 mg via INTRAMUSCULAR
  Filled 2019-09-25: qty 1

## 2019-09-25 MED ORDER — ONDANSETRON HCL 4 MG/2ML IJ SOLN
4.0000 mg | Freq: Once | INTRAMUSCULAR | Status: AC
  Administered 2019-09-25: 4 mg via INTRAVENOUS
  Filled 2019-09-25: qty 2

## 2019-09-25 MED ORDER — CARBAMAZEPINE 200 MG PO TABS
100.0000 mg | ORAL_TABLET | Freq: Once | ORAL | Status: AC
  Administered 2019-09-25: 100 mg via ORAL
  Filled 2019-09-25: qty 0.5

## 2019-09-25 MED ORDER — DIPHENHYDRAMINE HCL 50 MG/ML IJ SOLN
25.0000 mg | Freq: Once | INTRAMUSCULAR | Status: AC
  Administered 2019-09-25: 25 mg via INTRAVENOUS
  Filled 2019-09-25: qty 1

## 2019-09-25 MED ORDER — SODIUM CHLORIDE 0.9 % IV BOLUS
500.0000 mL | Freq: Once | INTRAVENOUS | Status: AC
  Administered 2019-09-25: 500 mL via INTRAVENOUS

## 2019-09-25 NOTE — Discharge Instructions (Signed)
Your headache is likely due to your trigeminal neuralgia.  Your neurologist recently prescribed carbamazepine 100 mg twice daily.  Please start taking that medication as prescribed as it will help you with your headache.  You may continue taking Neurontin as well as Tylenol for your headache.  Return if you have any concern.

## 2019-09-25 NOTE — ED Provider Notes (Signed)
Medical screening examination/treatment/procedure(s) were conducted as a shared visit with non-physician practitioner(s) and myself.  I personally evaluated the patient during the encounter. Briefly, the patient is a 79 y.o. female with history of headaches, trigeminal neuralgia, high cholesterol, stroke who presents the ED with right-sided headache.  Similar to her trigeminal neuralgia in the past.  She started on carbamazepine recently.  Is also on gabapentin.  Symptoms worse this morning.  She appears overall neurologically intact but she is very symptomatic.  She is given a dose of IM narcotic without much improvement.  Now feeling nausea and vomiting.  We will get a head CT basic labs and continue treatment with Compazine, Benadryl.  Patient to be given home carbamazepine dose which she has not taking it today.  We will continue symptomatic treatment and anticipate discharge home.  This chart was dictated using voice recognition software.  Despite best efforts to proofread,  errors can occur which can change the documentation meaning.    EKG Interpretation None           Lennice Sites, DO 09/25/19 2620

## 2019-09-25 NOTE — ED Triage Notes (Signed)
Pt reports right sided headache/neuralgia intermittent since Friday. Pt took 2 gabapentin this morning. Pain has improved. Pt a.o

## 2019-09-25 NOTE — ED Notes (Signed)
Pt complains of dizziness and headache while ambulating.

## 2019-09-25 NOTE — ED Provider Notes (Signed)
[facility] EMERGENCY DEPARTMENT Provider Note   CSN: [id number] Arrival date & time: 09/25/19  1761     History Chief Complaint  Patient presents with  . Headache    Debra Fry is a 79 y.o. female.  The history is provided by the patient and medical records. No language interpreter was used.  Headache    79 year old female with history of recurrent headache, prior stroke, MI, CAD, GERD presenting to the ER from home for evaluation of headache.  Patient has known history of trigeminal neuralgia.  For the past 5 days she has had intermittent pain involving the right side of her face that felt similar to prior neuralgia pain.  Pain is described as sharp shooting, Ice pick stabbing sensation that happened intermittently and very severe.  Pain is associated with tearing of the right eye, and at times intense.  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  Her pain became intense this morning prompting this ER visit.  She denies any associated fever, diplopia, confusion, facial droop, neck pain, chest pain, focal numbness or focal weakness.  She denies any recent trauma.  She is up-to-date with her COVID-19 vaccination.  She denies any runny nose sneezing coughing loss of taste or smell.  Past Medical History:  Diagnosis Date  . Coronary artery disease   . GERD (gastroesophageal reflux disease)   . Headache   . History of hiatal hernia   . Hyperlipidemia   . Myocardial infarction ([place]) 12/1999  . Stroke [facility])     Patient Active Problem List   Diagnosis Date Noted  . Hypothyroidism 08/02/2019  . Diabetes ([place]) 05/01/2019  . Hyperglycemia 09/27/2018  . 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 Acute CVA (cerebrovascular accident) ([place]) 01/27/2018    Past Surgical History:  Procedure  Laterality Date  . CARDIAC CATHETERIZATION  2001   results in [place] conversion  . CORONARY ARTERY BYPASS GRAFT  2001  . ENDARTERECTOMY Left 02/13/2018   Procedure: ENDARTERECTOMY CAROTID;  Surgeon: Elam Dutch, MD;  Location: [facility];  Service: Vascular;  Laterality: Left;  . OPEN REDUCTION INTERNAL FIXATION (ORIF) DISTAL RADIAL FRACTURE Left 11/23/2018   Procedure: OPEN REDUCTION INTERNAL FIXATION (ORIF) LEFT DISTAL RADIAL AND ULNA  FRACTURES;  Surgeon: [name], MD;  Location: [facility];  Service: Orthopedics;  Laterality: Left;     OB History   No obstetric history on file.     Family History  Family history unknown: Yes    Social History   Tobacco Use  . Smoking status: Never Smoker  . Smokeless tobacco: Never Used  Vaping Use  . Vaping Use: Never used  Substance Use Topics  . Alcohol use: No  . Drug use: No    Home Medications Prior to Admission medications   Medication Sig Start Date End Date Taking? Authorizing Provider  aspirin 81 MG chewable tablet Chew 1 tablet (81 mg total) by mouth daily. 01/30/18   Raiford Noble Latif, DO  carbamazepine (CARBATROL) 100 MG 12 hr capsule Take 1 capsule (100 mg total) by mouth 2 (two) times daily. 09/24/19   Frann Rider, NP  gabapentin (NEURONTIN) 600 MG tablet Take 1 tablet (600 mg total) by mouth 3 (three) times daily as needed. For trigeminal neuralgia 07/02/19   Frann Rider, NP  levothyroxine (  SYNTHROID) 25 MCG tablet Take 1 tablet (25 mcg total) by mouth daily before breakfast. 08/02/19   Hilts, Legrand Como, MD  omeprazole (PRILOSEC) 20 MG capsule TAKE 1 CAPSULE(20 MG) BY MOUTH DAILY AS NEEDED 05/01/19   Hilts, Legrand Como, MD  rosuvastatin (CRESTOR) 5 MG tablet Take 1 tablet (5 mg total) by mouth daily. 12/27/18   Hilts, Legrand Como, MD  vitamin C (ASCORBIC ACID) 500 MG tablet Take 500 mg by mouth daily.    [provider]  ezetimibe (ZETIA) 10 MG tablet Take 1 tablet (10 mg total) by mouth daily. 09/25/18  11/14/18  Frann Rider, NP    Allergies    Atorvastatin  Review of Systems   Review of Systems  Neurological: Positive for headaches.  All other systems reviewed and are negative.   Physical Exam Updated Vital Signs BP (!) 138/101   Pulse 68   Temp 97.7 F (36.5 C) (Oral)   Resp 18   Ht 5\' 2"  (1.575 m)   Wt 61.7 kg   SpO2 98%   BMI 24.88 kg/m   Physical Exam Vitals and nursing note reviewed.  Constitutional:      General: She is not in acute distress.    Appearance: She is well-developed.  HENT:     Head: Atraumatic.  Eyes:     General: No visual field deficit.    Extraocular Movements: Extraocular movements intact.     Conjunctiva/sclera: Conjunctivae normal.     Pupils: Pupils are equal, round, and reactive to light.  Musculoskeletal:     Cervical back: Normal range of motion and neck supple. No rigidity.  Skin:    Findings: No rash.  Neurological:     Mental Status: She is alert and oriented to person, place, and time.     GCS: GCS eye subscore is 4. GCS verbal subscore is 5. GCS motor subscore is 6.     Cranial Nerves: No cranial nerve deficit, dysarthria or facial asymmetry.     Sensory: No sensory deficit.  Psychiatric:        Mood and Affect: Mood normal.     ED Results / Procedures / Treatments   Labs (all labs ordered are listed, but only abnormal results are displayed) Labs Reviewed  CBC WITH DIFFERENTIAL/PLATELET - Abnormal; Notable for the following components:      Result Value   Abs Immature Granulocytes 0.18 (*)    All other components within normal limits  BASIC METABOLIC PANEL - Abnormal; Notable for the following components:   Glucose, Bld 193 (*)    Calcium 8.8 (*)    All other components within normal limits    EKG None  Radiology No results found.  Procedures Procedures (including critical care time)  Medications Ordered in ED Medications  HYDROmorphone (DILAUDID) injection 1 mg (1 mg Intramuscular Given 09/25/19 0821)   prochlorperazine (COMPAZINE) injection 10 mg (10 mg Intravenous Given 09/25/19 0942)  ondansetron (ZOFRAN) injection 4 mg (4 mg Intravenous Given 09/25/19 0940)  diphenhydrAMINE (BENADRYL) injection 25 mg (25 mg Intravenous Given 09/25/19 0940)  carbamazepine (TEGRETOL) tablet 100 mg (100 mg Oral Given 09/25/19 0953)  sodium chloride 0.9 % bolus 500 mL (0 mLs Intravenous Stopped 09/25/19 1500)    ED Course  I have reviewed the triage vital signs and the nursing notes.  Pertinent labs & imaging results that were available during my care of the patient were reviewed by me and considered in my medical decision making (see chart for details).    MDM Rules/Calculators/A&P  BP (!) 130/58   Pulse 60   Temp 97.7 F (36.5 C) (Oral)   Resp 18   Ht 5\' 2"  (1.575 m)   Wt 61.7 kg   SpO2 94%   BMI 24.88 kg/m   Final Clinical Impression(s) / ED Diagnoses Final diagnoses:  Bad headache    Rx / DC Orders ED Discharge Orders    None     8:05 AM Patient with known history of trigeminal neuralgia here with right-sided facial pain, likely related to her trigeminal neuralgia.  She has increasing pain due to being n.p.o. in preparation for her facial surgery to remove skin cancer later on today.  Her pain is episodic.  No red flags.  No COVID-19 symptoms.  No strokelike symptoms.  Will provide pain medication, anticipate discharge home.  She was recently prescribed carbamazepine yesterday but have not started this medication yet.   Domenic Moras, PA-C 09/30/19 [place], [place], DO 09/30/19 2329

## 2019-09-25 NOTE — ED Notes (Signed)
Pt oxygen dropped to 89% while sleeping after benadryl admin, pt placed on 2L [place]

## 2019-09-26 ENCOUNTER — Ambulatory Visit: Payer: Medicare HMO | Admitting: Adult Health

## 2019-09-26 ENCOUNTER — Encounter: Payer: Self-pay | Admitting: Adult Health

## 2019-09-26 VITALS — BP 172/73 | HR 68 | Ht 61.0 in | Wt 138.0 lb

## 2019-09-26 DIAGNOSIS — Z9889 Other specified postprocedural states: Secondary | ICD-10-CM | POA: Diagnosis not present

## 2019-09-26 DIAGNOSIS — E785 Hyperlipidemia, unspecified: Secondary | ICD-10-CM

## 2019-09-26 DIAGNOSIS — Z8673 Personal history of transient ischemic attack (TIA), and cerebral infarction without residual deficits: Secondary | ICD-10-CM | POA: Diagnosis not present

## 2019-09-26 DIAGNOSIS — I10 Essential (primary) hypertension: Secondary | ICD-10-CM

## 2019-09-26 DIAGNOSIS — E119 Type 2 diabetes mellitus without complications: Secondary | ICD-10-CM | POA: Diagnosis not present

## 2019-09-26 DIAGNOSIS — G500 Trigeminal neuralgia: Secondary | ICD-10-CM | POA: Diagnosis not present

## 2019-09-26 MED ORDER — ROSUVASTATIN CALCIUM 5 MG PO TABS
5.0000 mg | ORAL_TABLET | Freq: Every day | ORAL | 1 refills | Status: DC
Start: 2019-09-26 — End: 2023-02-16

## 2019-09-26 MED ORDER — CARBAMAZEPINE ER 100 MG PO CP12
100.0000 mg | ORAL_CAPSULE | Freq: Two times a day (BID) | ORAL | 3 refills | Status: DC | PRN
Start: 2019-09-26 — End: 2020-09-15

## 2019-09-26 MED ORDER — EZETIMIBE 10 MG PO TABS
10.0000 mg | ORAL_TABLET | Freq: Every day | ORAL | 2 refills | Status: DC
Start: 2019-09-26 — End: 2023-02-16

## 2019-09-26 NOTE — Progress Notes (Signed)
I agree with the above plan 

## 2019-09-26 NOTE — Progress Notes (Signed)
[facility] [address] [place]. [place] [address] [phone]       OFFICE FOLLOW-UP NOTE  Debra Fry Date of Birth:  [birth year] Medical Record Number:  [id number]   PCP:  [facility] provider: Dr. Leonie Man Reason for visit: Trigeminal neuralgia flareup   Chief complaint Chief Complaint  Patient presents with  . Follow-up    tx rm  . Cerebrovascular Accident    Pt said her head has been hurting since friday. Pt is not currently having a headache.     HPI:   Today, 09/26/2019, Debra Fry returns per patient request due to flareup of trigeminal neuralgia Initially seen in office for stroke follow-up previously followed by [facility] neurology for TN but they refused further follow-up visits as she was seeing our office for stroke therefore we took over her care  She called office on 8/30 due to flareup of trigeminal neuralgia starting on 8/27 previously stable on gabapentin 600 mg 3 times daily as needed Apparently, she self increased dose to 1200 mg 3 times daily due to severe pain Carbamazepine 100 mg twice daily initiated by prior to being able to start medication, she presented to ED yesterday (8/31) due to continued severe right-sided headache similar to trigeminal neuralgia pain. CT head obtained negative for acute abnormality.  Complains of nausea with vomiting receiving Compazine and Benadryl.  She was discharged back home after receiving carbamazepine dosage.  Reports she woke up this morning and had complete resolution of trigeminal nerve pain She did take carbamazepine this morning as well as gabapentin dosage So far, tolerating carbamazepine without side effects Previously on oxcarbazepine and baclofen by prior neurology office but unable to tolerate  Stable from stroke standpoint Remains on aspirin 81 mg daily without bleeding or bruising Previously on Crestor and Zetia but ran out of prescription recently -declined side effects while taking Blood  pressure today elevated but monitors at home which has been stable  No further concerns     History provided for reference purposes only Update 07/02/2019 Debra Fry: Debra Fry returns for follow-up regarding prior stroke and chronic history of trigeminal neuralgia.    She has been doing well from a stroke standpoint without new or reoccurring stroke/TIA symptoms.  Denies residual deficits.  She continues on aspirin 81 mg daily and Crestor 5 mg daily for secondary stroke prevention tolerating well without side effects.  Blood pressure today 138/64.  Recently diagnosis of type II diabetes with A1c 7.1.  Recommend attempt of diet control first and advised by PCP for evaluation by nutritionist - has not had evaluation yet.  Continues to follow closely with PCP for HTN, HLD and DM management.  Advised her after prior visit to schedule follow-up with Dr. Oneida Alar is he recommended repeat CUS around 11/2018 but unfortunately follow-up visit has not been made.  Right-sided trigeminal neuralgia stable.  Prior trigeminal flare in February.  Previously managed by Dr. Tomi Likens at [facility] neurology but she was advised to continue to be followed in this office for management.  Unable to tolerate oxcarbazepine as this was trialed as initial treatment.  Use of gabapentin 600 mg up to 3 times daily as needed as provided great benefit.  History of occasional left-sided headaches occurring approximately 1 time per week, pressure type sensation.  Symptoms have been stable without worsening.  Denies visual changes, photophobia, phonophobia, or numbness/tingling.  She will take a 600 mg capsule of gabapentin with benefit.  Postherpetic shingle pain continued interfering with sleep and recommended trial  of amitriptyline 10 mg nightly which she only continued for 1 month and self discontinued due to onset of bilateral knee pain which she contributed to management.  Postherpetic pain stable without current intervention.  She is concerned  regarding ongoing fatigue over the past few months as well as onset of dry cough when laying down at night.  Spoke to fatigue with PCP which prompted lab work in April which was unremarkable in regards to fatigue except for findings of new evidence of DM and elevated LFTs (PCP plans on repeating at follow-up in July).  She does report increased difficulty this year with allergies and pollen as well as history of GERD.  Update 03/28/2019: [name] is a 79 year old female who is being seen today, 03/28/2019, for stroke follow-up.  She has been stable from a stroke standpoint since prior visit without any new or reoccurring stroke/TIA symptoms.  Continues on aspirin 81 mg daily without bleeding or bruising.  Recently initiated Crestor by PCP as difficulty tolerating atorvastatin due to statin myalgias.  Tolerating Crestor well without myalgias.  After prior visit, recommended initiating Zetia but per patient, 5 minutes after taking medication she experienced right hand contractures and after ingestion of mustard, contractures resolved but reports residual stiffness middle finger distal interphalangeal joint.  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  Blood pressure today 146/92.  She was advised to follow-up with repeat carotid ultrasound around 11/2018 but does not appear as though this occurred for unknown reasons.  No further stroke related concerns at this time.  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  Previously being treated by Dr. Tomi Likens at [facility] neurology.  She recently had a flareup of trigeminal neuralgia and attempted to be seen by Dr. Tomi Likens but apparently was not able to be seen as she is currently being seen by our office.  They requested our office take over trigeminal neuralgia and postherpetic neuralgia management.   Currently using gabapentin 600 mg 3 times daily as needed during flareups with benefit.  Continues to experience daily postherpetic neuralgia pain located left T9-T10 without benefit of gabapentin.  Experiences burning sensation daily and worsened at night.  Has trialed oxcarbazepine and baclofen previously with reported difficulty tolerating.    Update 09/21/2018: Debra Fry is a 79 year old female who is being seen today for 45-month stroke follow-up accompanied by her grandson.  She has been stable from a stroke standpoint without new or reoccurring symptoms or residual deficit from prior stroke.  Continues on aspirin without bleeding or bruising.  Lipid panel obtained at prior visit with LDL 102.  It was recommended to initiate Repatha but per patient, she attempted to pick up at her pharmacy and she was told that her insurance did not approve despite PA approval.  She has continued on atorvastatin 80 mg daily at this time and does complain of lower extremity cramping/pain typically worsened by increased exertion.  She has not had any repeat lipid levels at this time as she has not establish care with PCP.  Blood pressure today satisfactory at 126/73.  She does endorse contacting Dr. Eunice Blase office who patient reports being accepted as a patient but insurance requesting referral to be placed.  Denies new or worsening stroke/TIA symptoms.  Initial visit 03/23/2018 PS: Debra Fry is a 79 year old Caucasian lady seen today for initial office follow-up visit  following hospital admission for stroke in January 2020.  History is obtained from the patient and review of electronic medical records.  I have personally reviewed imaging films.  Debra Fry is a pleasant 79 year old Caucasian lady with history of coronary artery disease and hyperlipidemia who presented with sudden onset of right upper extremity numbness.  She was not a TPA candidate due to mild symptoms.  CT scan of the head showed no acute abnormalities.  CT  angiogram showed acute infarction in the left frontoparietal junction with 75% stenosis at the left carotid bifurcation and 70% right vertebral origin stenosis.  Carotid ultrasound confirmed 65 to 80% calcific plaque in the left carotid bifurcation MRI scan of the brain confirmed small acute left perisylvian infarcts.  MRI scan cervical spine showed motion degradation but no significant spinal stenosis.  Transthoracic echo showed normal ejection fraction without cardiac source of embolism.  LDL cholesterol was elevated at 169 mg percent and hemoglobin A1c was 5.8.  Patient was found to have no therapy needs and did well and recovered completely.  She was discharged home after consultation with vascular surgery will arrange for elective left carotid endarterectomy on 02/13/2018 which went uneventfully.  Patient states she is done well without recurrent stroke or TIA symptoms.  She has had follow-up with Dr. Oneida Alar on 03/09/2018 and he seemed pleased with the results.  She is on aspirin for stroke prevention and tolerating it well without bleeding or bruising.  She is tolerating Lipitor 80 mg well but does complain of knee and joint pains.  She has not had any follow-up lipid profile checked.      ROS:   14 system review of systems is positive for neuropathic pain and all systems negative  PMH:  Past Medical History:  Diagnosis Date  . Coronary artery disease   . GERD (gastroesophageal reflux disease)   . Headache   . History of hiatal hernia   . Hyperlipidemia   . Myocardial infarction ([place]) 12/1999  . Stroke [facility])     Social History:  Social History   Socioeconomic History  . Marital status: Married    Spouse name: Not on file  . Number of children: Not on file  . Years of education: Not on file  . Highest education level: Not on file  Occupational History  . Not on file  Tobacco Use  . Smoking status: Never Smoker  . Smokeless tobacco: Never Used  Vaping Use  . Vaping Use: Never  used  Substance and Sexual Activity  . Alcohol use: No  . Drug use: No  . Sexual activity: Not on file  Other Topics Concern  . Not on file  Social History Narrative  . Not on file   Social Determinants of Health   Financial Resource Strain:   . Difficulty of Paying Living Expenses: Not on file  Food Insecurity:   . Worried About Charity fundraiser in the Last Year: Not on file  . Ran Out of Food in the Last Year: Not on file  Transportation Needs:   . Lack of Transportation (Medical): Not on file  . Lack of Transportation (Non-Medical): Not on file  Physical Activity:   . Days of Exercise per Week: Not on file  . Minutes of Exercise per Session: Not on file  Stress:   . Feeling of Stress : Not on file  Social Connections:   . Frequency of Communication with Friends and Family: Not on file  . Frequency of Social  Gatherings with Friends and Family: Not on file  . Attends Religious Services: Not on file  . Active Member of Clubs or Organizations: Not on file  . Attends Archivist Meetings: Not on file  . Marital Status: Not on file  Intimate Partner Violence:   . Fear of Current or Ex-Partner: Not on file  . Emotionally Abused: Not on file  . Physically Abused: Not on file  . Sexually Abused: Not on file    Medications:   Current Outpatient Medications on File Prior to Visit  Medication Sig Dispense Refill  . aspirin 81 MG chewable tablet Chew 1 tablet (81 mg total) by mouth daily. 30 tablet 0  . gabapentin (NEURONTIN) 600 MG tablet Take 1 tablet (600 mg total) by mouth 3 (three) times daily as needed. For trigeminal neuralgia 90 tablet 3  . levothyroxine (SYNTHROID) 25 MCG tablet Take 1 tablet (25 mcg total) by mouth daily before breakfast. 30 tablet 6  . vitamin C (ASCORBIC ACID) 500 MG tablet Take 500 mg by mouth daily.    . [DISCONTINUED] ezetimibe (ZETIA) 10 MG tablet Take 1 tablet (10 mg total) by mouth daily. 30 tablet 2   No current  facility-administered medications on file prior to visit.    Allergies:   Allergies  Allergen Reactions  . Atorvastatin     Knee pain     Today's Vitals   09/26/19 0929  BP: (!) 172/73  Pulse: 68  Weight: 138 lb (62.6 kg)  Height: 5\' 1"  (1.549 m)   Body mass index is 26.07 kg/m.  Physical Exam General: well developed, well nourished pleasant elderly Caucasian lady, seated, in no evident distress Head: head normocephalic and atraumatic.  Neck: supple with no carotid or supraclavicular bruits.  Cardiovascular: regular rate and rhythm, no murmurs Musculoskeletal: Bilateral hand arthritic nodules Skin:  no rash/petichiae  Neurologic Exam Mental Status: Awake and fully alert.  Normal speech and language.  Oriented to place and time. Recent and remote memory intact. Attention span, concentration and fund of knowledge appropriate. Mood and affect appropriate.  Cranial Nerves: Pupils equal, briskly reactive to light. Extraocular movements full without nystagmus. Visual fields full to confrontation. Hearing intact. Facial sensation intact and subjectively denies pain with light touch. Face, tongue, palate moves normally and symmetrically.  Motor: Normal bulk and tone. Normal strength in all tested extremity muscles. Sensory.: intact to touch ,pinprick .position and vibratory sensation.  Coordination: Rapid alternating movements normal in all extremities. Finger-to-nose and heel-to-shin performed accurately bilaterally. Gait and Station: Arises from chair without difficulty. Stance is normal. Gait demonstrates normal stride length and balance . Able to heel, toe and tandem walk without difficulty.  Reflexes: 1+ and symmetric. Toes downgoing.      ASSESSMENT: 20 year patient with left MCA branch infarct in January 2020 due to symptomatic proximal left carotid stenosis.  Status post interval elective left carotid endarterectomy on 02/13/2018 who is doing clinically quite well with full  recovery.  Vascular risk factors of hyperlipidemia and carotid stenosis with recent diagnosis of DM.  History of right-sided trigeminal neuralgia and left postherpetic neuralgia 2/2 hx of shingles approximately 2 years ago.  Previously managed by Dr. Tomi Likens who requested our office take over management.  She requested todays visit due to acute flareup of trigeminal neuralgia starting on 8/27     PLAN: 1. Trigeminal neuralgia, right:  a. Resolution of flareup upon awakening this morning b. She was started on carbamazepine 100 mg twice daily with taking  initial dose last night and an additional dose this morning.  She denies side effects or difficulty tolerating. c. She also increased gabapentin to 1200 mg 3 times daily over the weekend due to severe pain d. Prior intolerance to oxcarbazepine and baclofen e. Continue carbamazepine 100 mg twice daily and gabapentin 1200 mg 3 times daily over the next couple of days and then slowly start weaning.   f. If remains stable, she will continue gabapentin 600 mg 1-2 tabs 3 times daily as needed for acute flareups and carbamazepine 100 mg twice daily for acute flareups g. Advised to call with any reoccurring symptoms or flareups or intolerance to medications 2. Hx of Left MCA infarct:  a. Continue aspirin 81 mg daily  and Crestor and Zetia for secondary stroke prevention.   b. Ensure close PCP follow-up for aggressive stroke risk factor management including HTN with BP goal<130/90, HLD with LDL goal<70 and DM with A1c goal<7 c. We will provide refill of Crestor and Zetia but advised ongoing refills will need to be obtained by PCP d. Continue to follow with Dr. Oneida Alar s/p L CEA     Follow-up in 6 months or call earlier if needed   I spent 35 minutes of face-to-face and non-face-to-face time with patient.  This included previsit chart review, lab review, study review, order entry, electronic health record documentation, patient education regarding acute  flareup of trigeminal neuralgia, use of medications, history of left MCA stroke and importance of aggressive stroke risk factor management and answered all questions to patient satisfaction   Frann Rider, AGNP-BC  [facility] [address] [place], [place] [address]  Phone [phone] Fax [phone] Note: This document was prepared with digital dictation and possible smart phrase technology. Any transcriptional errors that result from this process are unintentional.

## 2019-10-09 DIAGNOSIS — Z08 Encounter for follow-up examination after completed treatment for malignant neoplasm: Secondary | ICD-10-CM | POA: Diagnosis not present

## 2019-10-09 DIAGNOSIS — Z85828 Personal history of other malignant neoplasm of skin: Secondary | ICD-10-CM | POA: Diagnosis not present

## 2019-11-05 ENCOUNTER — Other Ambulatory Visit: Payer: Self-pay

## 2019-11-05 ENCOUNTER — Telehealth: Payer: Self-pay

## 2019-11-05 ENCOUNTER — Ambulatory Visit (INDEPENDENT_AMBULATORY_CARE_PROVIDER_SITE_OTHER): Payer: Medicare HMO

## 2019-11-05 DIAGNOSIS — E039 Hypothyroidism, unspecified: Secondary | ICD-10-CM

## 2019-11-05 DIAGNOSIS — G500 Trigeminal neuralgia: Secondary | ICD-10-CM

## 2019-11-05 NOTE — Addendum Note (Signed)
Addended by: Hortencia Pilar on: 11/05/2019 05:16 PM   Modules accepted: Orders

## 2019-11-05 NOTE — Telephone Encounter (Signed)
Ordered

## 2019-11-05 NOTE — Telephone Encounter (Signed)
When the patient came in to have her blood drawn today (for thyroid), she said she has an appointment with Dr. Kristeen Miss on 11/14/19 - scheduled without referral. This is for her trigeminal neuralgia, right side of face. The patient asks for an official order for this visit, so that insurance will cover it. Please advise.

## 2019-11-05 NOTE — Progress Notes (Signed)
Thyroid panel + TSH drawn. She has missed 3 days of her thyroid medication.

## 2019-11-06 ENCOUNTER — Telehealth: Payer: Self-pay | Admitting: Family Medicine

## 2019-11-06 LAB — THYROID PANEL WITH TSH
Free Thyroxine Index: 1.7 (ref 1.4–3.8)
T3 Uptake: 28 % (ref 22–35)
T4, Total: 6 ug/dL (ref 5.1–11.9)
TSH: 3.63 mIU/L (ref 0.40–4.50)

## 2019-11-06 NOTE — Telephone Encounter (Signed)
I left a voice mail on the patient's home phone, advising her the referral has been placed.

## 2019-11-06 NOTE — Telephone Encounter (Signed)
Thyroid TSH looks better at 3.63.  If she is feeling okay, we can leave the dosage as is.  If she is struggling with fatigue, we can increase the dosage slightly and then recheck levels in 3 to 4 months.

## 2019-11-06 NOTE — Telephone Encounter (Signed)
In that case, she's probably fine on current dose.

## 2019-11-06 NOTE — Telephone Encounter (Signed)
Left message on home voice mail: continue same dose of thyroid medication - need to recheck in 3-4 months (along with hgb A1C - from prior message in July). Advised her to call me to let me know when she would like to come for this.

## 2019-11-06 NOTE — Telephone Encounter (Signed)
Before I call the patient, I just wanted to let you know that she had run out of her medicine, so she missed 3 doses (including yesterday, when the blood was drawn).

## 2019-11-16 DIAGNOSIS — R030 Elevated blood-pressure reading, without diagnosis of hypertension: Secondary | ICD-10-CM | POA: Diagnosis not present

## 2019-11-16 DIAGNOSIS — G500 Trigeminal neuralgia: Secondary | ICD-10-CM | POA: Diagnosis not present

## 2020-01-07 ENCOUNTER — Ambulatory Visit: Payer: Medicare HMO | Admitting: Adult Health

## 2020-02-29 ENCOUNTER — Ambulatory Visit (INDEPENDENT_AMBULATORY_CARE_PROVIDER_SITE_OTHER): Payer: Medicare HMO

## 2020-02-29 ENCOUNTER — Other Ambulatory Visit: Payer: Self-pay

## 2020-02-29 DIAGNOSIS — E039 Hypothyroidism, unspecified: Secondary | ICD-10-CM

## 2020-02-29 DIAGNOSIS — R739 Hyperglycemia, unspecified: Secondary | ICD-10-CM | POA: Diagnosis not present

## 2020-02-29 NOTE — Progress Notes (Signed)
Lab visit: hgb A1C and thyroid panel + TSH drawn per Dr. Junius Roads.   The patient complained of a full feeling in the area under her right mandible, back near her ear. She said it feels better when she puts a cotton ball in her ear at night. I looked in that ear - full of cerumen. The patient does have a cerumen removal kit at home, with the drops. I advised her to use the drops at night and then showed her how to rinse her ear out while in the shower - letting the warm water fill the ear and then tipping her head to the right side, pulling up and back on her ear to let the water (and hopefully cerumen) run out. Repeat as needed. I also advised her she could go to an urgent care to have them remove the wax (I have nothing with which to do that in this office). I will check on how she is doing when I call her back with the lab results from today.

## 2020-03-01 LAB — HEMOGLOBIN A1C
Hgb A1c MFr Bld: 6.7 % of total Hgb — ABNORMAL HIGH (ref <5.7)
Mean Plasma Glucose: 146 mg/dL
eAG (mmol/L): 8.1 mmol/L

## 2020-03-01 LAB — THYROID PANEL WITH TSH
Free Thyroxine Index: 1.3 — ABNORMAL LOW (ref 1.4–3.8)
T3 Uptake: 28 % (ref 22–35)
T4, Total: 4.8 ug/dL — ABNORMAL LOW (ref 5.1–11.9)
TSH: 7.3 mIU/L — ABNORMAL HIGH (ref 0.40–4.50)

## 2020-03-03 ENCOUNTER — Telehealth: Payer: Self-pay | Admitting: Family Medicine

## 2020-03-03 MED ORDER — LEVOTHYROXINE SODIUM 50 MCG PO TABS
50.0000 ug | ORAL_TABLET | Freq: Every day | ORAL | 3 refills | Status: DC
Start: 2020-03-03 — End: 2023-02-16

## 2020-03-03 NOTE — Telephone Encounter (Signed)
Labs show:  Thyroid looks worse with TSH of 7.3.  Is she taking her medicine daily?  If so, we need to increase her dosage.  A1C is slightly worse at 6.7.  Very important to exercise regularly and to minimize intake of breads; pastas; cereals; sugars and sweets.  Recheck in 3-4 months.

## 2020-03-03 NOTE — Telephone Encounter (Signed)
I called the patient and advised her of her results. She says she did not take her thyroid medication for 2 days (the day the blood was drawn and the day before) because she had a bad headache. She was afraid to take it with her headache medication (gabapentin and one other - she was unsure of the name, but she takes it only when she gets headaches). Would her thyroid medication interact with any other meds? Should she continue on the same strength or change it?

## 2020-03-03 NOTE — Addendum Note (Signed)
Addended by: Hortencia Pilar on: 03/03/2020 05:05 PM   Modules accepted: Orders

## 2020-03-04 NOTE — Telephone Encounter (Signed)
I called and advised the patient of the instructions on her thyroid medication. Repeat lab visit scheduled for 06/06/20 at 4:00.

## 2020-03-10 ENCOUNTER — Encounter (HOSPITAL_COMMUNITY): Payer: Self-pay | Admitting: Emergency Medicine

## 2020-03-10 ENCOUNTER — Other Ambulatory Visit: Payer: Self-pay

## 2020-03-10 ENCOUNTER — Telehealth: Payer: Self-pay | Admitting: Family Medicine

## 2020-03-10 ENCOUNTER — Ambulatory Visit (HOSPITAL_COMMUNITY)
Admission: EM | Admit: 2020-03-10 | Discharge: 2020-03-10 | Disposition: A | Discharge status: Home / Self Care | Payer: Medicare HMO | Attending: Internal Medicine | Admitting: Internal Medicine

## 2020-03-10 DIAGNOSIS — E119 Type 2 diabetes mellitus without complications: Secondary | ICD-10-CM | POA: Diagnosis not present

## 2020-03-10 DIAGNOSIS — G500 Trigeminal neuralgia: Secondary | ICD-10-CM

## 2020-03-10 NOTE — Telephone Encounter (Signed)
I'd suggest going back to one pill daily for the next couple weeks to see if the symptoms go away.  Urgent care to get ear wax removed/irrigated.

## 2020-03-10 NOTE — Discharge Instructions (Signed)
Please call your neurologist office to titrate your medications for neuralgia. Please call them in AM to be evaluated soon. Take carbamazepine for the flare of trigeminal neuralgia

## 2020-03-10 NOTE — ED Triage Notes (Signed)
Pt presents with right ear pain and jaw pain xs 2 weeks. Was told by PCP to come to UC to have ears cleaned out.

## 2020-03-10 NOTE — Telephone Encounter (Signed)
I called and advised the patient. She voiced understanding about going back to just 1 Levothyroxine daily and let us know if she fails to improve. She also said she will go to an urgent care for an irrigation and ear check.

## 2020-03-10 NOTE — Telephone Encounter (Signed)
Pt called and is having complications with her medication. Please give her a call back [phone]

## 2020-03-10 NOTE — ED Provider Notes (Signed)
Tony    CSN: [id number] Arrival date & time: 03/10/20  1816      History   Chief Complaint Chief Complaint  Patient presents with   Otalgia   Jaw Pain    HPI Debra Fry is a 80 y.o. female with a history of trigeminal neuralgia comes to urgent care complaining of worsening facial pain. Pain is on the right side of the face, sharp and associated with some numbness on the right side of the face and the angle of the mouth on the right side. No facial weakness. No known relieving factors. No double vision or blurred vision. Patient is managed with gabapentin and recently started on carbamazepine for trigeminal neuralgia flareups. No fever or chills. No rash on the face. No trauma to the face. 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   Acute CVA (cerebrovascular accident) ([place]) 01/27/2018    Past Surgical History:  Procedure Laterality Date   CARDIAC CATHETERIZATION  2001   results in [place] conversion   CORONARY ARTERY BYPASS GRAFT  2001   ENDARTERECTOMY Left 02/13/2018   Procedure: ENDARTERECTOMY CAROTID;  Surgeon: Elam Dutch, MD;  Location: [place];  Service: Vascular;  Laterality: Left;   OPEN REDUCTION INTERNAL FIXATION (ORIF) DISTAL RADIAL FRACTURE Left 11/23/2018   Procedure: OPEN REDUCTION INTERNAL FIXATION (ORIF) LEFT DISTAL RADIAL AND ULNA  FRACTURES;  Surgeon: [name], MD;  Location: [place];  Service: Orthopedics;  Laterality: Left;    OB History   No obstetric history on  file.      Home Medications    Prior to Admission medications   Medication Sig Start Date End Date Taking? Authorizing Provider  aspirin 81 MG chewable tablet Chew 1 tablet (81 mg total) by mouth daily. 01/30/18   Raiford Noble Latif, DO  carbamazepine (CARBATROL) 100 MG 12 hr capsule Take 1 capsule (100 mg total) by mouth 2 (two) times daily as needed (for acute TN flare up). 09/26/19   Frann Rider, NP  ezetimibe (ZETIA) 10 MG tablet Take 1 tablet (10 mg total) by mouth daily. 09/26/19   Frann Rider, NP  gabapentin (NEURONTIN) 600 MG tablet Take 1 tablet (600 mg total) by mouth 3 (three) times daily as needed. For trigeminal neuralgia 07/02/19   Frann Rider, NP  levothyroxine (SYNTHROID) 50 MCG tablet Take 1 tablet (50 mcg total) by mouth daily before breakfast. 03/03/20   Hilts, Legrand Como, MD  rosuvastatin (CRESTOR) 5 MG tablet Take 1 tablet (5 mg total) by mouth daily. 09/26/19   Frann Rider, NP  vitamin C (ASCORBIC ACID) 500 MG tablet Take 500 mg by mouth daily.    [provider]    Family History Family History  Family history unknown: Yes    Social History Social History   Tobacco Use   Smoking status: Never Smoker   Smokeless tobacco: Never Used  Scientific laboratory technician Use: Never used  Substance Use Topics   Alcohol use: No   Drug use: No  Allergies   Atorvastatin   Review of Systems Review of Systems  HENT: Negative for congestion, ear discharge, ear pain and facial swelling.   Musculoskeletal: Negative.   Neurological: Negative.  Negative for seizures, facial asymmetry, weakness, light-headedness, numbness and headaches.  Psychiatric/Behavioral: Negative.      Physical Exam Triage Vital Signs ED Triage Vitals  Enc Vitals Group     BP 03/10/20 1841 (!) 164/74     Pulse Rate 03/10/20 1841 70     Resp 03/10/20 1841 17     Temp 03/10/20 1841 98.4 F (36.9 C)     Temp Source 03/10/20 1841 Oral     SpO2 03/10/20 1841 96 %     Weight --       Height --      Head Circumference --      Peak Flow --      Pain Score 03/10/20 1840 6     Pain Loc --      Pain Edu? --      Excl. in [place]? --    No data found.  Updated Vital Signs BP (!) 164/74 (BP Location: Left Arm)    Pulse 70    Temp 98.4 F (36.9 C) (Oral)    Resp 17    SpO2 96%   Visual Acuity Right Eye Distance:   Left Eye Distance:   Bilateral Distance:    Right Eye Near:   Left Eye Near:    Bilateral Near:     Physical Exam Vitals and nursing note reviewed.  Constitutional:      General: She is not in acute distress.    Appearance: She is not ill-appearing.  Cardiovascular:     Rate and Rhythm: Normal rate and regular rhythm.  Abdominal:     General: Bowel sounds are normal.     Palpations: Abdomen is soft.  Musculoskeletal:        General: Normal range of motion.  Skin:    General: Skin is warm and dry.  Neurological:     General: No focal deficit present.     Mental Status: She is alert and oriented to person, place, and time. Mental status is at baseline.     Cranial Nerves: No cranial nerve deficit.     Motor: No weakness.     Coordination: Coordination normal.     Gait: Gait normal.  Psychiatric:        Mood and Affect: Mood normal.      UC Treatments / Results  Labs (all labs ordered are listed, but only abnormal results are displayed) Labs Reviewed - No data to display  EKG   Radiology No results found.  Procedures Procedures (including critical care time)  Medications Ordered in UC Medications - No data to display  Initial Impression / Assessment and Plan / UC Course  I have reviewed the triage vital signs and the nursing notes.  Pertinent labs & imaging results that were available during my care of the patient were reviewed by me and considered in my medical decision making (see chart for details).     1. Trigeminal neuralgia on the right side of the face: Patient is advised to continue carbamazepine and gabapentin She is  advised to follow-up with her primary neurologist for medication dosage titration Return precautions given. Final Clinical Impressions(s) / UC Diagnoses   Final diagnoses:  Trigeminal neuralgia of right side of face     Discharge Instructions     Please call your  neurologist office to titrate your medications for neuralgia. Please call them in AM to be evaluated soon. Take carbamazepine for the flare of trigeminal neuralgia   ED Prescriptions    None     PDMP not reviewed this encounter.   Chase Picket, MD 03/11/20 1102

## 2020-03-10 NOTE — Telephone Encounter (Signed)
I called the patient. She says that she has felt "sick" since starting to take 2 of the 25 mcg levothyroxine - "I feel like I can hardly get up and go." She is also complaining of mouth pain on the right, saying it is relieved some when she puts a cotton ball in the right ear. She is unsure if this is part of her trigeminal neuralgia or if it is result of the new dosage of thyroid medication. When she had her blood work check recently, she was having this complaint of her mouth hurting, back toward her right ear. That ear was impacted with cerumen. She has tried to irrigate this at home to no avail. I had suggested to her when she had the blood work that she may want to go to an urgent care to have that ear irrigated, as it may relieve some pressure. She has not done this thus far.   Please advise.

## 2020-03-27 ENCOUNTER — Ambulatory Visit: Payer: Medicare HMO | Admitting: Adult Health

## 2020-05-05 ENCOUNTER — Ambulatory Visit: Payer: Medicare HMO | Admitting: Adult Health

## 2020-05-31 IMAGING — CT CT HEAD W/O CM
3 series · 15 of 47 positions shown, 18 images · non-contrast
Comparison: None

CLINICAL DATA: Right arm weakness starting at 7 a.m.

EXAM:
CT HEAD WITHOUT CONTRAST
TECHNIQUE: Contiguous axial images were obtained from the base of the skull
through the vertex without intravenous contrast.

[Series 3: head 5.0 h30s · axial · 0.42mm/px · z∈[-88,+42]mm · 9 of 32 slices shown, 12 images]
[im 3/32  brain]
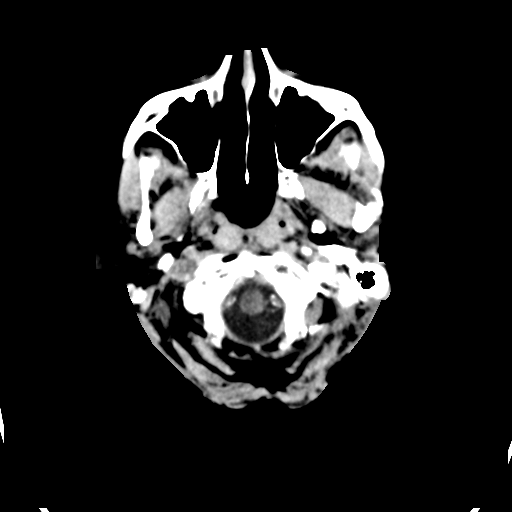
[im 3/32  bone]
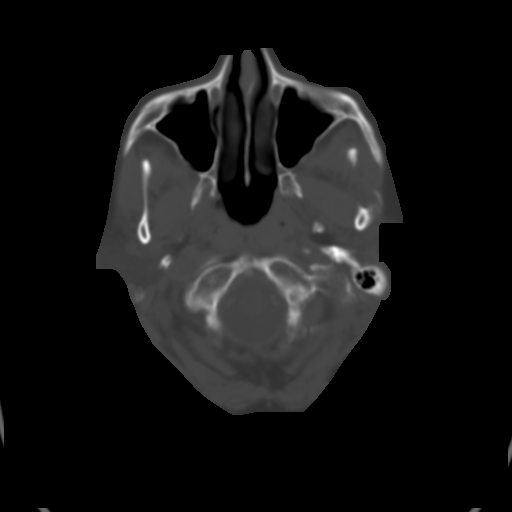
[im 6/32  brain]
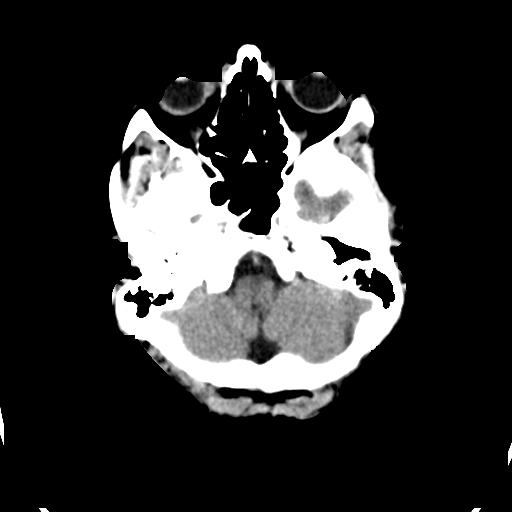
[im 9/32  brain]
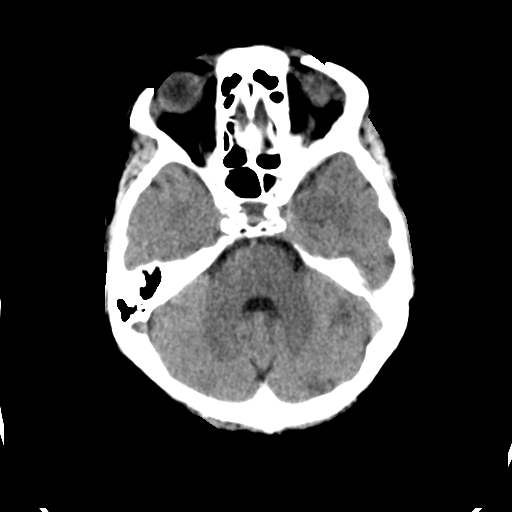
[im 12/32  brain]
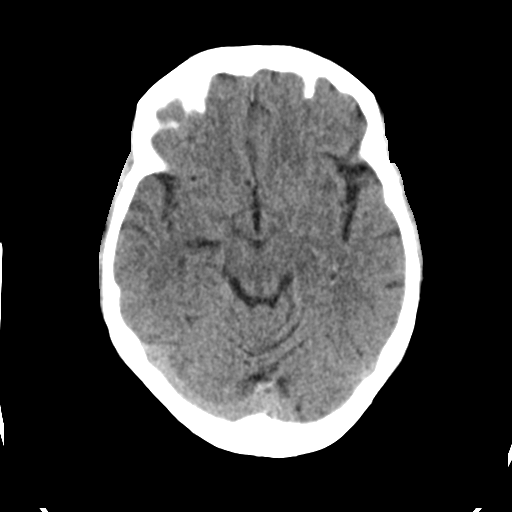
[im 17/32  brain]
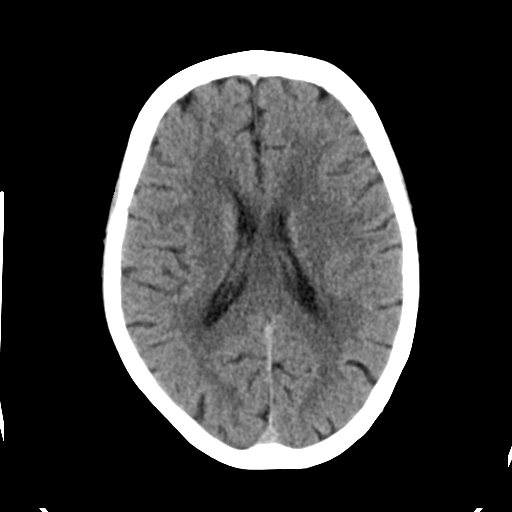
[im 17/32  bone]
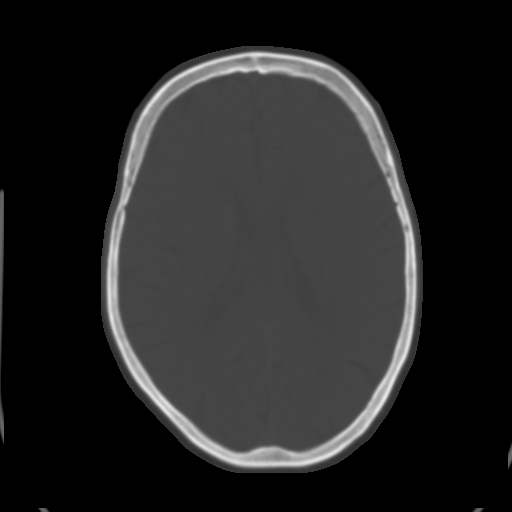
[im 20/32  brain]
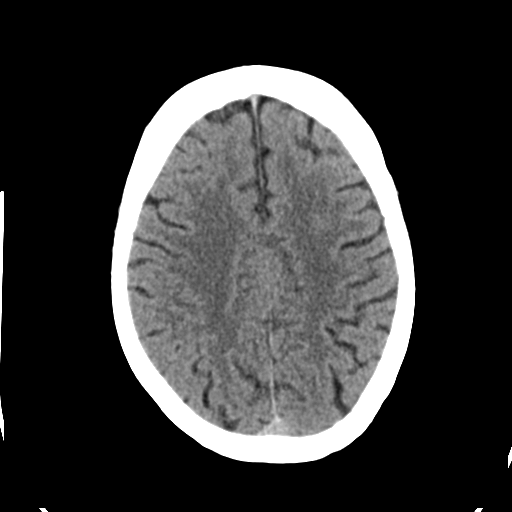
[im 23/32  brain]
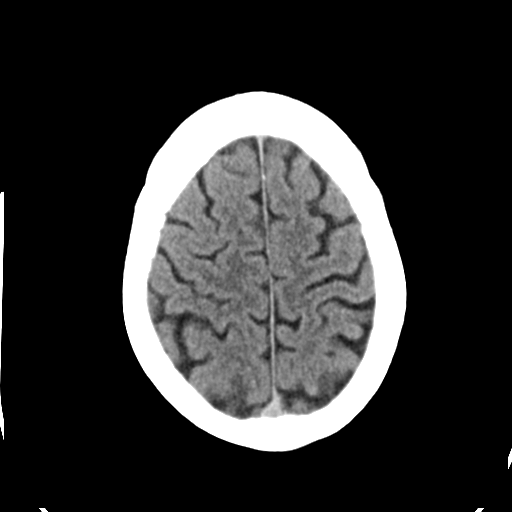
[im 26/32  brain]
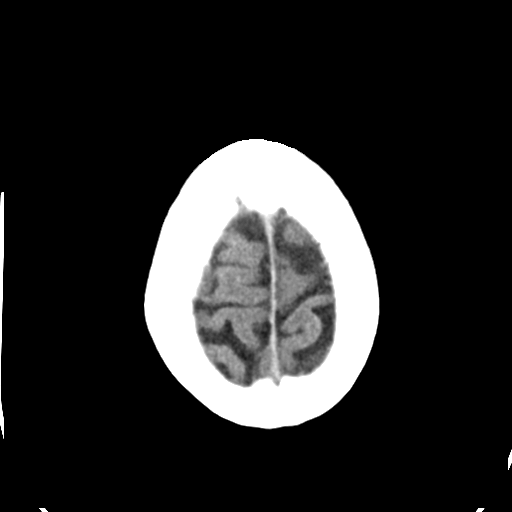
[im 29/32  brain]
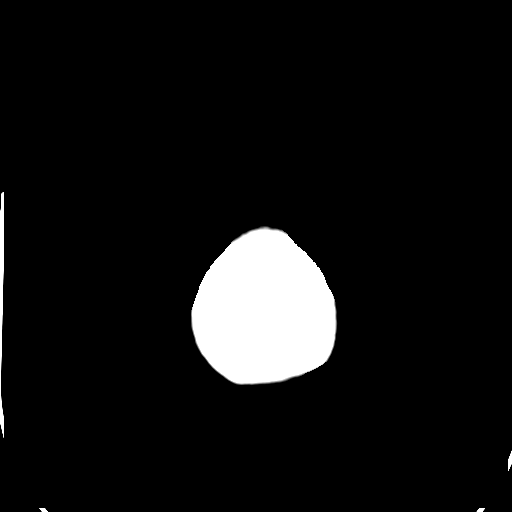
[im 29/32  bone]
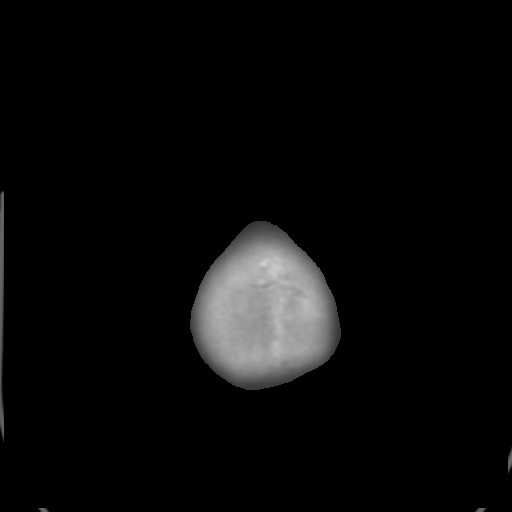

[Series 5: head 3.0 mpr cor · coronal · 0.31mm/px · 3 of 69 slices shown]
[im 23/69  brain]
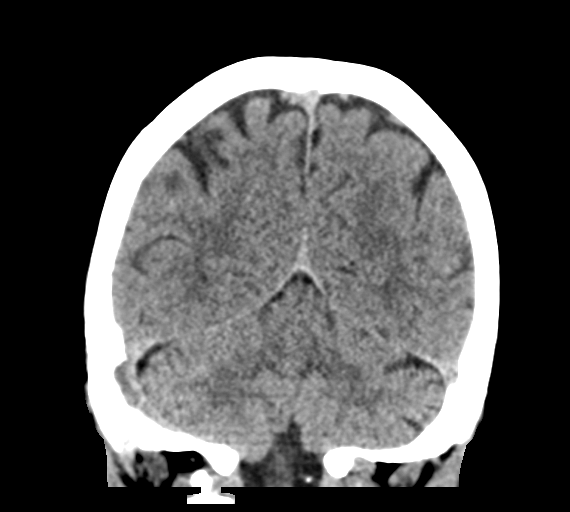
[im 31/69  brain]
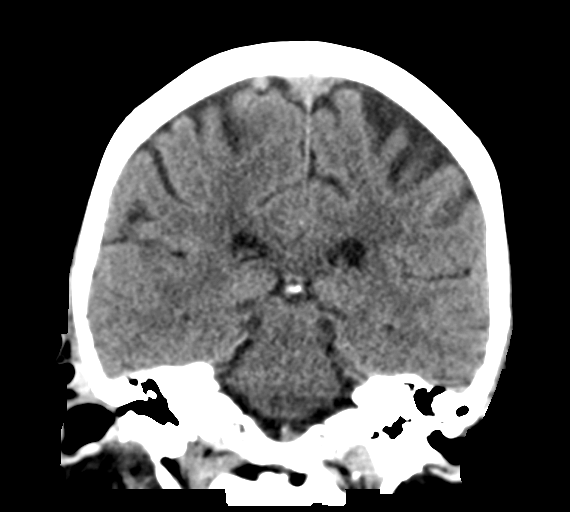
[im 38/69  brain]
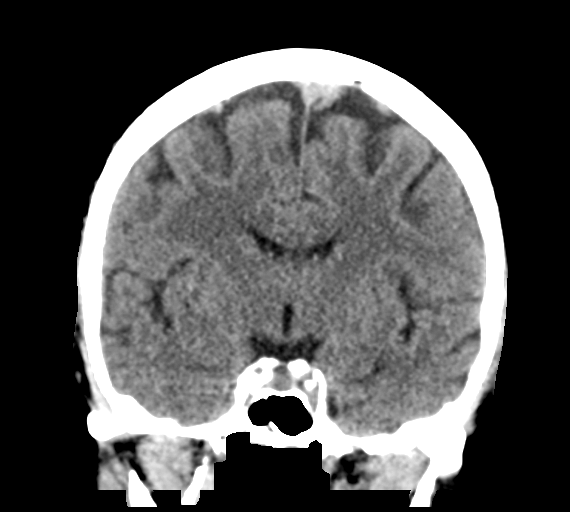

[Series 6: head 3.0 mpr sag · sagittal · 0.31mm/px · 3 of 57 slices shown]
[im 19/57  brain]
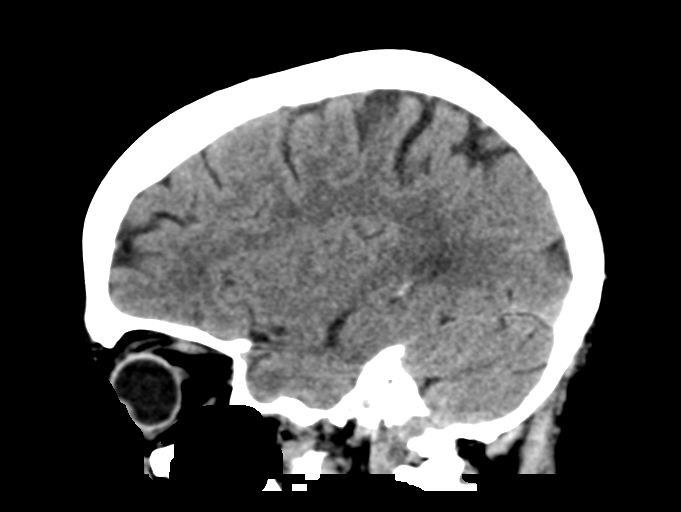
[im 29/57  brain]
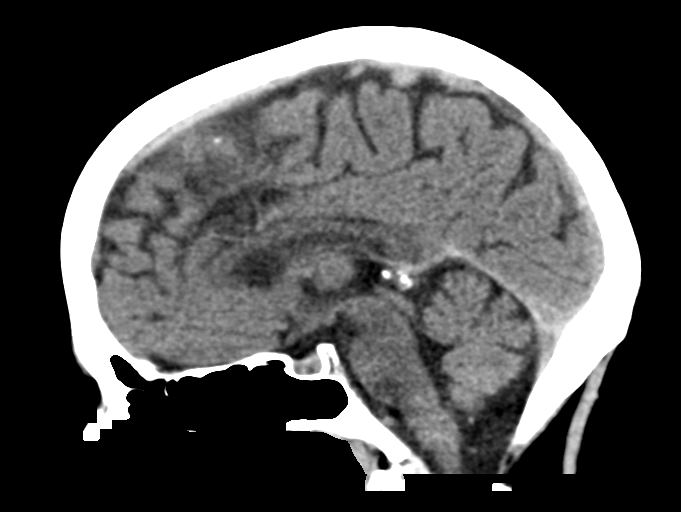
[im 38/57  brain]
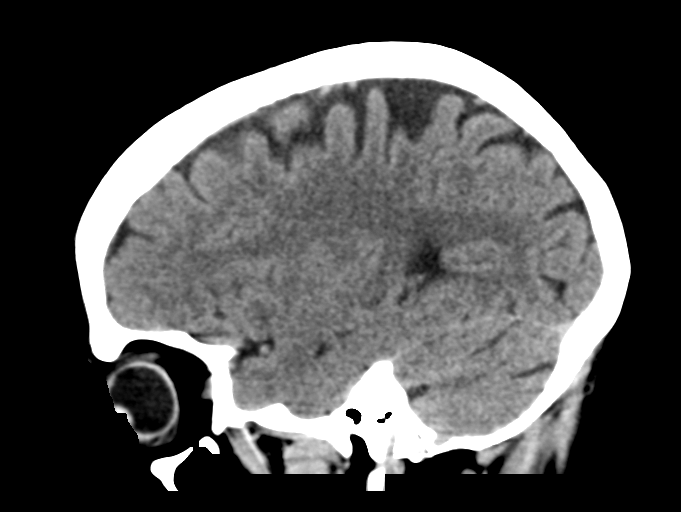

[15 of 47 positions shown; findings below may reference images not displayed]

FINDINGS: Brain: The brainstem, cerebellum, cerebral peduncles, thalami, basal
ganglia, basilar cisterns, and ventricular system appear within
normal limits. No intracranial hemorrhage, mass lesion, or acute
CVA. Mild periventricular white matter hypodensity suggesting faint
chronic microvascular white matter disease.

Vascular: There is atherosclerotic calcification of the cavernous
carotid arteries bilaterally.

Skull: Unremarkable

Sinuses/Orbits: Unremarkable

Other: No supplemental non-categorized findings.
IMPRESSION: 1. No acute intracranial findings.
2. Mild chronic ischemic microvascular white matter disease.
3. Atherosclerosis.

## 2020-05-31 IMAGING — CT CT ANGIO NECK
1 of 8 series · 6 of 33 positions shown · IV contrast (APPLIED)
Comparison: MRI and CT earlier same day.

CLINICAL DATA: Right arm weakness beginning 8188 hours with some
improvement. MRI showed small acute infarctions at the left
frontoparietal junction region.

EXAM:
CT ANGIOGRAPHY HEAD AND NECK
TECHNIQUE: Multidetector CT imaging of the head and neck was performed using
the standard protocol during bolus administration of intravenous
contrast. Multiplanar CT image reconstructions and MIPs were
obtained to evaluate the vascular anatomy. Carotid stenosis
measurements (when applicable) are obtained utilizing NASCET
criteria, using the distal internal carotid diameter as the
denominator.
CONTRAST:  100mL NY5XQV-MTF IOPAMIDOL (NY5XQV-MTF) INJECTION 76%

[Series 7: ax thins · axial · 0.39mm/px · z∈[-270,-20]mm · 6 of 350 slices shown]
[im 50/350  soft-tissue]
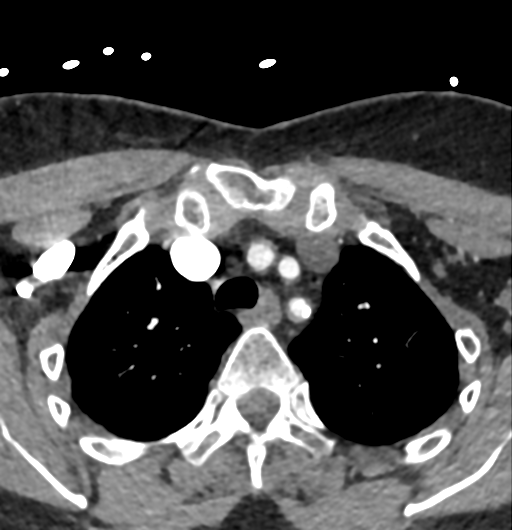
[im 100/350  bone]
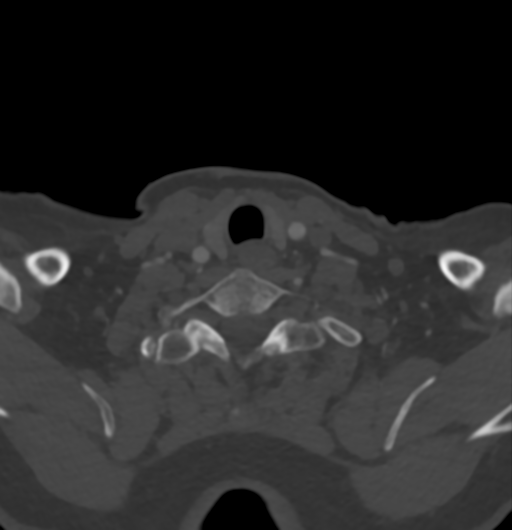
[im 150/350  soft-tissue]
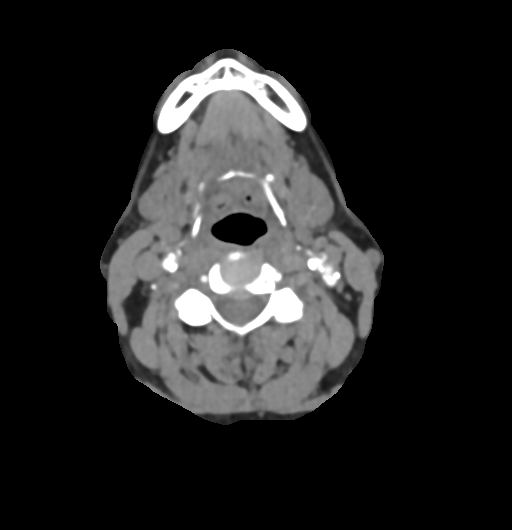
[im 200/350  bone]
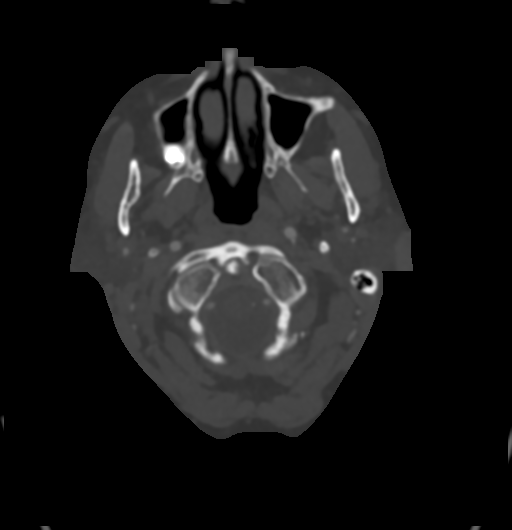
[im 250/350  soft-tissue]
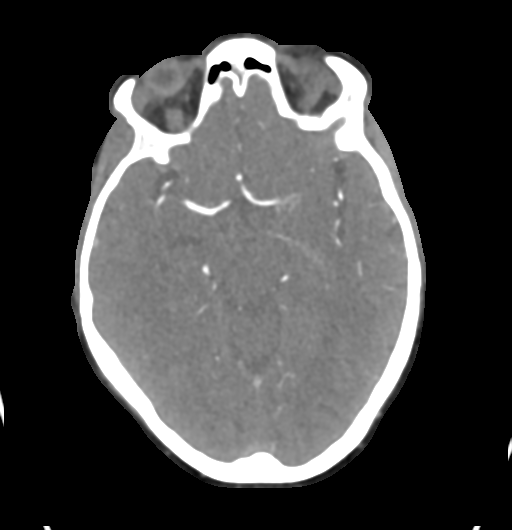
[im 300/350  bone]
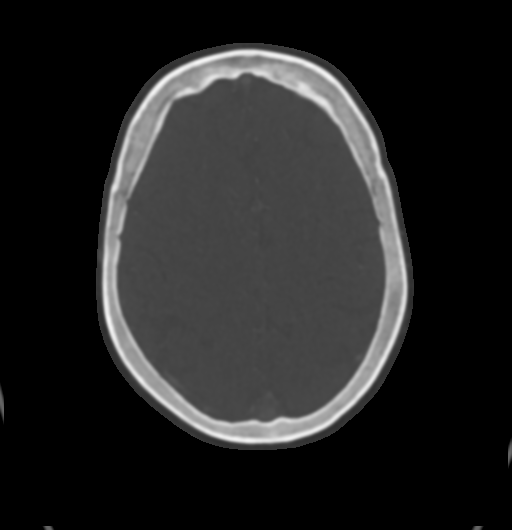

[6 of 33 positions shown; findings below may reference images not displayed]

FINDINGS: CTA NECK FINDINGS

Aortic arch: Aortic atherosclerosis. No aneurysm. Atherosclerotic
change at the brachiocephalic vessel origins but without flow
limiting stenosis. 40% stenosis of the left subclavian origin.

Right carotid system: Common carotid artery widely patent to the
bifurcation region. Atherosclerotic disease at the bifurcation and
ICA bulb. Minimal diameter of the proximal ICA is 2.5 mm. Compared
to a more distal cervical ICA diameter of 4 mm, this indicates a 40%
stenosis. ICA widely patent beyond that.

Left carotid system: Left common carotid artery widely patent to the
bifurcation region. There is advanced and complex atherosclerotic
disease at the carotid bifurcation and ICA bulb region. There is
marked irregularity which could be a source of emboli. Minimal
diameter is difficult to measure accurately but is probably 1 mm.
Compared to a more distal cervical ICA diameter of 4.5 mm, this
indicates a 75% stenosis. Beyond that, the cervical ICA is widely
patent.

Vertebral arteries: The left vertebral artery is dominant. Left
vertebral artery origin is widely patent in the vessel is widely
patent through the cervical region to the foramen magnum. The right
vertebral artery origin is stenotic with narrowing of 70% or
greater. Beyond that, the vessel is patent through the cervical
region to the foramen magnum.

Skeleton: Mild cervical spondylosis.

Other neck: No mass or lymphadenopathy.

Upper chest: Mild apical scarring.  No infiltrate or mass.

Review of the MIP images confirms the above findings

CTA HEAD FINDINGS

Anterior circulation: Both internal carotid arteries are patent
through the skull base. There is siphon atherosclerosis but without
flow limiting stenosis. The anterior and middle cerebral vessels are
patent without proximal stenosis, aneurysm or vascular malformation.
No large or medium vessel occlusion is identified.

Posterior circulation: Both vertebral arteries are patent through
the foramen magnum to the basilar. No basilar stenosis. Posterior
circulation branch vessels are patent. Posterior communicating
arteries receive most of there supply from the anterior circulation.

Venous sinuses: Patent and normal.

Anatomic variants: None significant.

Delayed phase: No abnormal enhancement.

Review of the MIP images confirms the above findings
IMPRESSION: In this patient with acute infarction at the left frontoparietal
junction, the dominant finding is that of advanced and irregular
atherosclerotic disease at the left carotid bifurcation and ICA
bulb. Minimal diameter in the ICA bulb is approximately 1 mm,
consistent with a 75% stenosis. The pronounced irregularity could
serve as a source of embolic disease. There is no large or medium
vessel intracranial occlusion identifiable at this time.

Atherosclerotic disease at the right carotid bifurcation with
stenosis of the ICA of 40%.

70% right vertebral artery origin stenosis. That vessel is non
dominant.

## 2020-06-06 ENCOUNTER — Other Ambulatory Visit: Payer: Self-pay

## 2020-06-06 ENCOUNTER — Ambulatory Visit: Payer: Medicare HMO

## 2020-06-06 DIAGNOSIS — E119 Type 2 diabetes mellitus without complications: Secondary | ICD-10-CM

## 2020-06-06 NOTE — Progress Notes (Signed)
Lab visit only: the patient is here for thyroid panel with TSH and Hgb A1C recheck. However, she has not been taking her thyroid medication because it makes her feel bad. She mentioned that she has been taking otc potassium for leg cramps this week - unsure of strength of the potassium. So, per Dr. Junius Roads, testing Hgb A1C and a BMET instead of a thyroid panel.

## 2020-06-07 LAB — BASIC METABOLIC PANEL
BUN: 15 mg/dL (ref 7–25)
CO2: 26 mmol/L (ref 20–32)
Calcium: 9.5 mg/dL (ref 8.6–10.4)
Chloride: 103 mmol/L (ref 98–110)
Creat: 0.79 mg/dL (ref 0.60–0.93)
Glucose, Bld: 125 mg/dL — ABNORMAL HIGH (ref 65–99)
Potassium: 4.1 mmol/L (ref 3.5–5.3)
Sodium: 138 mmol/L (ref 135–146)

## 2020-06-07 LAB — HEMOGLOBIN A1C
Hgb A1c MFr Bld: 6.5 % of total Hgb — ABNORMAL HIGH (ref <5.7)
Mean Plasma Glucose: 140 mg/dL
eAG (mmol/L): 7.7 mmol/L

## 2020-06-09 ENCOUNTER — Telehealth: Payer: Self-pay | Admitting: Family Medicine

## 2020-06-09 NOTE — Telephone Encounter (Signed)
Patient aware of results.

## 2020-06-09 NOTE — Telephone Encounter (Signed)
Glucose is 125 and A1C is 6.5.  Doing much better!  Recheck in 3-4 months.

## 2020-07-02 ENCOUNTER — Other Ambulatory Visit: Payer: Self-pay | Admitting: Family Medicine

## 2020-09-10 ENCOUNTER — Telehealth: Payer: Self-pay | Admitting: Family Medicine

## 2020-09-10 NOTE — Telephone Encounter (Signed)
Urine specimen has been prepared for UA/UC - sent to [facility] (original order was from 09/05/20 at his ov -- could not provide specimen at that time).

## 2020-09-10 NOTE — Telephone Encounter (Signed)
Pt is going to\ bring in a urine sample this afternoon.   CB [id number]

## 2020-09-15 ENCOUNTER — Encounter: Payer: Self-pay | Admitting: Family Medicine

## 2020-09-15 ENCOUNTER — Other Ambulatory Visit: Payer: Self-pay

## 2020-09-15 ENCOUNTER — Ambulatory Visit (INDEPENDENT_AMBULATORY_CARE_PROVIDER_SITE_OTHER): Payer: Medicare HMO | Admitting: Family Medicine

## 2020-09-15 VITALS — BP 135/68 | HR 76 | Wt 137.8 lb

## 2020-09-15 DIAGNOSIS — R8290 Unspecified abnormal findings in urine: Secondary | ICD-10-CM | POA: Diagnosis not present

## 2020-09-15 DIAGNOSIS — R5381 Other malaise: Secondary | ICD-10-CM

## 2020-09-15 DIAGNOSIS — M25512 Pain in left shoulder: Secondary | ICD-10-CM

## 2020-09-15 DIAGNOSIS — R0981 Nasal congestion: Secondary | ICD-10-CM | POA: Diagnosis not present

## 2020-09-15 MED ORDER — GABAPENTIN 600 MG PO TABS
600.0000 mg | ORAL_TABLET | Freq: Three times a day (TID) | ORAL | 3 refills | Status: DC | PRN
Start: 2020-09-15 — End: 2022-05-25

## 2020-09-15 MED ORDER — CARBAMAZEPINE ER 100 MG PO CP12
100.0000 mg | ORAL_CAPSULE | Freq: Two times a day (BID) | ORAL | 3 refills | Status: DC | PRN
Start: 2020-09-15 — End: 2023-02-16

## 2020-09-15 MED ORDER — AMOXICILLIN-POT CLAVULANATE 875-125 MG PO TABS: 1.0000 | ORAL_TABLET | Freq: Two times a day (BID) | ORAL | 0 refills | Status: DC

## 2020-09-15 MED ORDER — OMEPRAZOLE 20 MG PO CPDR
DELAYED_RELEASE_CAPSULE | ORAL | 1 refills | Status: DC
Start: 2020-09-15 — End: 2023-01-23

## 2020-09-15 NOTE — Progress Notes (Signed)
Office Visit Note   Patient: Debra Fry           Date of Birth: 04-Apr-1940           MRN: [id number] Visit Date: 09/15/2020 Requested by: Eunice Blase, MD [place],  [place] [id number] PCP: Eunice Blase, MD  Subjective: Chief Complaint  Patient presents with   Follow-up    On labs that have been done. States that her urine really stinks.     HPI: She is here with abnormal urine odor.  She has had this for several days.  She feels tired, just does not feel well.  No definite fevers, no burning with urination.  She has never had this issue before.  She also struggles with head congestion.  This has been happening off and on for a while.  No purulent discharge.  She has left shoulder blade pain in the past week or so, with radiation into the left arm.                ROS:   All other systems were reviewed and are negative.  Objective: Vital Signs: BP 135/68 (BP Location: Left Arm, Patient Position: Sitting)   Pulse 76   Wt 137 lb 12.8 oz (62.5 kg)   BMI 26.04 kg/m   Physical Exam:  General:  Alert and oriented, in no acute distress. Pulm:  Breathing unlabored. Psy:  Normal mood, congruent affect  Neck: No lymphadenopathy. CV: Regular rate and rhythm without murmurs, rubs, or gallops.  No peripheral edema.  2+ radial and posterior tibial pulses. Lungs: Clear to auscultation throughout with no wheezing or areas of consolidation. Back: No CVA tenderness.  She has a tender trigger point in the left rhomboid area that reproduces her shoulder and arm pain.     Imaging: No results found.  Assessment & Plan: Abnormal urine odor with malaise, concerning for UTI -Urinalysis, Augmentin.  2.  Myofascial left shoulder pain -Deep tissue massage using a tennis ball.  3.  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   Acute CVA (cerebrovascular accident) ([place]) 01/27/2018   Past Medical History:  Diagnosis Date   Coronary artery disease    GERD (gastroesophageal reflux disease)    Headache    History of hiatal hernia    Hyperlipidemia    Myocardial infarction ([place]) 12/1999   Stroke [facility])     Family History  Family history unknown: Yes    Past Surgical History:  Procedure Laterality Date   CARDIAC CATHETERIZATION  2001   results in [place] conversion   CORONARY ARTERY BYPASS GRAFT  2001   ENDARTERECTOMY Left 02/13/2018   Procedure: ENDARTERECTOMY CAROTID;  Surgeon: Elam Dutch, MD;  Location: [place];  Service: Vascular;  Laterality: Left;   OPEN REDUCTION INTERNAL FIXATION (ORIF) DISTAL RADIAL FRACTURE Left 11/23/2018   Procedure: OPEN REDUCTION INTERNAL FIXATION (ORIF) LEFT DISTAL RADIAL AND ULNA  FRACTURES;  Surgeon: [name], MD;  Location: [place];  Service: Orthopedics;  Laterality: Left;   Social History   Occupational History   Not on file  Tobacco Use   Smoking status: Never  Smokeless tobacco: Never  Vaping Use   Vaping Use: Never used  Substance and Sexual Activity   Alcohol use: No   Drug use: No   Sexual activity: Not on file

## 2020-09-18 LAB — URINALYSIS W MICROSCOPIC + REFLEX CULTURE
Bilirubin Urine: NEGATIVE
Glucose, UA: NEGATIVE
Hgb urine dipstick: NEGATIVE
Hyaline Cast: NONE SEEN /LPF
Ketones, ur: NEGATIVE
Nitrites, Initial: NEGATIVE
Protein, ur: NEGATIVE
RBC / HPF: NONE SEEN /HPF (ref 0–2)
Specific Gravity, Urine: 1.014 (ref 1.001–1.035)
pH: 5.5 (ref 5.0–8.0)

## 2020-09-18 LAB — CULTURE INDICATED

## 2020-09-18 LAB — URINE CULTURE
MICRO NUMBER:: 12277652
SPECIMEN QUALITY:: ADEQUATE

## 2020-09-19 ENCOUNTER — Telehealth: Payer: Self-pay | Admitting: Family Medicine

## 2020-09-19 NOTE — Telephone Encounter (Signed)
I called the patient -- see the original message with lab results.

## 2020-09-19 NOTE — Telephone Encounter (Signed)
I called and advised the patient. 

## 2020-09-19 NOTE — Telephone Encounter (Signed)
I called and left voice message to call me back.

## 2020-09-19 NOTE — Telephone Encounter (Signed)
Advised the patient of her lab results and instructions. She voiced understanding.   She forgot to ask about leg cramps at her ov on Monday. She wakes up at night with cramping in her legs and feet. She has to get up and walk around to get them to calm down. She has tried taking some mustard at night, but it is not helping. Please advise on what she can do for these.

## 2020-09-19 NOTE — Telephone Encounter (Signed)
Pt called terri back I let her know to continue taking her antibiotics but she would like to still talk to terri.

## 2020-09-19 NOTE — Telephone Encounter (Signed)
Labs show:  Urine looks infected.  Bacteria should respond to augmentin.  Finish antibiotics and let us know if symptoms persist.

## 2021-06-11 DIAGNOSIS — M1009 Idiopathic gout, multiple sites: Secondary | ICD-10-CM | POA: Diagnosis not present

## 2021-06-11 DIAGNOSIS — R030 Elevated blood-pressure reading, without diagnosis of hypertension: Secondary | ICD-10-CM | POA: Insufficient documentation

## 2021-07-08 DIAGNOSIS — M109 Gout, unspecified: Secondary | ICD-10-CM | POA: Diagnosis not present

## 2021-07-08 DIAGNOSIS — M179 Osteoarthritis of knee, unspecified: Secondary | ICD-10-CM | POA: Diagnosis not present

## 2021-07-08 DIAGNOSIS — F4322 Adjustment disorder with anxiety: Secondary | ICD-10-CM | POA: Insufficient documentation

## 2021-07-08 DIAGNOSIS — M5481 Occipital neuralgia: Secondary | ICD-10-CM | POA: Diagnosis not present

## 2021-07-30 DIAGNOSIS — R2242 Localized swelling, mass and lump, left lower limb: Secondary | ICD-10-CM | POA: Diagnosis not present

## 2021-07-30 DIAGNOSIS — M5481 Occipital neuralgia: Secondary | ICD-10-CM | POA: Diagnosis not present

## 2021-07-30 DIAGNOSIS — M25562 Pain in left knee: Secondary | ICD-10-CM | POA: Diagnosis not present

## 2021-08-05 DIAGNOSIS — M25562 Pain in left knee: Secondary | ICD-10-CM | POA: Diagnosis not present

## 2021-08-07 ENCOUNTER — Other Ambulatory Visit: Payer: Self-pay | Admitting: *Deleted

## 2021-08-07 DIAGNOSIS — M7989 Other specified soft tissue disorders: Secondary | ICD-10-CM

## 2021-08-14 ENCOUNTER — Ambulatory Visit
Admission: RE | Admit: 2021-08-14 | Discharge: 2021-08-14 | Disposition: A | Discharge status: Home / Self Care | Payer: Medicare HMO | Source: Ambulatory Visit | Attending: *Deleted | Admitting: *Deleted

## 2021-08-14 DIAGNOSIS — M7989 Other specified soft tissue disorders: Secondary | ICD-10-CM | POA: Diagnosis not present

## 2021-09-11 DIAGNOSIS — E559 Vitamin D deficiency, unspecified: Secondary | ICD-10-CM | POA: Diagnosis present

## 2021-09-11 DIAGNOSIS — Z13220 Encounter for screening for lipoid disorders: Secondary | ICD-10-CM | POA: Diagnosis not present

## 2021-09-11 DIAGNOSIS — Z131 Encounter for screening for diabetes mellitus: Secondary | ICD-10-CM | POA: Diagnosis not present

## 2021-09-11 DIAGNOSIS — N390 Urinary tract infection, site not specified: Secondary | ICD-10-CM | POA: Diagnosis not present

## 2021-09-11 DIAGNOSIS — Z79899 Other long term (current) drug therapy: Secondary | ICD-10-CM | POA: Diagnosis not present

## 2021-09-11 DIAGNOSIS — F4321 Adjustment disorder with depressed mood: Secondary | ICD-10-CM | POA: Diagnosis not present

## 2021-09-21 DIAGNOSIS — N390 Urinary tract infection, site not specified: Secondary | ICD-10-CM | POA: Diagnosis not present

## 2022-05-25 ENCOUNTER — Ambulatory Visit (HOSPITAL_COMMUNITY)
Admission: EM | Admit: 2022-05-25 | Discharge: 2022-05-25 | Disposition: A | Discharge status: Home / Self Care | Payer: Medicare HMO | Attending: Family Medicine | Admitting: Family Medicine

## 2022-05-25 ENCOUNTER — Encounter (HOSPITAL_COMMUNITY): Payer: Self-pay

## 2022-05-25 DIAGNOSIS — G500 Trigeminal neuralgia: Secondary | ICD-10-CM

## 2022-05-25 MED ORDER — GABAPENTIN 300 MG PO CAPS: 300.0000 mg | ORAL_CAPSULE | Freq: Three times a day (TID) | ORAL | 0 refills | Status: DC

## 2022-05-25 MED ORDER — KETOROLAC TROMETHAMINE 30 MG/ML IJ SOLN: 30.0000 mg | Freq: Once | INTRAMUSCULAR | Status: DC

## 2022-05-25 NOTE — ED Triage Notes (Signed)
Here for R-side facial pain and headache. Pt stated she has history of Fibromyalgia. Pt is requesting refill on Gabapentin.

## 2022-05-25 NOTE — Discharge Instructions (Signed)
You were seen today for trigeminal neuralgia.   I have refilled the gabapentin 300mg  three times/day.  I have placed a referral to your neurologist, Ihor Austin.  You may call for an appointment at [phone].

## 2022-05-25 NOTE — ED Provider Notes (Addendum)
[facility]    CSN: [id number] Arrival date & time: 05/25/22  1245      History   Chief Complaint Chief Complaint  Patient presents with   Facial Pain   Headache    HPI Debra Fry is a 82 y.o. female.   Patient is here for pain at the right lower jaw area. This started about 3 days ago.  Pain into the right ear as well.  Brushing her teeth actually helps with the pain.  Last night she had pain at the top of the head, pain at the left upper head.  She had pain this morning as well, but that has resolved.  She still has pain into her right jaw area.  She has a partial, but no teeth there.  She has had this pain in the past before.  Has been to the ER in the past.  She has a history of neuralgia, and this seems very similar to that.  Was seen 2/22, dx with trigeminal neuralgia.  In the past she has seen neuro.  She was gabapentin and carbamazepine.  She conts to take the carbamazepine, and no longer  has the gabapenitn.  She was given a different dosage that did not work as well. She is requesting the yellow capsules.  The tablets made her sick.       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   Acute CVA (cerebrovascular accident) (HCC) 01/27/2018    Past Surgical History:  Procedure Laterality Date   CARDIAC CATHETERIZATION  2001   results in Echart conversion   CORONARY ARTERY BYPASS GRAFT  2001   ENDARTERECTOMY Left 02/13/2018   Procedure: ENDARTERECTOMY CAROTID;  Surgeon: Sherren Kerns, MD;  Location: [facility] OR;  Service: Vascular;   Laterality: Left;   OPEN REDUCTION INTERNAL FIXATION (ORIF) DISTAL RADIAL FRACTURE Left 11/23/2018   Procedure: OPEN REDUCTION INTERNAL FIXATION (ORIF) LEFT DISTAL RADIAL AND ULNA  FRACTURES;  Surgeon: Betha Loa, MD;  Location: [facility];  Service: Orthopedics;  Laterality: Left;    OB History   No obstetric history on file.      Home Medications    Prior to Admission medications   Medication Sig Start Date End Date Taking? Authorizing Provider  carbamazepine (CARBATROL) 100 MG 12 hr capsule Take 1 capsule (100 mg total) by mouth 2 (two) times daily as needed (for acute TN flare up). 09/15/20  Yes Hilts, Casimiro Needle, MD  gabapentin (NEURONTIN) 600 MG tablet Take 1 tablet (600 mg total) by mouth 3 (three) times daily as needed. For trigeminal neuralgia 09/15/20  Yes Hilts, Casimiro Needle, MD  amoxicillin-clavulanate (AUGMENTIN) 875-125 MG tablet Take 1 tablet by mouth 2 (two) times daily. 09/15/20   Hilts, Casimiro Needle, MD  aspirin 81 MG chewable tablet Chew 1 tablet (81 mg total) by mouth daily. 01/30/18   Marguerita Merles Latif, DO  ezetimibe (ZETIA) 10 MG tablet Take 1 tablet (10 mg total) by mouth daily. 09/26/19   Ihor Austin, NP  levothyroxine (SYNTHROID) 50 MCG tablet  Take 1 tablet (50 mcg total) by mouth daily before breakfast. 03/03/20   Hilts, Casimiro Needle, MD  omeprazole (PRILOSEC) 20 MG capsule TAKE 1 CAPSULE(20 MG) BY MOUTH DAILY AS NEEDED 09/15/20   Hilts, Casimiro Needle, MD  rosuvastatin (CRESTOR) 5 MG tablet Take 1 tablet (5 mg total) by mouth daily. 09/26/19   Ihor Austin, NP  vitamin C (ASCORBIC ACID) 500 MG tablet Take 500 mg by mouth daily.    [provider]    Family History Family History  Family history unknown: Yes    Social History Social History   Tobacco Use   Smoking status: Never   Smokeless tobacco: Never  Vaping Use   Vaping Use: Never used  Substance Use Topics   Alcohol use: No   Drug use: No     Allergies   Atorvastatin   Review of  Systems Review of Systems  Constitutional: Negative.   HENT: Negative.    Respiratory: Negative.    Cardiovascular: Negative.   Gastrointestinal: Negative.   Neurological:  Positive for headaches.     Physical Exam Triage Vital Signs ED Triage Vitals [05/25/22 1328]  Enc Vitals Group     BP 138/75     Pulse Rate 69     Resp 16     Temp 98.6 F (37 C)     Temp Source Oral     SpO2 94 %     Weight      Height      Head Circumference      Peak Flow      Pain Score      Pain Loc      Pain Edu?      Excl. in GC?    No data found.  Updated Vital Signs BP 138/75 (BP Location: Left Arm)   Pulse 69   Temp 98.6 F (37 C) (Oral)   Resp 16   SpO2 94%   Visual Acuity Right Eye Distance:   Left Eye Distance:   Bilateral Distance:    Right Eye Near:   Left Eye Near:    Bilateral Near:     Physical Exam Constitutional:      General: She is not in acute distress.    Appearance: She is well-developed and normal weight. She is not ill-appearing.  HENT:     Head: Normocephalic and atraumatic.     Mouth/Throat:     Mouth: Mucous membranes are moist.  Eyes:     Extraocular Movements: Extraocular movements intact.     Pupils: Pupils are equal, round, and reactive to light.  Cardiovascular:     Rate and Rhythm: Normal rate and regular rhythm.  Pulmonary:     Effort: Pulmonary effort is normal.     Breath sounds: Normal breath sounds.  Musculoskeletal:     Cervical back: Normal range of motion and neck supple.  Neurological:     Mental Status: She is alert and oriented to person, place, and time.     Cranial Nerves: No cranial nerve deficit or facial asymmetry.     Sensory: No sensory deficit.     Motor: No weakness.  Psychiatric:        Mood and Affect: Mood normal.        Behavior: Behavior normal.      UC Treatments / Results  Labs (all labs ordered are listed, but only abnormal results are displayed) Labs Reviewed - No data to  display  EKG   Radiology No results found.  Procedures Procedures (including critical care time)  Medications Ordered in UC Medications - No data to display   Initial Impression / Assessment and Plan / UC Course  I have reviewed the triage vital signs and the nursing notes.  Pertinent labs & imaging results that were available during my care of the patient were reviewed by me and considered in my medical decision making (see chart for details).   Final Clinical Impressions(s) / UC Diagnoses   Final diagnoses:  Trigeminal neuralgia     Discharge Instructions      You were seen today for trigeminal neuralgia.   I have refilled the gabapentin 300mg  three times/day.  I have placed a referral to your neurologist, Ihor Austin.  You may call for an appointment at [phone].      ED Prescriptions     Medication Sig Dispense Auth. Provider   gabapentin (NEURONTIN) 300 MG capsule Take 1 capsule (300 mg total) by mouth 3 (three) times daily. 90 capsule Jannifer Franklin, MD      PDMP not reviewed this encounter.   Jannifer Franklin, MD 05/25/22 1356    Jannifer Franklin, MD 05/25/22 1406

## 2022-05-25 NOTE — ED Notes (Addendum)
Called patient- pt did not received her Toradol injection. No answer or voice mail to leave a message.

## 2023-01-21 ENCOUNTER — Encounter (HOSPITAL_BASED_OUTPATIENT_CLINIC_OR_DEPARTMENT_OTHER): Payer: Self-pay | Admitting: Emergency Medicine

## 2023-01-21 ENCOUNTER — Emergency Department (HOSPITAL_BASED_OUTPATIENT_CLINIC_OR_DEPARTMENT_OTHER): Payer: Medicare HMO

## 2023-01-21 ENCOUNTER — Inpatient Hospital Stay (HOSPITAL_BASED_OUTPATIENT_CLINIC_OR_DEPARTMENT_OTHER)
Admission: EM | Admit: 2023-01-21 | Discharge: 2023-01-23 | DRG: 812 | Disposition: A | Discharge status: Home / Self Care | Payer: Medicare HMO | Source: Ambulatory Visit | Attending: Internal Medicine | Admitting: Internal Medicine

## 2023-01-21 ENCOUNTER — Other Ambulatory Visit: Payer: Self-pay

## 2023-01-21 DIAGNOSIS — Z8673 Personal history of transient ischemic attack (TIA), and cerebral infarction without residual deficits: Secondary | ICD-10-CM

## 2023-01-21 DIAGNOSIS — K449 Diaphragmatic hernia without obstruction or gangrene: Secondary | ICD-10-CM | POA: Diagnosis present

## 2023-01-21 DIAGNOSIS — Z79899 Other long term (current) drug therapy: Secondary | ICD-10-CM | POA: Diagnosis not present

## 2023-01-21 DIAGNOSIS — I252 Old myocardial infarction: Secondary | ICD-10-CM | POA: Diagnosis not present

## 2023-01-21 DIAGNOSIS — K2900 Acute gastritis without bleeding: Secondary | ICD-10-CM | POA: Diagnosis present

## 2023-01-21 DIAGNOSIS — E039 Hypothyroidism, unspecified: Secondary | ICD-10-CM | POA: Diagnosis present

## 2023-01-21 DIAGNOSIS — M109 Gout, unspecified: Secondary | ICD-10-CM | POA: Diagnosis present

## 2023-01-21 DIAGNOSIS — E538 Deficiency of other specified B group vitamins: Secondary | ICD-10-CM | POA: Diagnosis present

## 2023-01-21 DIAGNOSIS — E7849 Other hyperlipidemia: Secondary | ICD-10-CM | POA: Diagnosis present

## 2023-01-21 DIAGNOSIS — Z888 Allergy status to other drugs, medicaments and biological substances status: Secondary | ICD-10-CM

## 2023-01-21 DIAGNOSIS — K219 Gastro-esophageal reflux disease without esophagitis: Secondary | ICD-10-CM | POA: Diagnosis present

## 2023-01-21 DIAGNOSIS — Z7982 Long term (current) use of aspirin: Secondary | ICD-10-CM | POA: Diagnosis not present

## 2023-01-21 DIAGNOSIS — Z8711 Personal history of peptic ulcer disease: Secondary | ICD-10-CM

## 2023-01-21 DIAGNOSIS — D62 Acute posthemorrhagic anemia: Principal | ICD-10-CM | POA: Diagnosis present

## 2023-01-21 DIAGNOSIS — Z79890 Hormone replacement therapy (postmenopausal): Secondary | ICD-10-CM | POA: Diagnosis not present

## 2023-01-21 DIAGNOSIS — E119 Type 2 diabetes mellitus without complications: Secondary | ICD-10-CM

## 2023-01-21 DIAGNOSIS — E559 Vitamin D deficiency, unspecified: Secondary | ICD-10-CM | POA: Diagnosis present

## 2023-01-21 DIAGNOSIS — Z951 Presence of aortocoronary bypass graft: Secondary | ICD-10-CM

## 2023-01-21 DIAGNOSIS — I2510 Atherosclerotic heart disease of native coronary artery without angina pectoris: Secondary | ICD-10-CM | POA: Diagnosis present

## 2023-01-21 DIAGNOSIS — D649 Anemia, unspecified: Secondary | ICD-10-CM | POA: Diagnosis not present

## 2023-01-21 DIAGNOSIS — R5383 Other fatigue: Secondary | ICD-10-CM | POA: Diagnosis present

## 2023-01-21 DIAGNOSIS — D509 Iron deficiency anemia, unspecified: Secondary | ICD-10-CM | POA: Diagnosis present

## 2023-01-21 DIAGNOSIS — K921 Melena: Secondary | ICD-10-CM | POA: Diagnosis not present

## 2023-01-21 LAB — CBC WITH DIFFERENTIAL/PLATELET
Abs Immature Granulocytes: 0.01 10*3/uL (ref 0.00–0.07)
Basophils Absolute: 0 10*3/uL (ref 0.0–0.1)
Basophils Relative: 0 %
Eosinophils Absolute: 0.1 10*3/uL (ref 0.0–0.5)
Eosinophils Relative: 2 %
HCT: 18.7 % — ABNORMAL LOW (ref 36.0–46.0)
Hemoglobin: 5.4 g/dL — CL (ref 12.0–15.0)
Immature Granulocytes: 0 %
Lymphocytes Relative: 28 %
Lymphs Abs: 1.4 10*3/uL (ref 0.7–4.0)
MCH: 18.7 pg — ABNORMAL LOW (ref 26.0–34.0)
MCHC: 28.9 g/dL — ABNORMAL LOW (ref 30.0–36.0)
MCV: 64.7 fL — ABNORMAL LOW (ref 80.0–100.0)
Monocytes Absolute: 0.6 10*3/uL (ref 0.1–1.0)
Monocytes Relative: 11 %
Neutro Abs: 3 10*3/uL (ref 1.7–7.7)
Neutrophils Relative %: 59 %
Platelets: 357 10*3/uL (ref 150–400)
RBC: 2.89 MIL/uL — ABNORMAL LOW (ref 3.87–5.11)
RDW: 17.1 % — ABNORMAL HIGH (ref 11.5–15.5)
WBC: 5.1 10*3/uL (ref 4.0–10.5)
nRBC: 0 % (ref 0.0–0.2)

## 2023-01-21 LAB — PREPARE RBC (CROSSMATCH)

## 2023-01-21 LAB — TROPONIN I (HIGH SENSITIVITY): Troponin I (High Sensitivity): 9 ng/L (ref <18)

## 2023-01-21 LAB — RETICULOCYTES
Immature Retic Fract: 5.8 % (ref 2.3–15.9)
RBC.: 2.85 MIL/uL — ABNORMAL LOW (ref 3.87–5.11)
Retic Count, Absolute: 30.8 10*3/uL (ref 19.0–186.0)
Retic Ct Pct: 1.1 % (ref 0.4–3.1)

## 2023-01-21 LAB — OCCULT BLOOD X 1 CARD TO LAB, STOOL: Fecal Occult Bld: NEGATIVE

## 2023-01-21 MED ORDER — SODIUM CHLORIDE 0.9% IV SOLUTION: Freq: Once | INTRAVENOUS | Status: DC

## 2023-01-21 NOTE — ED Triage Notes (Addendum)
[facility] UC for feeling fatigued, left shoulder pain, palpitations- called for lab work- HGB 5.0. URQ pain today, constipation, darkening of stool.

## 2023-01-21 NOTE — ED Provider Notes (Incomplete)
[place] EMERGENCY DEPARTMENT AT [facility] Provider Note   CSN: [id number] Arrival date & time: 01/21/23  1834     History {Add pertinent medical, surgical, social history, OB history to HPI:1} No chief complaint on file.   Debra Fry is a 82 y.o. female.  Patient is a 82 yo female presenting right shoulder blade pain radiating to the right chest with exertional sob, palpitations, and dizziness. New cough. Seen in UC prior to ED and had outpatient labs with critical low hemoglobin level. Repeat here 5.1. Pale on exam. Remote history of stomach ulcers. Denies black or blood stools.   The history is provided by the patient. No language interpreter was used.       Home Medications Prior to Admission medications   Medication Sig Start Date End Date Taking? Authorizing Provider  amoxicillin-clavulanate (AUGMENTIN) 875-125 MG tablet Take 1 tablet by mouth 2 (two) times daily. 09/15/20   Hilts, Casimiro Needle, MD  aspirin 81 MG chewable tablet Chew 1 tablet (81 mg total) by mouth daily. 01/30/18   Marguerita Merles Latif, DO  carbamazepine (CARBATROL) 100 MG 12 hr capsule Take 1 capsule (100 mg total) by mouth 2 (two) times daily as needed (for acute TN flare up). 09/15/20   Hilts, Casimiro Needle, MD  ezetimibe (ZETIA) 10 MG tablet Take 1 tablet (10 mg total) by mouth daily. 09/26/19   Ihor Austin, NP  gabapentin (NEURONTIN) 300 MG capsule Take 1 capsule (300 mg total) by mouth 3 (three) times daily. 05/25/22   Piontek, Denny Peon, MD  levothyroxine (SYNTHROID) 50 MCG tablet Take 1 tablet (50 mcg total) by mouth daily before breakfast. 03/03/20   Hilts, Casimiro Needle, MD  omeprazole (PRILOSEC) 20 MG capsule TAKE 1 CAPSULE(20 MG) BY MOUTH DAILY AS NEEDED 09/15/20   Hilts, Casimiro Needle, MD  rosuvastatin (CRESTOR) 5 MG tablet Take 1 tablet (5 mg total) by mouth daily. 09/26/19   Ihor Austin, NP  vitamin C (ASCORBIC ACID) 500 MG tablet Take 500 mg by mouth daily.    [provider]      Allergies     Atorvastatin    Review of Systems   Review of Systems  Physical Exam Updated Vital Signs BP (!) 144/60   Pulse 74   Temp 98.8 F (37.1 C)   Resp 16   SpO2 100%  Physical Exam  ED Results / Procedures / Treatments   Labs (all labs ordered are listed, but only abnormal results are displayed) Labs Reviewed  CBC WITH DIFFERENTIAL/PLATELET - Abnormal; Notable for the following components:      Result Value   RBC 2.89 (*)    Hemoglobin 5.4 (*)    HCT 18.7 (*)    MCV 64.7 (*)    MCH 18.7 (*)    MCHC 28.9 (*)    RDW 17.1 (*)    All other components within normal limits  OCCULT BLOOD X 1 CARD TO LAB, STOOL  POC OCCULT BLOOD, ED  TYPE AND SCREEN  PREPARE RBC (CROSSMATCH)  TROPONIN I (HIGH SENSITIVITY)    EKG None  Radiology DG Chest Port 1 View Result Date: 01/21/2023 CLINICAL DATA:  Chest pain EXAM: PORTABLE CHEST 1 VIEW COMPARISON:  08/25/2014 FINDINGS: Cardiac shadow is within normal limits. Postoperative changes are again seen. Aortic calcifications are noted. Lungs are well aerated bilaterally. No focal infiltrate or effusion is seen. Chronic interstitial changes are noted. No bony abnormality is seen. IMPRESSION: No acute abnormality noted. Electronically Signed   By: Eulah Pont.D.  On: 01/21/2023 20:27    Procedures .Critical Care  Performed by: Franne Forts, DO Authorized by: Franne Forts, DO   Critical care provider statement:    Critical care time (minutes):  30   Critical care was time spent personally by me on the following activities:  Development of treatment plan with patient or surrogate, discussions with consultants, evaluation of patient's response to treatment, examination of patient, ordering and review of laboratory studies, ordering and review of radiographic studies, ordering and performing treatments and interventions, pulse oximetry, re-evaluation of patient's condition and review of old charts   {Document cardiac monitor, telemetry  assessment procedure when appropriate:1}  Medications Ordered in ED Medications - No data to display   ED Course/ Medical Decision Making/ A&P   {   Click here for ABCD2, HEART and other calculatorsREFRESH Note before signing :1}                              Medical Decision Making Amount and/or Complexity of Data Reviewed Labs: ordered. Radiology: ordered.  Risk Prescription drug management.    82 yo female presenting right shoulder blade pain radiating to the right chest with exertional sob, palpitations, and dizziness.   Hemoglobin 5.4 Adult blood tranfusion ordered   Denies black or bloody stools Fecal occult pending Bmp pending for renal function  Patient accepted for ER [facility] by ED physician Dr. Rhae Hammock  for type and screen for blood transfusion for anemia of 5.4.  I also spoke with admitting physician Dr. Joneen Roach who is up-to-date on patient's history.  Fecal occult negative.  {Document critical care time when appropriate:1} {Document review of labs and clinical decision tools ie heart score, Chads2Vasc2 etc:1}  {Document your independent review of radiology images, and any outside records:1} {Document your discussion with family members, caretakers, and with consultants:1} {Document social determinants of health affecting pt's care:1} {Document your decision making why or why not admission, treatments were needed:1} Final Clinical Impression(s) / ED Diagnoses Final diagnoses:  Anemia, unspecified type    Rx / DC Orders ED Discharge Orders     None

## 2023-01-22 ENCOUNTER — Encounter (HOSPITAL_COMMUNITY): Payer: Self-pay | Admitting: Family Medicine

## 2023-01-22 DIAGNOSIS — D649 Anemia, unspecified: Secondary | ICD-10-CM

## 2023-01-22 DIAGNOSIS — E538 Deficiency of other specified B group vitamins: Secondary | ICD-10-CM | POA: Diagnosis present

## 2023-01-22 DIAGNOSIS — D62 Acute posthemorrhagic anemia: Secondary | ICD-10-CM | POA: Diagnosis present

## 2023-01-22 DIAGNOSIS — D509 Iron deficiency anemia, unspecified: Secondary | ICD-10-CM | POA: Diagnosis present

## 2023-01-22 LAB — COMPREHENSIVE METABOLIC PANEL
ALT: 13 U/L (ref 0–44)
AST: 17 U/L (ref 15–41)
Albumin: 3.7 g/dL (ref 3.5–5.0)
Alkaline Phosphatase: 71 U/L (ref 38–126)
Anion gap: 10 (ref 5–15)
BUN: 10 mg/dL (ref 8–23)
CO2: 21 mmol/L — ABNORMAL LOW (ref 22–32)
Calcium: 8.9 mg/dL (ref 8.9–10.3)
Chloride: 106 mmol/L (ref 98–111)
Creatinine, Ser: 0.63 mg/dL (ref 0.44–1.00)
GFR, Estimated: >60 mL/min (ref >60)
Glucose, Bld: 96 mg/dL (ref 70–99)
Potassium: 4.1 mmol/L (ref 3.5–5.1)
Sodium: 137 mmol/L (ref 135–145)
Total Bilirubin: 1.5 mg/dL — ABNORMAL HIGH (ref <1.2)
Total Protein: 7.4 g/dL (ref 6.5–8.1)

## 2023-01-22 LAB — IRON AND TIBC
Iron: 18 ug/dL — ABNORMAL LOW (ref 28–170)
Saturation Ratios: 3 % — ABNORMAL LOW (ref 10.4–31.8)
TIBC: 602 ug/dL — ABNORMAL HIGH (ref 250–450)
UIBC: 584 ug/dL

## 2023-01-22 LAB — HEMOGLOBIN AND HEMATOCRIT, BLOOD
HCT: 28.8 % — ABNORMAL LOW (ref 36.0–46.0)
HCT: 27.3 % — ABNORMAL LOW (ref 36.0–46.0)
HCT: 28.3 % — ABNORMAL LOW (ref 36.0–46.0)
HCT: 26.9 % — ABNORMAL LOW (ref 36.0–46.0)
Hemoglobin: 8.9 g/dL — ABNORMAL LOW (ref 12.0–15.0)
Hemoglobin: 8.5 g/dL — ABNORMAL LOW (ref 12.0–15.0)
Hemoglobin: 8.5 g/dL — ABNORMAL LOW (ref 12.0–15.0)
Hemoglobin: 8.4 g/dL — ABNORMAL LOW (ref 12.0–15.0)

## 2023-01-22 LAB — FERRITIN: Ferritin: 3 ng/mL — ABNORMAL LOW (ref 11–307)

## 2023-01-22 LAB — PREPARE RBC (CROSSMATCH)

## 2023-01-22 LAB — ABO/RH: ABO/RH(D): A POS

## 2023-01-22 LAB — FOLATE: Folate: 14.8 ng/mL (ref >5.9)

## 2023-01-22 LAB — VITAMIN B12: Vitamin B-12: 155 pg/mL — ABNORMAL LOW (ref 180–914)

## 2023-01-22 LAB — TSH: TSH: 4.062 u[IU]/mL (ref 0.350–4.500)

## 2023-01-22 MED ORDER — GUAIFENESIN 100 MG/5ML PO LIQD
5.0000 mL | ORAL | Status: DC | PRN
  Administered 2023-01-22: 5 mL via ORAL
  Filled 2023-01-22: qty 10

## 2023-01-22 MED ORDER — ONDANSETRON HCL 4 MG PO TABS: 4.0000 mg | ORAL_TABLET | Freq: Four times a day (QID) | ORAL | Status: DC | PRN

## 2023-01-22 MED ORDER — SODIUM CHLORIDE 0.9 % IV SOLN: INTRAVENOUS | Status: DC

## 2023-01-22 MED ORDER — ACETAMINOPHEN 650 MG RE SUPP: 650.0000 mg | Freq: Four times a day (QID) | RECTAL | Status: DC | PRN

## 2023-01-22 MED ORDER — PANTOPRAZOLE SODIUM 40 MG IV SOLR
40.0000 mg | Freq: Two times a day (BID) | INTRAVENOUS | Status: DC
Start: 2023-01-22 — End: 2023-01-23
  Administered 2023-01-23 (×2): 40 mg via INTRAVENOUS
  Filled 2023-01-22 (×3): qty 10

## 2023-01-22 MED ORDER — ONDANSETRON HCL 4 MG/2ML IJ SOLN: 4.0000 mg | Freq: Four times a day (QID) | INTRAMUSCULAR | Status: DC | PRN

## 2023-01-22 MED ORDER — PANTOPRAZOLE SODIUM 40 MG IV SOLR
40.0000 mg | INTRAVENOUS | Status: AC
  Administered 2023-01-22 (×2): 40 mg via INTRAVENOUS
  Filled 2023-01-22: qty 10

## 2023-01-22 MED ORDER — ACETAMINOPHEN 325 MG PO TABS: 650.0000 mg | ORAL_TABLET | Freq: Four times a day (QID) | ORAL | Status: DC | PRN

## 2023-01-22 MED ORDER — ORAL CARE MOUTH RINSE: 15.0000 mL | OROMUCOSAL | Status: DC | PRN

## 2023-01-22 MED ORDER — LACTATED RINGERS IV SOLN: INTRAVENOUS | Status: DC

## 2023-01-22 MED ORDER — VITAMIN D 25 MCG (1000 UNIT) PO TABS
2000.0000 [IU] | ORAL_TABLET | Freq: Every day | ORAL | Status: DC
  Administered 2023-01-22 – 2023-01-23 (×2): 2000 [IU] via ORAL
  Filled 2023-01-22 (×2): qty 2

## 2023-01-22 MED ORDER — CYANOCOBALAMIN 1000 MCG/ML IJ SOLN
1000.0000 ug | Freq: Once | INTRAMUSCULAR | Status: AC
  Administered 2023-01-22: 1000 ug via INTRAMUSCULAR
  Filled 2023-01-22: qty 1

## 2023-01-22 MED ORDER — SODIUM CHLORIDE 0.9% IV SOLUTION: Freq: Once | INTRAVENOUS | Status: AC

## 2023-01-22 MED ORDER — VITAMIN B-12 1000 MCG PO TABS: 1000.0000 ug | ORAL_TABLET | Freq: Every day | ORAL | Status: DC

## 2023-01-22 NOTE — ED Notes (Signed)
ED TO INPATIENT HANDOFF REPORT  ED Nurse Name and Phone #: Rebecca Eaton RN [phone]  S Name/Age/Gender Debra Fry 82 y.o. female Room/Bed: WA21/WA21  Code Status   Code Status: Prior  Home/SNF/Other Home Patient oriented to: self, place, time, and situation Is this baseline? Yes   Triage Complete: Triage complete  Chief Complaint Symptomatic anemia [D64.9]  Triage Note [facility] for feeling fatigued, left shoulder pain, palpitations- called for lab work- HGB 5.0. URQ pain today, constipation, darkening of stool.   Allergies Allergies  Allergen Reactions   Atorvastatin     Knee pain    Level of Care/Admitting Diagnosis ED Disposition     ED Disposition  Admit   Condition  --   Comment  Hospital Area: [facility] [[id number]]  Level of Care: Progressive [102]  Admit to Progressive based on following criteria: MULTISYSTEM THREATS such as stable sepsis, metabolic/electrolyte imbalance with or without encephalopathy that is responding to early treatment.  May admit patient to [facility] or [facility] if equivalent level of care is available:: No  Interfacility transfer: Yes  Covid Evaluation: Asymptomatic - no recent exposure (last 10 days) testing not required  Diagnosis: Symptomatic anemia [8469629]  Admitting Physician: Gery Pray [[id number]]  Attending Physician: Gery Pray [[id number]]  Certification:: I certify this patient will need inpatient services for at least 2 midnights  Expected Medical Readiness: 01/23/2023          B Medical/Surgery History Past Medical History:  Diagnosis Date   Coronary artery disease    GERD (gastroesophageal reflux disease)    Headache    History of hiatal hernia    Hyperlipidemia    Myocardial infarction (HCC) 12/1999   Stroke [facility])    Past Surgical History:  Procedure Laterality Date   CARDIAC CATHETERIZATION  2001   results in Echart conversion   CORONARY ARTERY BYPASS GRAFT  2001   ENDARTERECTOMY  Left 02/13/2018   Procedure: ENDARTERECTOMY CAROTID;  Surgeon: Sherren Kerns, MD;  [facility] OR;  Service: Vascular;  Laterality: Left;   OPEN REDUCTION INTERNAL FIXATION (ORIF) DISTAL RADIAL FRACTURE Left 11/23/2018   Procedure: OPEN REDUCTION INTERNAL FIXATION (ORIF) LEFT DISTAL RADIAL AND ULNA  FRACTURES;  Surgeon: Betha Loa, MD;  Location: [facility];  Service: Orthopedics;  Laterality: Left;     A IV Location/Drains/Wounds Patient Lines/Drains/Airways Status     Active Line/Drains/Airways     Name Placement date Placement time Site Days   Peripheral IV 01/21/23 18 G Anterior;Proximal;Right Forearm 01/21/23  1856  Forearm  1            Intake/Output Last 24 hours No intake or output data in the 24 hours ending 01/22/23 0038  Labs/Imaging Results for orders placed or performed during the hospital encounter of 01/21/23 (from the past 48 hours)  CBC with Differential     Status: Abnormal   Collection Time: 01/21/23  6:52 PM  Result Value Ref Range   WBC 5.1 4.0 - 10.5 K/uL   RBC 2.89 (L) 3.87 - 5.11 MIL/uL   Hemoglobin 5.4 (LL) 12.0 - 15.0 g/dL    Comment: REPEATED TO VERIFY Reticulocyte Hemoglobin testing may be clinically indicated, consider ordering this additional test [id number] THIS CRITICAL RESULT HAS VERIFIED AND BEEN CALLED TO W. Barbette Hair BY JAMES FALICKI ON 12 27 2024 AT 1921, AND HAS BEEN READ BACK.     HCT 18.7 (L) 36.0 - 46.0 %   MCV 64.7 (L) 80.0 - 100.0 fL  MCH 18.7 (L) 26.0 - 34.0 pg   MCHC 28.9 (L) 30.0 - 36.0 g/dL   RDW 57.8 (H) 46.9 - 62.9 %   Platelets 357 150 - 400 K/uL   nRBC 0.0 0.0 - 0.2 %   Neutrophils Relative % 59 %   Neutro Abs 3.0 1.7 - 7.7 K/uL   Lymphocytes Relative 28 %   Lymphs Abs 1.4 0.7 - 4.0 K/uL   Monocytes Relative 11 %   Monocytes Absolute 0.6 0.1 - 1.0 K/uL   Eosinophils Relative 2 %   Eosinophils Absolute 0.1 0.0 - 0.5 K/uL   Basophils Relative 0 %   Basophils Absolute 0.0 0.0 - 0.1 K/uL    Immature Granulocytes 0 %   Abs Immature Granulocytes 0.01 0.00 - 0.07 K/uL    Comment: Performed at [facility], [address] [place], [place] [address]  Troponin I (High Sensitivity)     Status: None   Collection Time: 01/21/23  6:52 PM  Result Value Ref Range   Troponin I (High Sensitivity) 9 <18 ng/L    Comment: (NOTE) Elevated high sensitivity troponin I (hsTnI) values and significant  changes across serial measurements may suggest ACS but many other  chronic and acute conditions are known to elevate hsTnI results.  Refer to the "Links" section for chest pain algorithms and additional  guidance. Performed at [facility], [address] [place], [place] [address]   Occult blood card to lab, stool RN will collect     Status: None   Collection Time: 01/21/23  9:43 PM  Result Value Ref Range   Fecal Occult Bld NEGATIVE NEGATIVE    Comment: Performed at [facility], [address] [place], [place] [address]  Folate     Status: None   Collection Time: 01/21/23 11:14 PM  Result Value Ref Range   Folate 14.8 >5.9 ng/mL    Comment: Performed at [facility], [address] [place], [place] [address]  Reticulocytes     Status: Abnormal   Collection Time: 01/21/23 11:14 PM  Result Value Ref Range   Retic Ct Pct 1.1 0.4 - 3.1 %   RBC. 2.85 (L) 3.87 - 5.11 MIL/uL   Retic Count, Absolute 30.8 19.0 - 186.0 K/uL   Immature Retic Fract 5.8 2.3 - 15.9 %    Comment: Performed at [facility], [address] [place], [place] [address]  Type and screen [facility]     Status: None (Preliminary result)   Collection Time: 01/21/23 11:15 PM  Result Value Ref Range   ABO/RH(D) PENDING    Antibody Screen PENDING    Sample Expiration      01/24/2023,2359 Performed at [facility], [address], [place], [place] [address]   Prepare RBC (crossmatch)      Status: None   Collection Time: 01/21/23 11:15 PM  Result Value Ref Range   Order Confirmation      ORDER PROCESSED BY BLOOD BANK Performed at [facility] [address], [place], [place] [address]   Vitamin B12     Status: Abnormal   Collection Time: 01/21/23 11:15 PM  Result Value Ref Range   Vitamin B-12 155 (L) 180 - 914 pg/mL    Comment: (NOTE) This assay is not validated for testing neonatal or myeloproliferative syndrome specimens for Vitamin B12 levels. Performed at [facility], [address], [place], [place] [address]   Iron and  TIBC     Status: Abnormal   Collection Time: 01/21/23 11:15 PM  Result Value Ref Range   Iron 18 (L) 28 - 170 ug/dL   TIBC 725 (H) 366 - 440 ug/dL   Saturation Ratios 3 (L) 10.4 - 31.8 %   UIBC 584 ug/dL    Comment: Performed at [facility], [address], [place], [place] [address]  Ferritin     Status: Abnormal   Collection Time: 01/21/23 11:15 PM  Result Value Ref Range   Ferritin 3 (L) 11 - 307 ng/mL    Comment: Performed at [facility], [address], [place], [place] [address]   DG Chest Port 1 View Result Date: 01/21/2023 CLINICAL DATA:  Chest pain EXAM: PORTABLE CHEST 1 VIEW COMPARISON:  08/25/2014 FINDINGS: Cardiac shadow is within normal limits. Postoperative changes are again seen. Aortic calcifications are noted. Lungs are well aerated bilaterally. No focal infiltrate or effusion is seen. Chronic interstitial changes are noted. No bony abnormality is seen. IMPRESSION: No acute abnormality noted. Electronically Signed   By: Alcide Clever M.D.   On: 01/21/2023 20:27    Pending Labs Unresulted Labs (From admission, onward)     Start     Ordered   01/21/23 2345  ABO/Rh  Once,   R        01/21/23 2345            Vitals/Pain Today's Vitals   01/21/23 1851 01/21/23 2030 01/21/23 2045 01/21/23 2230  BP:  137/84 (!) 144/60 (!) 155/50  Pulse:   75 74 70  Resp:  15 16 13   Temp:      SpO2:  99% 100% 99%  PainSc: 4        Isolation Precautions No active isolations  Medications Medications - No data to display  Mobility walks     Focused Assessments     R Recommendations: See Admitting Provider Note  Report given to:   Additional Notes:

## 2023-01-22 NOTE — H&P (Signed)
History and Physical    Patient: Debra Fry MWU:[id number] DOB: 02/07/1940 DOA: 01/21/2023 DOS: the patient was seen and examined on 01/22/2023 PCP: Patient, No Pcp Per  Patient coming from: Home  Chief Complaint: Fatigue and low hemoglobin level.  HPI: Debra Fry is a 82 y.o. female with medical history significant of CAD, history of MI, GERD, headache, hiatal hernia, hyperlipidemia, history of CVA, nonbleeding PUD in the early 90s who presented to the emergency department with complaints of progressively worse weakness, fatigue, palpitations, dyspnea and dizziness.  She also had chest pressure yesterday.  No nausea, emesis or diaphoresis.  Questionable melena apparently she has had some episodes of it but not in recent days.  She gets occasionally constipated, but has a good bowel regimen to keep regularity.  No pitting edema of the lower extremities.  She has been taking on and off OTC ibuprofen.  Sometimes she takes an aspirin.  No fever, chills or night sweats. No sore throat, rhinorrhea, dyspnea, wheezing or hemoptysis. No flank pain, dysuria, frequency or hematuria.  No polyuria, polydipsia, polyphagia or blurred vision.  Lab work: CBC showed a white count of 5.1, hemoglobin 5.4 g/dL with an MCV of 72.5 fL and platelets 357.  Fecal occult blood was negative.  Vitamin B-12 was 155 pg/mL, folate 14.8 and ferritin 3 ng/mL, iron 18 and TIBC 602 mcg/dL.  Fecal occult blood was negative.  Troponin was normal.  CMP showed a CO2 of 21 mmol/L and total bilirubin 1.5 mg/dL.  Imaging: Portable 1 view chest radiograph with cardiac shadow within normal limits.  Chronic interstitial changes with no infiltrates or effusion.   ED course: Initial vital signs were temperature 98.8 F, pulse 86, respiration 18, BP 168/46 mmHg and O2 sat 99% on room air.  On arrival to the facility the patient received a 1 unit of PRBC blood transfusion.  Review of Systems: As mentioned in the history of present illness. All  other systems reviewed and are negative.  Past Medical History:  Diagnosis Date   Coronary artery disease    GERD (gastroesophageal reflux disease)    Headache    History of hiatal hernia    Hyperlipidemia    Myocardial infarction (HCC) 12/1999   Stroke [facility])    Past Surgical History:  Procedure Laterality Date   CARDIAC CATHETERIZATION  2001   results in Echart conversion   CORONARY ARTERY BYPASS GRAFT  2001   ENDARTERECTOMY Left 02/13/2018   Procedure: ENDARTERECTOMY CAROTID;  Surgeon: Sherren Kerns, MD;  Location: [facility] OR;  Service: Vascular;  Laterality: Left;   OPEN REDUCTION INTERNAL FIXATION (ORIF) DISTAL RADIAL FRACTURE Left 11/23/2018   Procedure: OPEN REDUCTION INTERNAL FIXATION (ORIF) LEFT DISTAL RADIAL AND ULNA  FRACTURES;  Surgeon: Betha Loa, MD;  Location: [facility];  Service: Orthopedics;  Laterality: Left;   Social History:  reports that she has never smoked. She has never used smokeless tobacco. She reports that she does not drink alcohol and does not use drugs.  Allergies  Allergen Reactions   Atorvastatin     Knee pain    Family History  Family history unknown: Yes    Prior to Admission medications   Medication Sig Start Date End Date Taking? Authorizing Provider  gabapentin (NEURONTIN) 300 MG capsule Take 1 capsule (300 mg total) by mouth 3 (three) times daily. Patient taking differently: Take 300 mg by mouth as needed (Pain / Headaches). 05/25/22  Yes Piontek, Denny Peon, MD  amoxicillin-clavulanate (AUGMENTIN) 875-125 MG tablet Take  1 tablet by mouth 2 (two) times daily. Patient not taking: Reported on 01/21/2023 09/15/20   Hilts, Casimiro Needle, MD  aspirin 81 MG chewable tablet Chew 1 tablet (81 mg total) by mouth daily. Patient not taking: Reported on 01/22/2023 01/30/18   Marguerita Merles Latif, DO  carbamazepine (CARBATROL) 100 MG 12 hr capsule Take 1 capsule (100 mg total) by mouth 2 (two) times daily as needed (for acute TN flare up). Patient  not taking: Reported on 01/22/2023 09/15/20   Hilts, Casimiro Needle, MD  ezetimibe (ZETIA) 10 MG tablet Take 1 tablet (10 mg total) by mouth daily. Patient not taking: Reported on 01/21/2023 09/26/19   Ihor Austin, NP  levothyroxine (SYNTHROID) 50 MCG tablet Take 1 tablet (50 mcg total) by mouth daily before breakfast. Patient not taking: Reported on 01/22/2023 03/03/20   Hilts, Casimiro Needle, MD  omeprazole (PRILOSEC) 20 MG capsule TAKE 1 CAPSULE(20 MG) BY MOUTH DAILY AS NEEDED Patient not taking: Reported on 01/22/2023 09/15/20   Hilts, Casimiro Needle, MD  rosuvastatin (CRESTOR) 5 MG tablet Take 1 tablet (5 mg total) by mouth daily. Patient not taking: Reported on 01/21/2023 09/26/19   Ihor Austin, NP  vitamin C (ASCORBIC ACID) 500 MG tablet Take 500 mg by mouth daily. Patient not taking: Reported on 01/22/2023    [provider]    Physical Exam: Vitals:   01/22/23 0549 01/22/23 0624 01/22/23 0644 01/22/23 1019  BP: (!) 161/52 (!) 137/45 (!) 136/49 (!) 147/51  Pulse: 65 65 66 66  Resp: 16 18 (!) 21 17  Temp: 98.2 F (36.8 C) 98.9 F (37.2 C) 98.7 F (37.1 C) 99.1 F (37.3 C)  TempSrc: Oral Oral Oral Oral  SpO2: 99% 97% 98% 97%  Weight:      Height:       Physical Exam Vitals and nursing note reviewed.  Constitutional:      General: She is awake. She is not in acute distress.    Appearance: Normal appearance. She is ill-appearing.  HENT:     Head: Normocephalic.     Nose: No rhinorrhea.  Eyes:     General: No scleral icterus.    Pupils: Pupils are equal, round, and reactive to light.  Neck:     Vascular: No JVD.  Cardiovascular:     Rate and Rhythm: Normal rate and regular rhythm.     Heart sounds: S1 normal and S2 normal.  Pulmonary:     Effort: Pulmonary effort is normal.     Breath sounds: Normal breath sounds.  Abdominal:     General: Bowel sounds are normal.     Palpations: Abdomen is soft.  Musculoskeletal:     Cervical back: Neck supple.     Right lower leg: No edema.      Left lower leg: No edema.  Skin:    General: Skin is warm and dry.     Coloration: Skin is pale.  Neurological:     General: No focal deficit present.     Mental Status: She is alert and oriented to person, place, and time.  Psychiatric:        Mood and Affect: Mood normal.        Behavior: Behavior normal. Behavior is cooperative.     Data Reviewed:  Results are pending, will review when available. 01/28/2018 echocardiogram report  Indications:     CVA 436.   -------------------------------------------------------------------  History:  PMH:   Coronary artery disease.  Stroke.  Risk factors:  Dyslipidemia.   -------------------------------------------------------------------  Study Conclusions   - Left ventricle: The cavity size was normal. Wall thickness was    increased in a pattern of mild LVH. Systolic function was normal.    The estimated ejection fraction was in the range of 55% to 60%.    Wall motion was normal; there were no regional wall motion    abnormalities.  - Mitral valve: Calcified annulus. There was mild regurgitation.   Impressions:   - No cardiac source of emboli was indentified.   EKG: Vent. rate 81 BPM PR interval 134 ms QRS duration 82 ms QT/QTcB 362/420 ms P-R-T axes 63 70 49 Normal sinus rhythm Nonspecific ST and T wave abnormality Abnormal ECG  Assessment and Plan: Principal Problem:   Symptomatic anemia   ABLA (acute blood loss anemia)   Iron deficiency anemia Likely due to acute on chronic GI bleed. Feeling better after 2 PRBC transfusion. Admit to PCU/inpatient. Clear liquid diet. Keep NPO after midnight. Continue high-dose pantoprazole IVP. Monitor H&H. Transfuse further as needed. Would benefit from iron supplementation. GI consult greatly appreciated.  Active Problems:   Vitamin D deficiency Resume vitamin D supplementation.    B12 deficiency Cyanocobalamin mean 1000 mcg IM x 1. Begin cyanocobalamin 1000 mcg  p.o. daily.    Familial hyperlipidemia No longer taking the statin. Follow-up with primary care provider.    Diabetes (HCC) Carbohydrate modified diet. CBG monitoring with RI SS. Check hemoglobin A1c.    Hypothyroidism Has not been taking levothyroxine 50 mcg daily. Check TSH level with next blood draw.    Gout Asymptomatic.    Advance Care Planning:   Code Status: Full Code   Consults: [facility] Charlott Rakes, MD).  Family Communication: Her daughter and daughter-in-law were at bedside.  Severity of Illness: The appropriate patient status for this patient is INPATIENT. Inpatient status is judged to be reasonable and necessary in order to provide the required intensity of service to ensure the patient's safety. The patient's presenting symptoms, physical exam findings, and initial radiographic and laboratory data in the context of their chronic comorbidities is felt to place them at high risk for further clinical deterioration. Furthermore, it is not anticipated that the patient will be medically stable for discharge from the hospital within 2 midnights of admission.   * I certify that at the point of admission it is my clinical judgment that the patient will require inpatient hospital care spanning beyond 2 midnights from the point of admission due to high intensity of service, high risk for further deterioration and high frequency of surveillance required.*  Author: Bobette Mo, MD 01/22/2023 11:37 AM  For on call review [email or web address].   This document was prepared using Dragon voice recognition software and may contain some unintended transcription errors.

## 2023-01-22 NOTE — Plan of Care (Incomplete)
Patient alert, no complains of pain, no s/s of respiratory distress and   Problem: Health Behavior/Discharge Planning: Goal: Ability to manage health-related needs will improve Outcome: Progressing   Problem: Clinical Measurements: Goal: Ability to maintain clinical measurements within normal limits will improve Outcome: Progressing

## 2023-01-22 NOTE — H&P (View-Only) (Signed)
Referring Provider: Dr. Robb Matar Primary Care Physician:  Patient, No Pcp Per Primary Gastroenterologist:  Gentry Fitz  Reason for Consultation:  Anemia  HPI: Debra Fry is a 82 y.o. female with progressive weakness over the past several weeks who got dizzy prior to admit. Occasional black stools in recent past but states stools were dark brown prior to admit. Denies red blood per rectum. Denies abdominal pain, N/V. Denies ever having a colonoscopy. Thinks she had an EGD 30 years ago that showed ulcers (records not available at this time). Hgb 5.4 on admit and transfused 2 U PRBCs to 8.9. Few doses of NSAIDs 2 weeks ago. Denies alcohol. Daughter-in-law in room helps with history.  Past Medical History:  Diagnosis Date   Coronary artery disease    GERD (gastroesophageal reflux disease)    Headache    History of hiatal hernia    Hyperlipidemia    Myocardial infarction (HCC) 12/1999   Stroke [facility])     Past Surgical History:  Procedure Laterality Date   CARDIAC CATHETERIZATION  2001   results in Echart conversion   CORONARY ARTERY BYPASS GRAFT  2001   ENDARTERECTOMY Left 02/13/2018   Procedure: ENDARTERECTOMY CAROTID;  Surgeon: Sherren Kerns, MD;  Location: [facility] OR;  Service: Vascular;  Laterality: Left;   OPEN REDUCTION INTERNAL FIXATION (ORIF) DISTAL RADIAL FRACTURE Left 11/23/2018   Procedure: OPEN REDUCTION INTERNAL FIXATION (ORIF) LEFT DISTAL RADIAL AND ULNA  FRACTURES;  Surgeon: Betha Loa, MD;  Location: [facility];  Service: Orthopedics;  Laterality: Left;    Prior to Admission medications   Medication Sig Start Date End Date Taking? Authorizing Provider  gabapentin (NEURONTIN) 300 MG capsule Take 1 capsule (300 mg total) by mouth 3 (three) times daily. Patient taking differently: Take 300 mg by mouth as needed (Pain / Headaches). 05/25/22  Yes Piontek, Denny Peon, MD  amoxicillin-clavulanate (AUGMENTIN) 875-125 MG tablet Take 1 tablet by mouth 2 (two) times  daily. Patient not taking: Reported on 01/21/2023 09/15/20   Hilts, Casimiro Needle, MD  aspirin 81 MG chewable tablet Chew 1 tablet (81 mg total) by mouth daily. Patient not taking: Reported on 01/22/2023 01/30/18   Marguerita Merles Latif, DO  carbamazepine (CARBATROL) 100 MG 12 hr capsule Take 1 capsule (100 mg total) by mouth 2 (two) times daily as needed (for acute TN flare up). Patient not taking: Reported on 01/22/2023 09/15/20   Hilts, Casimiro Needle, MD  ezetimibe (ZETIA) 10 MG tablet Take 1 tablet (10 mg total) by mouth daily. Patient not taking: Reported on 01/21/2023 09/26/19   Ihor Austin, NP  levothyroxine (SYNTHROID) 50 MCG tablet Take 1 tablet (50 mcg total) by mouth daily before breakfast. Patient not taking: Reported on 01/22/2023 03/03/20   Hilts, Casimiro Needle, MD  omeprazole (PRILOSEC) 20 MG capsule TAKE 1 CAPSULE(20 MG) BY MOUTH DAILY AS NEEDED Patient not taking: Reported on 01/22/2023 09/15/20   Hilts, Casimiro Needle, MD  rosuvastatin (CRESTOR) 5 MG tablet Take 1 tablet (5 mg total) by mouth daily. Patient not taking: Reported on 01/21/2023 09/26/19   Ihor Austin, NP  vitamin C (ASCORBIC ACID) 500 MG tablet Take 500 mg by mouth daily. Patient not taking: Reported on 01/22/2023    [provider]    Scheduled Meds:  pantoprazole (PROTONIX) IV  40 mg Intravenous Q12H   Continuous Infusions:  lactated ringers 50 mL/hr at 01/22/23 1109   PRN Meds:.acetaminophen **OR** acetaminophen, guaiFENesin, ondansetron **OR** ondansetron (ZOFRAN) IV, mouth rinse  Allergies as of 01/21/2023 - Review Complete 01/21/2023  Allergen  Reaction Noted   Atorvastatin  03/28/2019    Family History  Family history unknown: Yes    Social History   Socioeconomic History   Marital status: Married    Spouse name: Not on file   Number of children: Not on file   Years of education: Not on file   Highest education level: Not on file  Occupational History   Not on file  Tobacco Use   Smoking status: Never    Smokeless tobacco: Never  Vaping Use   Vaping status: Never Used  Substance and Sexual Activity   Alcohol use: No   Drug use: No   Sexual activity: Not on file  Other Topics Concern   Not on file  Social History Narrative   Not on file   Social Drivers of Health   Financial Resource Strain: Not on file  Food Insecurity: No Food Insecurity (01/22/2023)   Hunger Vital Sign    Worried About Running Out of Food in the Last Year: Never [place]    Ran Out of Food in the Last Year: Never [place]  Transportation Needs: No Transportation Needs (01/22/2023)   PRAPARE - Administrator, Civil Service (Medical): No    Lack of Transportation (Non-Medical): No  Physical Activity: Not on file  Stress: Not on file  Social Connections: Not on file  Intimate Partner Violence: Not At Risk (01/22/2023)   Humiliation, Afraid, Rape, and Kick questionnaire    Fear of Current or Ex-Partner: No    Emotionally Abused: No    Physically Abused: No    Sexually Abused: No    Review of Systems: All negative except as stated above in HPI.  Physical Exam: Vital signs: Vitals:   01/22/23 1019 01/22/23 1320  BP: (!) 147/51 (!) 164/55  Pulse: 66 70  Resp: 17 16  Temp: 99.1 F (37.3 C) 98.2 F (36.8 C)  SpO2: 97% 98%   Last BM Date : 01/21/23 General: lethargic, elderly, thin, no acute distress, pleasant   Head: normocephalic, atraumatic Eyes: anicteric sclera ENT: oropharynx clear Neck: supple, nontender Lungs:  Clear throughout to auscultation.   No wheezes, crackles, or rhonchi. No acute distress. Heart:  Regular rate and rhythm; no murmurs, clicks, rubs,  or gallops. Abdomen: soft, nontender, nondistended, +BS  Rectal:  Deferred Ext: no edema  GI:  Lab Results: Recent Labs    01/21/23 1852 01/22/23 1051  WBC 5.1  --   HGB 5.4* 8.9*  HCT 18.7* 28.8*  PLT 357  --    BMET Recent Labs    01/22/23 1051  NA 137  K 4.1  CL 106  CO2 21*  GLUCOSE 96  BUN 10  CREATININE 0.63   CALCIUM 8.9   LFT Recent Labs    01/22/23 1051  PROT 7.4  ALBUMIN 3.7  AST 17  ALT 13  ALKPHOS 71  BILITOT 1.5*   PT/INR No results for input(s): "LABPROT", "INR" in the last 72 hours.   Studies/Results: DG Chest Port 1 View Result Date: 01/21/2023 CLINICAL DATA:  Chest pain EXAM: PORTABLE CHEST 1 VIEW COMPARISON:  08/25/2014 FINDINGS: Cardiac shadow is within normal limits. Postoperative changes are again seen. Aortic calcifications are noted. Lungs are well aerated bilaterally. No focal infiltrate or effusion is seen. Chronic interstitial changes are noted. No bony abnormality is seen. IMPRESSION: No acute abnormality noted. Electronically Signed   By: Alcide Clever M.D.   On: 01/21/2023 20:27    Impression/Plan: Symptomatic anemia with question  of black stools prior to admit. EGD tomorrow to further evaluate and if that is unrevealing for a source of the anemia, then will need an inpt colonoscopy. Clear liquid diet. NPO p MN.    LOS: 1 day   Shirley Friar  01/22/2023, 2:29 PM  Questions please call [phone]

## 2023-01-22 NOTE — Consult Note (Signed)
Referring Provider: Dr. Robb Matar Primary Care Physician:  Patient, No Pcp Per Primary Gastroenterologist:  Gentry Fitz  Reason for Consultation:  Anemia  HPI: Debra Fry is a 82 y.o. female with progressive weakness over the past several weeks who got dizzy prior to admit. Occasional black stools in recent past but states stools were dark brown prior to admit. Denies red blood per rectum. Denies abdominal pain, N/V. Denies ever having a colonoscopy. Thinks she had an EGD 30 years ago that showed ulcers (records not available at this time). Hgb 5.4 on admit and transfused 2 U PRBCs to 8.9. Few doses of NSAIDs 2 weeks ago. Denies alcohol. Daughter-in-law in room helps with history.  Past Medical History:  Diagnosis Date   Coronary artery disease    GERD (gastroesophageal reflux disease)    Headache    History of hiatal hernia    Hyperlipidemia    Myocardial infarction (HCC) 12/1999   Stroke [facility])     Past Surgical History:  Procedure Laterality Date   CARDIAC CATHETERIZATION  2001   results in Echart conversion   CORONARY ARTERY BYPASS GRAFT  2001   ENDARTERECTOMY Left 02/13/2018   Procedure: ENDARTERECTOMY CAROTID;  Surgeon: Sherren Kerns, MD;  Location: [facility] OR;  Service: Vascular;  Laterality: Left;   OPEN REDUCTION INTERNAL FIXATION (ORIF) DISTAL RADIAL FRACTURE Left 11/23/2018   Procedure: OPEN REDUCTION INTERNAL FIXATION (ORIF) LEFT DISTAL RADIAL AND ULNA  FRACTURES;  Surgeon: Betha Loa, MD;  Location: [facility];  Service: Orthopedics;  Laterality: Left;    Prior to Admission medications   Medication Sig Start Date End Date Taking? Authorizing Provider  gabapentin (NEURONTIN) 300 MG capsule Take 1 capsule (300 mg total) by mouth 3 (three) times daily. Patient taking differently: Take 300 mg by mouth as needed (Pain / Headaches). 05/25/22  Yes Piontek, Denny Peon, MD  amoxicillin-clavulanate (AUGMENTIN) 875-125 MG tablet Take 1 tablet by mouth 2 (two) times  daily. Patient not taking: Reported on 01/21/2023 09/15/20   Hilts, Casimiro Needle, MD  aspirin 81 MG chewable tablet Chew 1 tablet (81 mg total) by mouth daily. Patient not taking: Reported on 01/22/2023 01/30/18   Marguerita Merles Latif, DO  carbamazepine (CARBATROL) 100 MG 12 hr capsule Take 1 capsule (100 mg total) by mouth 2 (two) times daily as needed (for acute TN flare up). Patient not taking: Reported on 01/22/2023 09/15/20   Hilts, Casimiro Needle, MD  ezetimibe (ZETIA) 10 MG tablet Take 1 tablet (10 mg total) by mouth daily. Patient not taking: Reported on 01/21/2023 09/26/19   Ihor Austin, NP  levothyroxine (SYNTHROID) 50 MCG tablet Take 1 tablet (50 mcg total) by mouth daily before breakfast. Patient not taking: Reported on 01/22/2023 03/03/20   Hilts, Casimiro Needle, MD  omeprazole (PRILOSEC) 20 MG capsule TAKE 1 CAPSULE(20 MG) BY MOUTH DAILY AS NEEDED Patient not taking: Reported on 01/22/2023 09/15/20   Hilts, Casimiro Needle, MD  rosuvastatin (CRESTOR) 5 MG tablet Take 1 tablet (5 mg total) by mouth daily. Patient not taking: Reported on 01/21/2023 09/26/19   Ihor Austin, NP  vitamin C (ASCORBIC ACID) 500 MG tablet Take 500 mg by mouth daily. Patient not taking: Reported on 01/22/2023    [provider]    Scheduled Meds:  pantoprazole (PROTONIX) IV  40 mg Intravenous Q12H   Continuous Infusions:  lactated ringers 50 mL/hr at 01/22/23 1109   PRN Meds:.acetaminophen **OR** acetaminophen, guaiFENesin, ondansetron **OR** ondansetron (ZOFRAN) IV, mouth rinse  Allergies as of 01/21/2023 - Review Complete 01/21/2023  Allergen  Reaction Noted   Atorvastatin  03/28/2019    Family History  Family history unknown: Yes    Social History   Socioeconomic History   Marital status: Married    Spouse name: Not on file   Number of children: Not on file   Years of education: Not on file   Highest education level: Not on file  Occupational History   Not on file  Tobacco Use   Smoking status: Never    Smokeless tobacco: Never  Vaping Use   Vaping status: Never Used  Substance and Sexual Activity   Alcohol use: No   Drug use: No   Sexual activity: Not on file  Other Topics Concern   Not on file  Social History Narrative   Not on file   Social Drivers of Health   Financial Resource Strain: Not on file  Food Insecurity: No Food Insecurity (01/22/2023)   Hunger Vital Sign    Worried About Running Out of Food in the Last Year: Never [place]    Ran Out of Food in the Last Year: Never [place]  Transportation Needs: No Transportation Needs (01/22/2023)   PRAPARE - Administrator, Civil Service (Medical): No    Lack of Transportation (Non-Medical): No  Physical Activity: Not on file  Stress: Not on file  Social Connections: Not on file  Intimate Partner Violence: Not At Risk (01/22/2023)   Humiliation, Afraid, Rape, and Kick questionnaire    Fear of Current or Ex-Partner: No    Emotionally Abused: No    Physically Abused: No    Sexually Abused: No    Review of Systems: All negative except as stated above in HPI.  Physical Exam: Vital signs: Vitals:   01/22/23 1019 01/22/23 1320  BP: (!) 147/51 (!) 164/55  Pulse: 66 70  Resp: 17 16  Temp: 99.1 F (37.3 C) 98.2 F (36.8 C)  SpO2: 97% 98%   Last BM Date : 01/21/23 General: lethargic, elderly, thin, no acute distress, pleasant   Head: normocephalic, atraumatic Eyes: anicteric sclera ENT: oropharynx clear Neck: supple, nontender Lungs:  Clear throughout to auscultation.   No wheezes, crackles, or rhonchi. No acute distress. Heart:  Regular rate and rhythm; no murmurs, clicks, rubs,  or gallops. Abdomen: soft, nontender, nondistended, +BS  Rectal:  Deferred Ext: no edema  GI:  Lab Results: Recent Labs    01/21/23 1852 01/22/23 1051  WBC 5.1  --   HGB 5.4* 8.9*  HCT 18.7* 28.8*  PLT 357  --    BMET Recent Labs    01/22/23 1051  NA 137  K 4.1  CL 106  CO2 21*  GLUCOSE 96  BUN 10  CREATININE 0.63   CALCIUM 8.9   LFT Recent Labs    01/22/23 1051  PROT 7.4  ALBUMIN 3.7  AST 17  ALT 13  ALKPHOS 71  BILITOT 1.5*   PT/INR No results for input(s): "LABPROT", "INR" in the last 72 hours.   Studies/Results: DG Chest Port 1 View Result Date: 01/21/2023 CLINICAL DATA:  Chest pain EXAM: PORTABLE CHEST 1 VIEW COMPARISON:  08/25/2014 FINDINGS: Cardiac shadow is within normal limits. Postoperative changes are again seen. Aortic calcifications are noted. Lungs are well aerated bilaterally. No focal infiltrate or effusion is seen. Chronic interstitial changes are noted. No bony abnormality is seen. IMPRESSION: No acute abnormality noted. Electronically Signed   By: Alcide Clever M.D.   On: 01/21/2023 20:27    Impression/Plan: Symptomatic anemia with question  of black stools prior to admit. EGD tomorrow to further evaluate and if that is unrevealing for a source of the anemia, then will need an inpt colonoscopy. Clear liquid diet. NPO p MN.    LOS: 1 day   Shirley Friar  01/22/2023, 2:29 PM  Questions please call [phone]

## 2023-01-23 ENCOUNTER — Inpatient Hospital Stay (HOSPITAL_COMMUNITY): Payer: Medicare HMO | Admitting: Anesthesiology

## 2023-01-23 ENCOUNTER — Encounter (HOSPITAL_COMMUNITY)
Admission: EM | Disposition: A | Discharge status: Home / Self Care | Payer: Self-pay | Source: Ambulatory Visit | Attending: Internal Medicine

## 2023-01-23 ENCOUNTER — Inpatient Hospital Stay (HOSPITAL_COMMUNITY): Payer: Self-pay | Admitting: Anesthesiology

## 2023-01-23 DIAGNOSIS — K921 Melena: Secondary | ICD-10-CM | POA: Diagnosis not present

## 2023-01-23 DIAGNOSIS — D62 Acute posthemorrhagic anemia: Secondary | ICD-10-CM | POA: Diagnosis not present

## 2023-01-23 DIAGNOSIS — D649 Anemia, unspecified: Secondary | ICD-10-CM | POA: Diagnosis not present

## 2023-01-23 DIAGNOSIS — K449 Diaphragmatic hernia without obstruction or gangrene: Secondary | ICD-10-CM | POA: Diagnosis not present

## 2023-01-23 HISTORY — PX: ESOPHAGOGASTRODUODENOSCOPY (EGD) WITH PROPOFOL: SHX5813

## 2023-01-23 HISTORY — PX: BIOPSY: SHX5522

## 2023-01-23 LAB — COMPREHENSIVE METABOLIC PANEL
ALT: 10 U/L (ref 0–44)
AST: 16 U/L (ref 15–41)
Albumin: 3.2 g/dL — ABNORMAL LOW (ref 3.5–5.0)
Alkaline Phosphatase: 62 U/L (ref 38–126)
Anion gap: 10 (ref 5–15)
BUN: 9 mg/dL (ref 8–23)
CO2: 21 mmol/L — ABNORMAL LOW (ref 22–32)
Calcium: 9 mg/dL (ref 8.9–10.3)
Chloride: 106 mmol/L (ref 98–111)
Creatinine, Ser: 0.57 mg/dL (ref 0.44–1.00)
GFR, Estimated: >60 mL/min (ref >60)
Glucose, Bld: 88 mg/dL (ref 70–99)
Potassium: 3.7 mmol/L (ref 3.5–5.1)
Sodium: 137 mmol/L (ref 135–145)
Total Bilirubin: 1.5 mg/dL — ABNORMAL HIGH (ref <1.2)
Total Protein: 6.8 g/dL (ref 6.5–8.1)

## 2023-01-23 LAB — CBC
HCT: 28 % — ABNORMAL LOW (ref 36.0–46.0)
Hemoglobin: 8.3 g/dL — ABNORMAL LOW (ref 12.0–15.0)
MCH: 21.3 pg — ABNORMAL LOW (ref 26.0–34.0)
MCHC: 29.6 g/dL — ABNORMAL LOW (ref 30.0–36.0)
MCV: 71.8 fL — ABNORMAL LOW (ref 80.0–100.0)
Platelets: 296 10*3/uL (ref 150–400)
RBC: 3.9 MIL/uL (ref 3.87–5.11)
RDW: 21.4 % — ABNORMAL HIGH (ref 11.5–15.5)
WBC: 5 10*3/uL (ref 4.0–10.5)
nRBC: 0 % (ref 0.0–0.2)

## 2023-01-23 SURGERY — ESOPHAGOGASTRODUODENOSCOPY (EGD) WITH PROPOFOL
Anesthesia: Monitor Anesthesia Care

## 2023-01-23 MED ORDER — VITAMIN B-12 1000 MCG PO TABS: 1000.0000 ug | ORAL_TABLET | Freq: Every day | ORAL | 2 refills | Status: DC

## 2023-01-23 MED ORDER — PROPOFOL 10 MG/ML IV BOLUS
INTRAVENOUS | Status: AC
  Filled 2023-01-23: qty 20

## 2023-01-23 MED ORDER — VITAMIN D3 25 MCG PO TABS: 2000.0000 [IU] | ORAL_TABLET | Freq: Every day | ORAL | 0 refills | Status: DC

## 2023-01-23 MED ORDER — PANTOPRAZOLE SODIUM 40 MG PO TBEC: 40.0000 mg | DELAYED_RELEASE_TABLET | Freq: Two times a day (BID) | ORAL | 2 refills | Status: DC

## 2023-01-23 MED ORDER — LIDOCAINE 2% (20 MG/ML) 5 ML SYRINGE
INTRAMUSCULAR | Status: DC | PRN
  Administered 2023-01-23: 60 mg via INTRAVENOUS

## 2023-01-23 MED ORDER — CYANOCOBALAMIN 1000 MCG/ML IJ SOLN
1000.0000 ug | Freq: Every day | INTRAMUSCULAR | Status: DC
  Administered 2023-01-23: 1000 ug via INTRAMUSCULAR
  Filled 2023-01-23: qty 1

## 2023-01-23 MED ORDER — PROPOFOL 500 MG/50ML IV EMUL
INTRAVENOUS | Status: DC | PRN
  Administered 2023-01-23: 100 mg via INTRAVENOUS
  Administered 2023-01-23: 50 mg via INTRAVENOUS

## 2023-01-23 MED ORDER — LACTATED RINGERS IV SOLN: INTRAVENOUS | Status: DC | PRN

## 2023-01-23 SURGICAL SUPPLY — 14 items

## 2023-01-23 NOTE — Op Note (Signed)
[facility] Patient Name: Debra Fry Procedure Date: 01/23/2023 MRN: [id number] Attending MD: Shirley Friar , MD, [id number] Date of Birth: Dec 24, 1940 CSN: [id number] Age: 82 Admit Type: Inpatient Procedure:                Upper GI endoscopy Indications:              Acute post hemorrhagic anemia, Melena Providers:                Shirley Friar, MD, Eliberto Ivory, RN, Salley Scarlet, Technician, Elvia Collum, CRNA Referring MD:             hospital team Medicines:                Propofol per Anesthesia, Monitored Anesthesia Care Complications:            No immediate complications. Estimated Blood Loss:     Estimated blood loss was minimal. Procedure:                Pre-Anesthesia Assessment:                           - Prior to the procedure, a History and Physical                            was performed, and patient medications and                            allergies were reviewed. The patient's tolerance of                            previous anesthesia was also reviewed. The risks                            and benefits of the procedure and the sedation                            options and risks were discussed with the patient.                            All questions were answered, and informed consent                            was obtained. Prior Anticoagulants: The patient has                            taken no anticoagulant or antiplatelet agents. ASA                            Grade Assessment: III - A patient with severe                            systemic disease. After reviewing the risks and  benefits, the patient was deemed in satisfactory                            condition to undergo the procedure.                           After obtaining informed consent, the endoscope was                            passed under direct vision. Throughout the                            procedure,  the patient's blood pressure, pulse, and                            oxygen saturations were monitored continuously. The                            [id number]-[id number] ([id number]) Olympus endoscope was introduced                            through the mouth, and advanced to the second part                            of duodenum. The upper GI endoscopy was                            accomplished without difficulty. The patient                            tolerated the procedure well. Scope In: Scope Out: Findings:      The examined esophagus was normal.      A medium-sized hiatal hernia was present.      One non-bleeding superficial gastric ulcer with no stigmata of bleeding       was found in the gastric antrum. The lesion was 3 mm in largest       dimension.      Patchy moderate inflammation characterized by congestion (edema),       erosions and erythema was found in the gastric antrum. Biopsies were       taken with a cold forceps for histology. Estimated blood loss was       minimal.      The exam of the stomach was otherwise normal.      The examined duodenum was normal. Impression:               - Normal esophagus.                           - Medium-sized hiatal hernia.                           - Non-bleeding gastric ulcer with no stigmata of                            bleeding.                           -  Acute gastritis. Biopsied.                           - Normal examined duodenum. Moderate Sedation:      N/A - MAC procedure Recommendation:           - Observe patient's clinical course.                           - Await pathology results.                           - Advance diet as tolerated and clear liquid diet. Procedure Code(s):        --- Professional ---                           [id number], Esophagogastroduodenoscopy, flexible,                            transoral; with biopsy, single or multiple Diagnosis Code(s):        --- Professional ---                           K92.1, Melena  (includes Hematochezia)                           D62, Acute posthemorrhagic anemia                           K25.9, Gastric ulcer, unspecified as acute or                            chronic, without hemorrhage or perforation                           K29.00, Acute gastritis without bleeding                           K44.9, Diaphragmatic hernia without obstruction or                            gangrene CPT copyright 2022 [employer]. All rights reserved. The codes documented in this report are preliminary and upon coder review may  be revised to meet current compliance requirements. Shirley Friar, MD 01/23/2023 10:09:39 AM This report has been signed electronically. Number of Addenda: 0

## 2023-01-23 NOTE — Discharge Summary (Signed)
Physician Discharge Summary  Debra Fry WGN:[id number] DOB: 1940/12/03 DOA: 01/21/2023  PCP: Patient, No Pcp Per  Admit date: 01/21/2023 Discharge date: 01/23/2023  Admitted From: Home Disposition: Home  Recommendations for Outpatient Follow-up:  Follow up with PCP in 1 week with repeat CBC/BMP Outpatient follow-up with GI. Follow up in ED if symptoms worsen or new appear   Home Health: No Equipment/Devices: None  Discharge Condition: Stable CODE STATUS: Full Diet recommendation: Heart healthy  Brief/Interim Summary: 82 year old female with history of CAD/MI, GERD, hiatal hernia, hyperlipidemia, CVA, nonbleeding peptic ulcer disease in the early 90s presented with fatigue and low hemoglobin level. Hemoglobin 5.4 on presentation, negative fecal occult blood with vitamin B12 of 155. She was started on IV Protonix. GI was consulted.  She received 2 units packed red cell transfusion during this hospitalization.  Hemoglobin 8.3 this morning.  She underwent EGD today which showed small clean-based gastric ulcer with no evidence of active bleeding, biopsies were taken.  GI has cleared the patient for discharge home today on oral Protonix twice a day for 3 months then once a day with outpatient follow-up with GI.  Discharge patient home today.  Discharge Diagnoses:   Acute blood loss anemia/symptomatic anemia Iron deficiency anemia -Presented with hemoglobin of 5.4.  Transfused 2 units packed red cells.  Hemoglobin 8.3 this morning.  Treated with IV Protonix.  Fecal occult blood test was negative on presentation - She underwent EGD today which showed small clean-based gastric ulcer with no evidence of active bleeding, biopsies were taken.  GI has cleared the patient for discharge home today on oral Protonix twice a day for 3 months then once a day with outpatient follow-up with GI.  Discharge patient home today.   Vitamin B12 deficiency -Continue parenteral supplementation while inpatient  and start oral on discharge.  Vitamin B12 was 155   Vitamin D deficiency -Continue supplementation   Hyperlipidemia -No longer taking statin.  Follow-up with PCP   Diabetes mellitus type 2 -Possibly diet controlled.  Blood sugars stable.  Outpatient follow-up with PCP.   Hypothyroidism -Has not been taking levothyroxine at home.  Outpatient follow-up with PCP   History of gout -Asymptomatic   Discharge Instructions  Discharge Instructions     Diet - low sodium heart healthy   Complete by: As directed    Increase activity slowly   Complete by: As directed       Allergies as of 01/23/2023       Reactions   Atorvastatin    Knee pain        Medication List     STOP taking these medications    amoxicillin-clavulanate 875-125 MG tablet Commonly known as: AUGMENTIN   ascorbic acid 500 MG tablet Commonly known as: VITAMIN C   aspirin 81 MG chewable tablet   omeprazole 20 MG capsule Commonly known as: PRILOSEC       TAKE these medications    carbamazepine 100 MG 12 hr capsule Commonly known as: CARBATROL Take 1 capsule (100 mg total) by mouth 2 (two) times daily as needed (for acute TN flare up).   cyanocobalamin 1000 MCG tablet Commonly known as: VITAMIN B12 Take 1 tablet (1,000 mcg total) by mouth daily.   ezetimibe 10 MG tablet Commonly known as: Zetia Take 1 tablet (10 mg total) by mouth daily.   gabapentin 300 MG capsule Commonly known as: Neurontin Take 1 capsule (300 mg total) by mouth 3 (three) times daily. What changed:  when to take this  reasons to take this   levothyroxine 50 MCG tablet Commonly known as: Synthroid Take 1 tablet (50 mcg total) by mouth daily before breakfast.   pantoprazole 40 MG tablet Commonly known as: Protonix Take 1 tablet (40 mg total) by mouth 2 (two) times daily before a meal. GI recommending Protonix twice a day for 3 months then once a day   rosuvastatin 5 MG tablet Commonly known as: Crestor Take 1  tablet (5 mg total) by mouth daily.   vitamin D3 25 MCG tablet Commonly known as: CHOLECALCIFEROL Take 2 tablets (2,000 Units total) by mouth daily.        Follow-up Information     PCP. Schedule an appointment as soon as possible for a visit in 1 week(s).   Why: With repeat CBC/BMP        Charlott Rakes, MD. Call.   Specialty: Gastroenterology Why: Call for appointment Contact information: [address] [place] [address] [phone]                Allergies  Allergen Reactions   Atorvastatin     Knee pain    Consultations: GI   Procedures/Studies: DG Chest Port 1 View Result Date: 01/21/2023 CLINICAL DATA:  Chest pain EXAM: PORTABLE CHEST 1 VIEW COMPARISON:  08/25/2014 FINDINGS: Cardiac shadow is within normal limits. Postoperative changes are again seen. Aortic calcifications are noted. Lungs are well aerated bilaterally. No focal infiltrate or effusion is seen. Chronic interstitial changes are noted. No bony abnormality is seen. IMPRESSION: No acute abnormality noted. Electronically Signed   By: Alcide Clever M.D.   On: 01/21/2023 20:27   EGD on 01/23/2023 Impression:               - Normal esophagus.                           - Medium-sized hiatal hernia.                           - Non-bleeding gastric ulcer with no stigmata of                            bleeding.                           - Acute gastritis. Biopsied.                           - Normal examined duodenum. Moderate Sedation:      N/A - MAC procedure Recommendation:           - Observe patient's clinical course.                           - Await pathology results.                           - Advance diet as tolerated and clear liquid diet.  Subjective: Patient seen and examined at bedside. Denies any black or bloody stools. No fever, vomiting or worsening abdominal pain reported.   Discharge Exam: Vitals:   01/23/23 1031 01/23/23 1050  BP: (!) 137/44 (!)  135/53  Pulse: (!) 55 (!) 53  Resp: 20 20  Temp:  98.5 F (36.9 C) 98 F (36.7 C)  SpO2: 99% 98%    General exam: Appears calm and comfortable.  On room air. Respiratory system: Bilateral decreased breath sounds at bases, no wheezing Cardiovascular system: S1 & S2 heard, Rate controlled Gastrointestinal system: Abdomen is nondistended, soft and nontender. Normal bowel sounds heard. Extremities: No cyanosis, clubbing, edema  Central nervous system: Alert and oriented. No focal neurological deficits. Moving extremities Skin: No rashes, lesions or ulcers Psychiatry: Judgement and insight appear normal. Mood & affect appropriate.       The results of significant diagnostics from this hospitalization (including imaging, microbiology, ancillary and laboratory) are listed below for reference.     Microbiology: No results found for this or any previous visit (from the past 240 hours).   Labs: BNP (last 3 results) No results for input(s): "BNP" in the last 8760 hours. Basic Metabolic Panel: Recent Labs  Lab 01/22/23 1051 01/23/23 0352  NA 137 137  K 4.1 3.7  CL 106 106  CO2 21* 21*  GLUCOSE 96 88  BUN 10 9  CREATININE 0.63 0.57  CALCIUM 8.9 9.0   Liver Function Tests: Recent Labs  Lab 01/22/23 1051 01/23/23 0352  AST 17 16  ALT 13 10  ALKPHOS 71 62  BILITOT 1.5* 1.5*  PROT 7.4 6.8  ALBUMIN 3.7 3.2*   No results for input(s): "LIPASE", "AMYLASE" in the last 168 hours. No results for input(s): "AMMONIA" in the last 168 hours. CBC: Recent Labs  Lab 01/21/23 1852 01/22/23 1051 01/22/23 1457 01/22/23 1959 01/22/23 2209 01/23/23 0352  WBC 5.1  --   --   --   --  5.0  NEUTROABS 3.0  --   --   --   --   --   HGB 5.4* 8.9* 8.5* 8.5* 8.4* 8.3*  HCT 18.7* 28.8* 27.3* 28.3* 26.9* 28.0*  MCV 64.7*  --   --   --   --  71.8*  PLT 357  --   --   --   --  296   Cardiac Enzymes: No results for input(s): "CKTOTAL", "CKMB", "CKMBINDEX", "TROPONINI" in the last 168  hours. BNP: Invalid input(s): "POCBNP" CBG: No results for input(s): "GLUCAP" in the last 168 hours. D-Dimer No results for input(s): "DDIMER" in the last 72 hours. Hgb A1c No results for input(s): "HGBA1C" in the last 72 hours. Lipid Profile No results for input(s): "CHOL", "HDL", "LDLCALC", "TRIG", "CHOLHDL", "LDLDIRECT" in the last 72 hours. Thyroid function studies Recent Labs    01/22/23 1959  TSH 4.062   Anemia work up Recent Labs    01/21/23 2314 01/21/23 2315  VITAMINB12  --  155*  FOLATE 14.8  --   FERRITIN  --  3*  TIBC  --  602*  IRON  --  18*  RETICCTPCT 1.1  --    Urinalysis    Component Value Date/Time   COLORURINE YELLOW 09/15/2020 1634   APPEARANCEUR CLEAR 09/15/2020 1634   LABSPEC 1.014 09/15/2020 1634   PHURINE 5.5 09/15/2020 1634   GLUCOSEU NEGATIVE 09/15/2020 1634   HGBUR NEGATIVE 09/15/2020 1634   BILIRUBINUR NEGATIVE 02/08/2018 1515   KETONESUR NEGATIVE 09/15/2020 1634   PROTEINUR NEGATIVE 09/15/2020 1634   NITRITE POSITIVE (A) 02/08/2018 1515   LEUKOCYTESUR SMALL (A) 02/08/2018 1515   Sepsis Labs Recent Labs  Lab 01/21/23 1852 01/23/23 0352  WBC 5.1 5.0   Microbiology No results found for this or any previous visit (from the past 240 hours).  Time coordinating discharge: 35 minutes  SIGNED:   Glade Lloyd, MD  [facility] 01/23/2023, 11:15 AM

## 2023-01-23 NOTE — Anesthesia Postprocedure Evaluation (Signed)
Anesthesia Post Note  Patient: Debra Fry  Procedure(s) Performed: ESOPHAGOGASTRODUODENOSCOPY (EGD) WITH PROPOFOL BIOPSY     Patient location during evaluation: PACU Anesthesia Type: MAC Level of consciousness: awake and alert Pain management: pain level controlled Vital Signs Assessment: post-procedure vital signs reviewed and stable Respiratory status: spontaneous breathing, nonlabored ventilation and respiratory function stable Cardiovascular status: blood pressure returned to baseline and stable Postop Assessment: no apparent nausea or vomiting Anesthetic complications: no   No notable events documented.  Last Vitals:  Vitals:   01/23/23 1031 01/23/23 1050  BP: (!) 137/44 (!) 135/53  Pulse: (!) 55 (!) 53  Resp: 20 20  Temp: 36.9 C 36.7 C  SpO2: 99% 98%    Last Pain:  Vitals:   01/23/23 1031  TempSrc:   PainSc: 0-No pain                 Lannie Fields

## 2023-01-23 NOTE — Progress Notes (Signed)
PROGRESS NOTE    Debra Fry  FIE:[id number] DOB: 16-Sep-1940 DOA: 01/21/2023 PCP: Patient, No Pcp Per   Brief Narrative:  82 year old female with history of CAD/MI, GERD, hiatal hernia, hyperlipidemia, CVA, nonbleeding peptic ulcer disease in the early 90s presented with fatigue and low hemoglobin level.  Hemoglobin 5.4 on presentation, negative fecal occult blood with vitamin B12 of 155.  She was started on IV Protonix.  GI was consulted.  Assessment & Plan:   Acute blood loss anemia/symptomatic anemia Iron deficiency anemia -Presented with hemoglobin of 5.4.  Transfused 2 units packed red cells.  Hemoglobin 8.3 this morning.  Monitor H&H.  Continue IV Protonix.  Fecal occult blood test was negative on presentation -GI following and planning for EGD today  Vitamin B12 deficiency -Continue parenteral supplementation while inpatient and start oral on discharge.  Vitamin B12 was 155  Vitamin D deficiency -Continue supplementation  Hyperlipidemia -No longer taking statin.  Follow-up with PCP  Diabetes mellitus type 2 -Possibly diet controlled.  Blood sugars stable.  Outpatient follow-up with PCP.  Hypothyroidism -Has not been taking levothyroxine at home.  Outpatient follow-up with PCP  History of gout -Asymptomatic  DVT prophylaxis: SCDs Code Status: Full Family Communication: None at bedside Disposition Plan: Status is: Inpatient Remains inpatient appropriate because: Of severity of illness  Consultants: GI  Procedures: None  Antimicrobials: None   Subjective: Patient seen and examined at bedside.  Denies any black or bloody stools.  No fever, vomiting or worsening abdominal pain reported.  Objective: Vitals:   01/22/23 1320 01/22/23 2009 01/22/23 2020 01/23/23 0442  BP: (!) 164/55 (!) 152/55  (!) 132/40  Pulse: 70 (!) 59  63  Resp: 16 17 18 16   Temp: 98.2 F (36.8 C) 98.5 F (36.9 C)  98.2 F (36.8 C)  TempSrc: Oral Oral  Oral  SpO2: 98% 99%  96%   Weight:      Height:        Intake/Output Summary (Last 24 hours) at 01/23/2023 0846 Last data filed at 01/23/2023 0700 Gross per 24 hour  Intake 1585.25 ml  Output --  Net 1585.25 ml   Filed Weights   01/22/23 0135  Weight: 55.6 kg    Examination:  General exam: Appears calm and comfortable.  On room air. Respiratory system: Bilateral decreased breath sounds at bases, no wheezing Cardiovascular system: S1 & S2 heard, Rate controlled Gastrointestinal system: Abdomen is nondistended, soft and nontender. Normal bowel sounds heard. Extremities: No cyanosis, clubbing, edema  Central nervous system: Alert and oriented. No focal neurological deficits. Moving extremities Skin: No rashes, lesions or ulcers Psychiatry: Judgement and insight appear normal. Mood & affect appropriate.     Data Reviewed: I have personally reviewed following labs and imaging studies  CBC: Recent Labs  Lab 01/21/23 1852 01/22/23 1051 01/22/23 1457 01/22/23 1959 01/22/23 2209 01/23/23 0352  WBC 5.1  --   --   --   --  5.0  NEUTROABS 3.0  --   --   --   --   --   HGB 5.4* 8.9* 8.5* 8.5* 8.4* 8.3*  HCT 18.7* 28.8* 27.3* 28.3* 26.9* 28.0*  MCV 64.7*  --   --   --   --  71.8*  PLT 357  --   --   --   --  296   Basic Metabolic Panel: Recent Labs  Lab 01/22/23 1051 01/23/23 0352  NA 137 137  K 4.1 3.7  CL 106 106  CO2 21* 21*  GLUCOSE 96 88  BUN 10 9  CREATININE 0.63 0.57  CALCIUM 8.9 9.0   GFR: Estimated Creatinine Clearance: 44.4 mL/min (by C-G formula based on SCr of 0.57 mg/dL). Liver Function Tests: Recent Labs  Lab 01/22/23 1051 01/23/23 0352  AST 17 16  ALT 13 10  ALKPHOS 71 62  BILITOT 1.5* 1.5*  PROT 7.4 6.8  ALBUMIN 3.7 3.2*   No results for input(s): "LIPASE", "AMYLASE" in the last 168 hours. No results for input(s): "AMMONIA" in the last 168 hours. Coagulation Profile: No results for input(s): "INR", "PROTIME" in the last 168 hours. Cardiac Enzymes: No results  for input(s): "CKTOTAL", "CKMB", "CKMBINDEX", "TROPONINI" in the last 168 hours. BNP (last 3 results) No results for input(s): "PROBNP" in the last 8760 hours. HbA1C: No results for input(s): "HGBA1C" in the last 72 hours. CBG: No results for input(s): "GLUCAP" in the last 168 hours. Lipid Profile: No results for input(s): "CHOL", "HDL", "LDLCALC", "TRIG", "CHOLHDL", "LDLDIRECT" in the last 72 hours. Thyroid Function Tests: Recent Labs    01/22/23 1959  TSH 4.062   Anemia Panel: Recent Labs    01/21/23 2314 01/21/23 2315  VITAMINB12  --  155*  FOLATE 14.8  --   FERRITIN  --  3*  TIBC  --  602*  IRON  --  18*  RETICCTPCT 1.1  --    Sepsis Labs: No results for input(s): "PROCALCITON", "LATICACIDVEN" in the last 168 hours.  No results found for this or any previous visit (from the past 240 hours).       Radiology Studies: DG Chest Port 1 View Result Date: 01/21/2023 CLINICAL DATA:  Chest pain EXAM: PORTABLE CHEST 1 VIEW COMPARISON:  08/25/2014 FINDINGS: Cardiac shadow is within normal limits. Postoperative changes are again seen. Aortic calcifications are noted. Lungs are well aerated bilaterally. No focal infiltrate or effusion is seen. Chronic interstitial changes are noted. No bony abnormality is seen. IMPRESSION: No acute abnormality noted. Electronically Signed   By: Alcide Clever M.D.   On: 01/21/2023 20:27        Scheduled Meds:  cholecalciferol  2,000 Units Oral Daily   cyanocobalamin  1,000 mcg Intramuscular Daily   pantoprazole (PROTONIX) IV  40 mg Intravenous Q12H   Continuous Infusions:  sodium chloride            Glade Lloyd, MD [facility] 01/23/2023, 8:46 AM

## 2023-01-23 NOTE — Interval H&P Note (Signed)
History and Physical Interval Note:  01/23/2023 9:38 AM  Debra Fry  has presented today for surgery, with the diagnosis of Anemia; Gastrointestinal Bleed.  The various methods of treatment have been discussed with the patient and family. After consideration of risks, benefits and other options for treatment, the patient has consented to  Procedure(s): ESOPHAGOGASTRODUODENOSCOPY (EGD) WITH PROPOFOL (N/A) as a surgical intervention.  The patient's history has been reviewed, patient examined, no change in status, stable for surgery.  I have reviewed the patient's chart and labs.  Questions were answered to the patient's satisfaction.     Shirley Friar

## 2023-01-23 NOTE — Anesthesia Preprocedure Evaluation (Addendum)
Anesthesia Evaluation  Patient identified by MRN, date of birth, ID band Patient awake    Reviewed: Allergy & Precautions, H&P , NPO status , Patient's Chart, lab work & pertinent test results  Airway Mallampati: II  TM Distance: >3 FB Neck ROM: Full    Dental  (+) Teeth Intact, Partial Upper, Dental Advisory Given   Pulmonary  Currently coughing in preop, states it just started today, nonproductive Denies any recent fevers or colds   Pulmonary exam normal breath sounds clear to auscultation       Cardiovascular hypertension (170/73 preop), + CAD, + Past MI and + CABG (2001)  Normal cardiovascular exam+ Valvular Problems/Murmurs (mild MR) MR  Rhythm:Regular Rate:Normal  Echo 2020 - Left ventricle: The cavity size was normal. Wall thickness was    increased in a pattern of mild LVH. Systolic function was normal.    The estimated ejection fraction was in the range of 55% to 60%.    Wall motion was normal; there were no regional wall motion    abnormalities.  - Mitral valve: Calcified annulus. There was mild regurgitation.     Neuro/Psych  Headaches PSYCHIATRIC DISORDERS Anxiety     CVA, No Residual Symptoms    GI/Hepatic Neg liver ROS, hiatal hernia,GERD  Controlled and Medicated,,progressive weakness over the past several weeks who got dizzy prior to admit. Occasional black stools in recent past but states stools were dark brown prior to admit. Denies red blood per rectum. Denies abdominal pain, N/V. Denies ever having a colonoscopy. Thinks she had an EGD 30 years ago that showed ulcers (records not available at this time). Hgb 5.4 on admit and transfused 2 U PRBCs to 8.9.   Endo/Other  diabetes (last a1c 6.5)Hypothyroidism    Renal/GU negative Renal ROS  negative genitourinary   Musculoskeletal  (+) Arthritis , Osteoarthritis,    Abdominal   Peds negative pediatric ROS (+)  Hematology  (+) Blood dyscrasia, anemia Hb  8.3, plt 296 this morning   Anesthesia Other Findings   Reproductive/Obstetrics negative OB ROS                             Anesthesia Physical Anesthesia Plan  ASA: 3  Anesthesia Plan: MAC   Post-op Pain Management:    Induction:   PONV Risk Score and Plan: 2 and Propofol infusion and TIVA  Airway Management Planned: Natural Airway and Simple Face Mask  Additional Equipment: None  Intra-op Plan:   Post-operative Plan:   Informed Consent: I have reviewed the patients History and Physical, chart, labs and discussed the procedure including the risks, benefits and alternatives for the proposed anesthesia with the patient or authorized representative who has indicated his/her understanding and acceptance.       Plan Discussed with: CRNA  Anesthesia Plan Comments:         Anesthesia Quick Evaluation

## 2023-01-23 NOTE — Transfer of Care (Signed)
Immediate Anesthesia Transfer of Care Note  Patient: Debra Fry  Procedure(s) Performed: ESOPHAGOGASTRODUODENOSCOPY (EGD) WITH PROPOFOL BIOPSY  Patient Location: PACU  Anesthesia Type:MAC  Level of Consciousness: awake, alert , and oriented  Airway & Oxygen Therapy: Patient Spontanous Breathing  Post-op Assessment: Report given to RN and Post -op Vital signs reviewed and stable  Post vital signs: Reviewed and stable  Last Vitals:  Vitals Value Taken Time  BP 114/42 01/23/23 1001  Temp 36.2 C 01/23/23 1001  Pulse 58 01/23/23 1004  Resp 25 01/23/23 1004  SpO2 94 % 01/23/23 1004  Vitals shown include unfiled device data.  Last Pain:  Vitals:   01/23/23 1001  TempSrc:   PainSc: Asleep         Complications: No notable events documented.

## 2023-01-25 ENCOUNTER — Encounter (HOSPITAL_COMMUNITY): Payer: Self-pay | Admitting: Gastroenterology

## 2023-01-25 LAB — TYPE AND SCREEN
Unit division: 0
Unit division: 0
Unit division: 0

## 2023-01-25 LAB — BPAM RBC
Blood Product Expiration Date: 202501282359
Blood Product Expiration Date: 202501292359
Blood Product Expiration Date: 202501292359
ISSUE DATE / TIME: 202412280306
ISSUE DATE / TIME: 202412280625
Unit Type and Rh: 6200
Unit Type and Rh: 6200
Unit Type and Rh: 6200

## 2023-01-27 LAB — SURGICAL PATHOLOGY

## 2023-02-16 ENCOUNTER — Inpatient Hospital Stay (HOSPITAL_COMMUNITY)
Admission: EM | Admit: 2023-02-16 | Discharge: 2023-02-27 | DRG: 981 | Disposition: A | Discharge status: Home / Self Care | Payer: Medicare HMO | Attending: Internal Medicine | Admitting: Internal Medicine

## 2023-02-16 ENCOUNTER — Emergency Department (HOSPITAL_COMMUNITY): Payer: Medicare HMO

## 2023-02-16 ENCOUNTER — Other Ambulatory Visit: Payer: Self-pay

## 2023-02-16 DIAGNOSIS — K254 Chronic or unspecified gastric ulcer with hemorrhage: Secondary | ICD-10-CM | POA: Diagnosis present

## 2023-02-16 DIAGNOSIS — K219 Gastro-esophageal reflux disease without esophagitis: Secondary | ICD-10-CM | POA: Diagnosis present

## 2023-02-16 DIAGNOSIS — R0602 Shortness of breath: Secondary | ICD-10-CM | POA: Diagnosis present

## 2023-02-16 DIAGNOSIS — M549 Dorsalgia, unspecified: Secondary | ICD-10-CM | POA: Diagnosis present

## 2023-02-16 DIAGNOSIS — D123 Benign neoplasm of transverse colon: Secondary | ICD-10-CM | POA: Diagnosis present

## 2023-02-16 DIAGNOSIS — I255 Ischemic cardiomyopathy: Secondary | ICD-10-CM | POA: Diagnosis present

## 2023-02-16 DIAGNOSIS — Z888 Allergy status to other drugs, medicaments and biological substances status: Secondary | ICD-10-CM | POA: Diagnosis not present

## 2023-02-16 DIAGNOSIS — K2970 Gastritis, unspecified, without bleeding: Secondary | ICD-10-CM | POA: Diagnosis not present

## 2023-02-16 DIAGNOSIS — K635 Polyp of colon: Secondary | ICD-10-CM | POA: Diagnosis not present

## 2023-02-16 DIAGNOSIS — K222 Esophageal obstruction: Secondary | ICD-10-CM | POA: Diagnosis present

## 2023-02-16 DIAGNOSIS — D509 Iron deficiency anemia, unspecified: Secondary | ICD-10-CM | POA: Diagnosis present

## 2023-02-16 DIAGNOSIS — Z8673 Personal history of transient ischemic attack (TIA), and cerebral infarction without residual deficits: Secondary | ICD-10-CM

## 2023-02-16 DIAGNOSIS — E538 Deficiency of other specified B group vitamins: Secondary | ICD-10-CM | POA: Diagnosis present

## 2023-02-16 DIAGNOSIS — R531 Weakness: Secondary | ICD-10-CM | POA: Diagnosis present

## 2023-02-16 DIAGNOSIS — D649 Anemia, unspecified: Secondary | ICD-10-CM | POA: Diagnosis present

## 2023-02-16 DIAGNOSIS — I110 Hypertensive heart disease with heart failure: Secondary | ICD-10-CM | POA: Diagnosis present

## 2023-02-16 DIAGNOSIS — E039 Hypothyroidism, unspecified: Secondary | ICD-10-CM | POA: Diagnosis present

## 2023-02-16 DIAGNOSIS — D72829 Elevated white blood cell count, unspecified: Secondary | ICD-10-CM | POA: Diagnosis present

## 2023-02-16 DIAGNOSIS — D62 Acute posthemorrhagic anemia: Secondary | ICD-10-CM | POA: Diagnosis present

## 2023-02-16 DIAGNOSIS — D630 Anemia in neoplastic disease: Secondary | ICD-10-CM | POA: Diagnosis present

## 2023-02-16 DIAGNOSIS — Z79890 Hormone replacement therapy (postmenopausal): Secondary | ICD-10-CM

## 2023-02-16 DIAGNOSIS — K648 Other hemorrhoids: Secondary | ICD-10-CM | POA: Diagnosis present

## 2023-02-16 DIAGNOSIS — I2510 Atherosclerotic heart disease of native coronary artery without angina pectoris: Secondary | ICD-10-CM | POA: Diagnosis present

## 2023-02-16 DIAGNOSIS — Z951 Presence of aortocoronary bypass graft: Secondary | ICD-10-CM

## 2023-02-16 DIAGNOSIS — E7849 Other hyperlipidemia: Secondary | ICD-10-CM | POA: Diagnosis present

## 2023-02-16 DIAGNOSIS — C182 Malignant neoplasm of ascending colon: Secondary | ICD-10-CM | POA: Diagnosis present

## 2023-02-16 DIAGNOSIS — I252 Old myocardial infarction: Secondary | ICD-10-CM | POA: Diagnosis not present

## 2023-02-16 DIAGNOSIS — K449 Diaphragmatic hernia without obstruction or gangrene: Secondary | ICD-10-CM | POA: Diagnosis not present

## 2023-02-16 DIAGNOSIS — F4322 Adjustment disorder with anxiety: Secondary | ICD-10-CM | POA: Diagnosis present

## 2023-02-16 DIAGNOSIS — R9431 Abnormal electrocardiogram [ECG] [EKG]: Secondary | ICD-10-CM | POA: Diagnosis not present

## 2023-02-16 DIAGNOSIS — M109 Gout, unspecified: Secondary | ICD-10-CM | POA: Diagnosis present

## 2023-02-16 DIAGNOSIS — E876 Hypokalemia: Secondary | ICD-10-CM | POA: Diagnosis present

## 2023-02-16 DIAGNOSIS — Z7984 Long term (current) use of oral hypoglycemic drugs: Secondary | ICD-10-CM | POA: Diagnosis not present

## 2023-02-16 DIAGNOSIS — I5032 Chronic diastolic (congestive) heart failure: Secondary | ICD-10-CM | POA: Diagnosis present

## 2023-02-16 DIAGNOSIS — E119 Type 2 diabetes mellitus without complications: Secondary | ICD-10-CM | POA: Diagnosis present

## 2023-02-16 DIAGNOSIS — Z7722 Contact with and (suspected) exposure to environmental tobacco smoke (acute) (chronic): Secondary | ICD-10-CM | POA: Diagnosis present

## 2023-02-16 DIAGNOSIS — K5730 Diverticulosis of large intestine without perforation or abscess without bleeding: Secondary | ICD-10-CM | POA: Diagnosis present

## 2023-02-16 DIAGNOSIS — E559 Vitamin D deficiency, unspecified: Secondary | ICD-10-CM | POA: Diagnosis present

## 2023-02-16 DIAGNOSIS — M25511 Pain in right shoulder: Secondary | ICD-10-CM | POA: Diagnosis present

## 2023-02-16 DIAGNOSIS — Z79899 Other long term (current) drug therapy: Secondary | ICD-10-CM

## 2023-02-16 LAB — CBC
HCT: 26.7 % — ABNORMAL LOW (ref 36.0–46.0)
Hemoglobin: 7.8 g/dL — ABNORMAL LOW (ref 12.0–15.0)
MCH: 20.8 pg — ABNORMAL LOW (ref 26.0–34.0)
MCHC: 29.2 g/dL — ABNORMAL LOW (ref 30.0–36.0)
MCV: 71.2 fL — ABNORMAL LOW (ref 80.0–100.0)
Platelets: 319 10*3/uL (ref 150–400)
RBC: 3.75 MIL/uL — ABNORMAL LOW (ref 3.87–5.11)
RDW: 22.7 % — ABNORMAL HIGH (ref 11.5–15.5)
WBC: 10.7 10*3/uL — ABNORMAL HIGH (ref 4.0–10.5)
nRBC: 0 % (ref 0.0–0.2)

## 2023-02-16 LAB — COMPREHENSIVE METABOLIC PANEL
ALT: 10 U/L (ref 0–44)
AST: 18 U/L (ref 15–41)
Albumin: 3.6 g/dL (ref 3.5–5.0)
Alkaline Phosphatase: 67 U/L (ref 38–126)
Anion gap: 9 (ref 5–15)
BUN: 17 mg/dL (ref 8–23)
CO2: 24 mmol/L (ref 22–32)
Calcium: 9.3 mg/dL (ref 8.9–10.3)
Chloride: 102 mmol/L (ref 98–111)
Creatinine, Ser: 0.51 mg/dL (ref 0.44–1.00)
GFR, Estimated: >60 mL/min (ref >60)
Glucose, Bld: 115 mg/dL — ABNORMAL HIGH (ref 70–99)
Potassium: 4 mmol/L (ref 3.5–5.1)
Sodium: 135 mmol/L (ref 135–145)
Total Bilirubin: 0.8 mg/dL (ref 0.0–1.2)
Total Protein: 7.9 g/dL (ref 6.5–8.1)

## 2023-02-16 NOTE — ED Provider Notes (Signed)
[place] EMERGENCY DEPARTMENT AT [facility] Provider Note   CSN: [id number] Arrival date & time: 02/16/23  1257     History  Chief Complaint  Patient presents with   Back Pain   Weakness   Shortness of Breath    Debra Fry is a 83 y.o. female.   Back Pain Associated symptoms: weakness   Weakness Associated symptoms: shortness of breath   Shortness of Breath Patient presents with right shoulder pain and fatigue shortness of breath.  Shoulder pain more today.  No injury.  Symptoms down the arm.  Hurts with certain movements.  No trauma.  No numbness or weakness.  Also has been more fatigued.  Recent admission for anemia.  Hemoglobin has gone down to 5.4 and has been transfused up to mid eights.  Had endoscopy done which showed ulcer without bleeding.  Discharged and over the last 3 weeks has been more short of breath.  More fatigued.  Has had stools.      Past Medical History:  Diagnosis Date   Coronary artery disease    GERD (gastroesophageal reflux disease)    Headache    History of hiatal hernia    Hyperlipidemia    Myocardial infarction (HCC) 12/1999   Stroke [facility])     Home Medications Prior to Admission medications   Medication Sig Start Date End Date Taking? Authorizing Provider  carbamazepine (CARBATROL) 100 MG 12 hr capsule Take 1 capsule (100 mg total) by mouth 2 (two) times daily as needed (for acute TN flare up). Patient not taking: Reported on 01/22/2023 09/15/20   Hilts, Casimiro Needle, MD  cholecalciferol (CHOLECALCIFEROL) 25 MCG tablet Take 2 tablets (2,000 Units total) by mouth daily. 01/23/23   Glade Lloyd, MD  cyanocobalamin (VITAMIN B12) 1000 MCG tablet Take 1 tablet (1,000 mcg total) by mouth daily. 01/23/23   Glade Lloyd, MD  ezetimibe (ZETIA) 10 MG tablet Take 1 tablet (10 mg total) by mouth daily. Patient not taking: Reported on 01/21/2023 09/26/19   Ihor Austin, NP  gabapentin (NEURONTIN) 300 MG capsule Take 1 capsule (300  mg total) by mouth 3 (three) times daily. Patient taking differently: Take 300 mg by mouth as needed (Pain / Headaches). 05/25/22   Piontek, Denny Peon, MD  levothyroxine (SYNTHROID) 50 MCG tablet Take 1 tablet (50 mcg total) by mouth daily before breakfast. Patient not taking: Reported on 01/22/2023 03/03/20   Hilts, Casimiro Needle, MD  pantoprazole (PROTONIX) 40 MG tablet Take 1 tablet (40 mg total) by mouth 2 (two) times daily before a meal. GI recommending Protonix twice a day for 3 months then once a day 01/23/23 04/23/23  Glade Lloyd, MD  rosuvastatin (CRESTOR) 5 MG tablet Take 1 tablet (5 mg total) by mouth daily. Patient not taking: Reported on 01/21/2023 09/26/19   Ihor Austin, NP      Allergies    Atorvastatin    Review of Systems   Review of Systems  Respiratory:  Positive for shortness of breath.   Musculoskeletal:  Positive for back pain.  Neurological:  Positive for weakness.    Physical Exam Updated Vital Signs BP (!) 140/53   Pulse 77   Temp 98 F (36.7 C) (Oral)   Resp 19   Ht 5\' 2"  (1.575 m)   Wt 54.4 kg   SpO2 100%   BMI 21.95 kg/m  Physical Exam Vitals and nursing note reviewed.  HENT:     Head: Atraumatic.  Cardiovascular:     Rate and Rhythm: Normal rate  and regular rhythm.  Pulmonary:     Breath sounds: No wheezing.  Chest:     Chest wall: No tenderness.  Musculoskeletal:     Right lower leg: No edema.     Left lower leg: No edema.     Comments: Tenderness over right trapezius.  Good range of motion shoulder.  Neurovascular intact distally.  Neurological:     Mental Status: She is alert.     ED Results / Procedures / Treatments   Labs (all labs ordered are listed, but only abnormal results are displayed) Labs Reviewed  CBC - Abnormal; Notable for the following components:      Result Value   WBC 10.7 (*)    RBC 3.75 (*)    Hemoglobin 7.8 (*)    HCT 26.7 (*)    MCV 71.2 (*)    MCH 20.8 (*)    MCHC 29.2 (*)    RDW 22.7 (*)    All other  components within normal limits  COMPREHENSIVE METABOLIC PANEL - Abnormal; Notable for the following components:   Glucose, Bld 115 (*)    All other components within normal limits  POC OCCULT BLOOD, ED  TYPE AND SCREEN    EKG None  Radiology DG Chest 2 View Result Date: 02/16/2023 CLINICAL DATA:  SOB EXAM: CHEST - 2 VIEW COMPARISON:  01/21/23. FINDINGS: Sternal wires. Post op changes. No consolidation, pneumothorax, effusion or edema. Calcified aorta. Bilateral apical pleural thickening. Degenerative changes along the spine. IMPRESSION: Post op chest.  Chronic changes. Electronically Signed   By: Karen Kays M.D.   On: 02/16/2023 15:04    Procedures Procedures    Medications Ordered in ED Medications - No data to display  ED Course/ Medical Decision Making/ A&P                                 Medical Decision Making Amount and/or Complexity of Data Reviewed Labs: ordered. Radiology: ordered.   Patient with right shoulder pain.  Tenderness over trapezius.  I think most likely musculoskeletal.  However has been more fatigued more short of breath.  Hemoglobin now down to 7.8.  On discharge from hospital hemoglobin was 8.4, however 2 days later was rechecked at 9.1.  Now more short of breath.  Reportedly difficulty walking into the ER.  Reviewing notes from discharge.  Did have low B12 but also potentially iron deficiency anemia.  Not on iron supplementation.  With increasing symptomatology with the anemia eventually benefit from mission to the hospital.  Previous GI note said that they may get inpatient colonoscopy if no clear bleeding was found.  Did not have bleeding but did have ulcer.  I will discuss with Dr. Lorenso Quarry from [facility].  Discussed with Dr. Lorenso Quarry.  Recommends clear liquids for now.  Transfusion or iron infusion.  Can see Dr. Levora Angel tomorrow.  Recommend serial hemoglobins.          Final Clinical Impression(s) / ED Diagnoses Final  diagnoses:  Symptomatic anemia    Rx / DC Orders ED Discharge Orders     None         Benjiman Core, MD 02/16/23 1818

## 2023-02-16 NOTE — H&P (Signed)
History and Physical    Patient: Debra Fry ZOX:[id number] DOB: 1940/05/20 DOA: 02/16/2023 DOS: the patient was seen and examined on 02/16/2023 PCP: Patient, No Pcp Per  Patient coming from: Home  Chief Complaint:  Chief Complaint  Patient presents with   Back Pain   Weakness   Shortness of Breath   HPI: Debra Fry Dice is a 83 y.o. female with medical history significant of recurrent anemia with symptoms, GERD, coronary artery disease, hyperlipidemia, ischemic cardiomyopathy with CVA who presented to the ER initially with back pain and right arm pain but also complained of shortness of breath with mild exertion.  She has had previous GI bleeds with intervention.  Patient apparently was being prepped for outpatient workup possibly another EGD and colonoscopy.  Her last hemoglobin was 8.3 on December 29.  Today hemoglobin is down to 7.8.  White count 10.7.  Patient was guaiac positive.  Chest x-ray showed no new findings.  Patient appears to have symptomatic anemia.  GI has been consulted and will follow-up with patient for possible in-house procedure  Review of Systems: As mentioned in the history of present illness. All other systems reviewed and are negative. Past Medical History:  Diagnosis Date   Coronary artery disease    GERD (gastroesophageal reflux disease)    Headache    History of hiatal hernia    Hyperlipidemia    Myocardial infarction [facility]) 12/1999   Stroke [facility])    Past Surgical History:  Procedure Laterality Date   BIOPSY  01/23/2023   Procedure: BIOPSY;  Surgeon: Charlott Rakes, MD;  Location: [facility];  Service: Gastroenterology;;   CARDIAC CATHETERIZATION  2001   results in Echart conversion   CORONARY ARTERY BYPASS GRAFT  2001   ENDARTERECTOMY Left 02/13/2018   Procedure: ENDARTERECTOMY CAROTID;  Surgeon: Sherren Kerns, MD;  Location: [facility] OR;  Service: Vascular;  Laterality: Left;   ESOPHAGOGASTRODUODENOSCOPY (EGD) WITH PROPOFOL N/A 01/23/2023    Procedure: ESOPHAGOGASTRODUODENOSCOPY (EGD) WITH PROPOFOL;  Surgeon: Charlott Rakes, MD;  Location: [facility];  Service: Gastroenterology;  Laterality: N/A;   OPEN REDUCTION INTERNAL FIXATION (ORIF) DISTAL RADIAL FRACTURE Left 11/23/2018   Procedure: OPEN REDUCTION INTERNAL FIXATION (ORIF) LEFT DISTAL RADIAL AND ULNA  FRACTURES;  Surgeon: Betha Loa, MD;  Location: [facility];  Service: Orthopedics;  Laterality: Left;   Social History:  reports that she has never smoked. She has never used smokeless tobacco. She reports that she does not drink alcohol and does not use drugs.  Allergies  Allergen Reactions   Atorvastatin     Knee pain    Family History  Family history unknown: Yes    Prior to Admission medications   Medication Sig Start Date End Date Taking? Authorizing Provider  carbamazepine (CARBATROL) 100 MG 12 hr capsule Take 1 capsule (100 mg total) by mouth 2 (two) times daily as needed (for acute TN flare up). Patient not taking: Reported on 01/22/2023 09/15/20   Hilts, Casimiro Needle, MD  cholecalciferol (CHOLECALCIFEROL) 25 MCG tablet Take 2 tablets (2,000 Units total) by mouth daily. 01/23/23   Glade Lloyd, MD  cyanocobalamin (VITAMIN B12) 1000 MCG tablet Take 1 tablet (1,000 mcg total) by mouth daily. 01/23/23   Glade Lloyd, MD  ezetimibe (ZETIA) 10 MG tablet Take 1 tablet (10 mg total) by mouth daily. Patient not taking: Reported on 01/21/2023 09/26/19   Ihor Austin, NP  gabapentin (NEURONTIN) 300 MG capsule Take 1 capsule (300 mg total) by mouth 3 (three) times daily. Patient taking  differently: Take 300 mg by mouth as needed (Pain / Headaches). 05/25/22   Piontek, Denny Peon, MD  levothyroxine (SYNTHROID) 50 MCG tablet Take 1 tablet (50 mcg total) by mouth daily before breakfast. Patient not taking: Reported on 01/22/2023 03/03/20   Hilts, Casimiro Needle, MD  pantoprazole (PROTONIX) 40 MG tablet Take 1 tablet (40 mg total) by mouth 2 (two) times daily before a meal. GI  recommending Protonix twice a day for 3 months then once a day 01/23/23 04/23/23  Glade Lloyd, MD  rosuvastatin (CRESTOR) 5 MG tablet Take 1 tablet (5 mg total) by mouth daily. Patient not taking: Reported on 01/21/2023 09/26/19   Ihor Austin, NP    Physical Exam: Vitals:   02/16/23 1337 02/16/23 1418 02/16/23 1700 02/16/23 1823  BP: (!) 141/51  (!) 140/53   Pulse: 74  77 72  Resp: (!) 21  19 (!) 25  Temp: 98 F (36.7 C)   (!) 97.5 F (36.4 C)  TempSrc: Oral   Oral  SpO2: 100%  100% 100%  Weight:  54.4 kg    Height:  5\' 2"  (1.575 m)     Constitutional: NAD, calm, comfortable Eyes: PERRL, lids and conjunctivae normal ENMT: Mucous membranes are moist. Posterior pharynx clear of any exudate or lesions.Normal dentition.  Neck: normal, supple, no masses, no thyromegaly Respiratory: clear to auscultation bilaterally, no wheezing, no crackles. Normal respiratory effort. No accessory muscle use.  Cardiovascular: Regular rate and rhythm, no murmurs / rubs / gallops. No extremity edema. 2+ pedal pulses. No carotid bruits.  Abdomen: no tenderness, no masses palpated. No hepatosplenomegaly. Bowel sounds positive.  Musculoskeletal: Good range of motion, no joint swelling or tenderness, Skin: no rashes, lesions, ulcers. No induration Neurologic: CN 2-12 grossly intact. Sensation intact, DTR normal. Strength 5/5 in all 4.  Psychiatric: Normal judgment and insight. Alert and oriented x 3. Normal mood  Data Reviewed:  Blood pressure 140/51, white count 10.7 hemoglobin 7.8 and platelets 319, glucose 115  Assessment and Plan:  #1 symptomatic anemia: Patient has ischemic cardiomyopathy.  He showed me his hemoglobin is somewhere between 8 and 10 g therefore.  At the moment it is 7.8.  She does appear to be having slow GI bleed.  Already being followed by GI.  GI has also been consulted and will follow patient in the hospital and decide on procedure.  Will admit the patient.  Serial H&H.  IV  Protonix.  Will consider blood transfusion at least 1 unit of packed red blood cells to get her hemoglobin above 8 g.  Will place patient on clear liquid diet but n.p.o. after midnight.  #2 recurrent iron deficiency anemia: Again could be due to GI bleed but could be secondary to nutritional iron deficiency.  Will continue as above.  #3 ischemic cardiomyopathy: Patient appears stable.  Will keep her hemoglobin between 8 and 10 g to avoid demand ischemia  #4 non-insulin-dependent diabetes: Will initiate sliding scale insulin.  Blood sugar appears controlled.  #5 hypothyroidism: Check TSH.  Patient has not been taking levothyroxine.  This may be contributing to chronic anemia.  #6 history of CVA: Appears stable.  No significant residual effects.  #7 hyperlipidemia: Continue with home regimen.  #8 adjustment disorder with anxiety: Will confirm and resume home regimen.    Advance Care Planning:   Code Status: Full Code   Consults: GI Dr. Myra Gianotti  Family Communication: No family at bedside  Severity of Illness: The appropriate patient status for this patient is INPATIENT. Inpatient  status is judged to be reasonable and necessary in order to provide the required intensity of service to ensure the patient's safety. The patient's presenting symptoms, physical exam findings, and initial radiographic and laboratory data in the context of their chronic comorbidities is felt to place them at high risk for further clinical deterioration. Furthermore, it is not anticipated that the patient will be medically stable for discharge from the hospital within 2 midnights of admission.   * I certify that at the point of admission it is my clinical judgment that the patient will require inpatient hospital care spanning beyond 2 midnights from the point of admission due to high intensity of service, high risk for further deterioration and high frequency of surveillance required.*  AuthorLonia Blood,  MD 02/16/2023 6:47 PM  For on call review [email or web address].

## 2023-02-16 NOTE — ED Triage Notes (Signed)
Pt arrived via POV. C/o back pain that began yesterday and began shooting down R arm. Pt c/o weakness and SOB beginning today. Pt has hx anemia and requiring blood transfusions.  AOx4

## 2023-02-17 ENCOUNTER — Encounter (HOSPITAL_COMMUNITY): Payer: Self-pay | Admitting: Internal Medicine

## 2023-02-17 DIAGNOSIS — D649 Anemia, unspecified: Secondary | ICD-10-CM | POA: Diagnosis not present

## 2023-02-17 LAB — CBC
HCT: 27 % — ABNORMAL LOW (ref 36.0–46.0)
HCT: 24.5 % — ABNORMAL LOW (ref 36.0–46.0)
HCT: 26.6 % — ABNORMAL LOW (ref 36.0–46.0)
Hemoglobin: 8 g/dL — ABNORMAL LOW (ref 12.0–15.0)
Hemoglobin: 7.3 g/dL — ABNORMAL LOW (ref 12.0–15.0)
Hemoglobin: 7.7 g/dL — ABNORMAL LOW (ref 12.0–15.0)
MCH: 20.7 pg — ABNORMAL LOW (ref 26.0–34.0)
MCH: 21.2 pg — ABNORMAL LOW (ref 26.0–34.0)
MCH: 20.6 pg — ABNORMAL LOW (ref 26.0–34.0)
MCHC: 29.6 g/dL — ABNORMAL LOW (ref 30.0–36.0)
MCHC: 29.8 g/dL — ABNORMAL LOW (ref 30.0–36.0)
MCHC: 28.9 g/dL — ABNORMAL LOW (ref 30.0–36.0)
MCV: 69.9 fL — ABNORMAL LOW (ref 80.0–100.0)
MCV: 71.2 fL — ABNORMAL LOW (ref 80.0–100.0)
MCV: 71.3 fL — ABNORMAL LOW (ref 80.0–100.0)
Platelets: 311 10*3/uL (ref 150–400)
Platelets: 284 10*3/uL (ref 150–400)
Platelets: 305 10*3/uL (ref 150–400)
RBC: 3.86 MIL/uL — ABNORMAL LOW (ref 3.87–5.11)
RBC: 3.44 MIL/uL — ABNORMAL LOW (ref 3.87–5.11)
RBC: 3.73 MIL/uL — ABNORMAL LOW (ref 3.87–5.11)
RDW: 22.5 % — ABNORMAL HIGH (ref 11.5–15.5)
RDW: 22.6 % — ABNORMAL HIGH (ref 11.5–15.5)
RDW: 22.5 % — ABNORMAL HIGH (ref 11.5–15.5)
WBC: 7.7 10*3/uL (ref 4.0–10.5)
WBC: 6.7 10*3/uL (ref 4.0–10.5)
WBC: 7.4 10*3/uL (ref 4.0–10.5)
nRBC: 0 % (ref 0.0–0.2)
nRBC: 0 % (ref 0.0–0.2)
nRBC: 0 % (ref 0.0–0.2)

## 2023-02-17 LAB — COMPREHENSIVE METABOLIC PANEL
ALT: 12 U/L (ref 0–44)
AST: 17 U/L (ref 15–41)
Albumin: 3.2 g/dL — ABNORMAL LOW (ref 3.5–5.0)
Alkaline Phosphatase: 57 U/L (ref 38–126)
Anion gap: 7 (ref 5–15)
BUN: 15 mg/dL (ref 8–23)
CO2: 25 mmol/L (ref 22–32)
Calcium: 9.1 mg/dL (ref 8.9–10.3)
Chloride: 104 mmol/L (ref 98–111)
Creatinine, Ser: 0.39 mg/dL — ABNORMAL LOW (ref 0.44–1.00)
GFR, Estimated: >60 mL/min (ref >60)
Glucose, Bld: 102 mg/dL — ABNORMAL HIGH (ref 70–99)
Potassium: 3.8 mmol/L (ref 3.5–5.1)
Sodium: 136 mmol/L (ref 135–145)
Total Bilirubin: 1.1 mg/dL (ref 0.0–1.2)
Total Protein: 7.3 g/dL (ref 6.5–8.1)

## 2023-02-17 LAB — PREPARE RBC (CROSSMATCH)

## 2023-02-17 MED ORDER — VITAMIN D 25 MCG (1000 UNIT) PO TABS
2000.0000 [IU] | ORAL_TABLET | Freq: Every day | ORAL | Status: DC
  Administered 2023-02-17 – 2023-02-27 (×10): 2000 [IU] via ORAL
  Filled 2023-02-17 (×10): qty 2

## 2023-02-17 MED ORDER — ONDANSETRON HCL 4 MG PO TABS: 4.0000 mg | ORAL_TABLET | Freq: Four times a day (QID) | ORAL | Status: DC | PRN

## 2023-02-17 MED ORDER — SODIUM CHLORIDE 0.9 % IV SOLN: INTRAVENOUS | Status: AC

## 2023-02-17 MED ORDER — SODIUM CHLORIDE 0.9% IV SOLUTION
Freq: Once | INTRAVENOUS | Status: AC
  Administered 2023-02-18: 200 mL via INTRAVENOUS

## 2023-02-17 MED ORDER — MORPHINE SULFATE (PF) 2 MG/ML IV SOLN
2.0000 mg | INTRAVENOUS | Status: DC | PRN
  Administered 2023-02-17 (×2): 2 mg via INTRAVENOUS
  Filled 2023-02-17 (×2): qty 1

## 2023-02-17 MED ORDER — VITAMIN B-12 1000 MCG PO TABS
1000.0000 ug | ORAL_TABLET | Freq: Every day | ORAL | Status: DC
Start: 2023-02-17 — End: 2023-02-27
  Administered 2023-02-17 – 2023-02-27 (×10): 1000 ug via ORAL
  Filled 2023-02-17 (×10): qty 1

## 2023-02-17 MED ORDER — ONDANSETRON HCL 4 MG/2ML IJ SOLN
4.0000 mg | Freq: Four times a day (QID) | INTRAMUSCULAR | Status: DC | PRN
  Administered 2023-02-21 – 2023-02-22 (×3): 4 mg via INTRAVENOUS
  Filled 2023-02-17 (×3): qty 2

## 2023-02-17 MED ORDER — GABAPENTIN 300 MG PO CAPS
300.0000 mg | ORAL_CAPSULE | ORAL | Status: DC | PRN
  Administered 2023-02-23: 300 mg via ORAL
  Filled 2023-02-17: qty 1

## 2023-02-17 MED ORDER — CARBAMAZEPINE ER 100 MG PO TB12: 100.0000 mg | ORAL_TABLET | Freq: Two times a day (BID) | ORAL | Status: DC | PRN

## 2023-02-17 MED ORDER — PEG 3350-KCL-NA BICARB-NACL 420 G PO SOLR
4000.0000 mL | Freq: Once | ORAL | Status: AC
  Administered 2023-02-17: 4000 mL via ORAL
  Filled 2023-02-17: qty 4000

## 2023-02-17 MED ORDER — PANTOPRAZOLE SODIUM 40 MG IV SOLR
40.0000 mg | Freq: Two times a day (BID) | INTRAVENOUS | Status: DC
  Administered 2023-02-17 – 2023-02-23 (×13): 40 mg via INTRAVENOUS
  Filled 2023-02-17 (×13): qty 10

## 2023-02-17 NOTE — H&P (View-Only) (Signed)
Referring Provider: [facility] Primary Care Physician:  Patient, No Pcp Per Primary Gastroenterologist: Unassigned  Reason for Consultation: Symptomatic anemia  HPI: Debra Fry is a 83 y.o. female with past medical history of coronary artery disease, CVA, hypertension who was admitted last month with anemia presented to the hospital with back pain, weakness and shortness of breath.  She was found to have drop in hemoglobin compared to discharge.  GI is consulted for further evaluation.  Patient seen and examined at bedside.  She has been having intermittent dark stool since discharge but denies any black tarry stool or bright blood per rectum.  She denies any abdominal pain, diarrhea or constipation.  EGD on January 23, 2023 showed medium size hiatal hernia, 1 small 3 mm gastric ulcer in the antrum as well as gastritis.  Biopsies were negative for H. pylori.  No previous colonoscopy.   Past Medical History:  Diagnosis Date   Coronary artery disease    GERD (gastroesophageal reflux disease)    Headache    History of hiatal hernia    Hyperlipidemia    Myocardial infarction [facility]) 12/1999   Stroke [facility])     Past Surgical History:  Procedure Laterality Date   BIOPSY  01/23/2023   Procedure: BIOPSY;  Surgeon: Charlott Rakes, MD;  [facility];  Service: Gastroenterology;;   CARDIAC CATHETERIZATION  2001   results in Echart conversion   CORONARY ARTERY BYPASS GRAFT  2001   ENDARTERECTOMY Left 02/13/2018   Procedure: ENDARTERECTOMY CAROTID;  Surgeon: Sherren Kerns, MD;  Location: [facility] OR;  Service: Vascular;  Laterality: Left;   ESOPHAGOGASTRODUODENOSCOPY (EGD) WITH PROPOFOL N/A 01/23/2023   Procedure: ESOPHAGOGASTRODUODENOSCOPY (EGD) WITH PROPOFOL;  Surgeon: Charlott Rakes, MD;  [facility];  Service: Gastroenterology;  Laterality: N/A;   OPEN REDUCTION INTERNAL FIXATION (ORIF) DISTAL RADIAL FRACTURE Left 11/23/2018   Procedure: OPEN REDUCTION INTERNAL  FIXATION (ORIF) LEFT DISTAL RADIAL AND ULNA  FRACTURES;  Surgeon: Betha Loa, MD;  Location: [facility];  Service: Orthopedics;  Laterality: Left;    Prior to Admission medications   Medication Sig Start Date End Date Taking? Authorizing Provider  calcium carbonate (TUMS - DOSED IN MG ELEMENTAL CALCIUM) 500 MG chewable tablet Chew 1 tablet by mouth daily as needed for indigestion or heartburn.   Yes [provider]  carbamazepine (CARBATROL) 100 MG 12 hr capsule Take 100 mg by mouth daily as needed (acute TN flare ups).   Yes [provider]  cholecalciferol (CHOLECALCIFEROL) 25 MCG tablet Take 2 tablets (2,000 Units total) by mouth daily. 01/23/23  Yes Glade Lloyd, MD  cyanocobalamin (VITAMIN B12) 1000 MCG tablet Take 1 tablet (1,000 mcg total) by mouth daily. 01/23/23  Yes Glade Lloyd, MD  gabapentin (NEURONTIN) 300 MG capsule Take 1 capsule (300 mg total) by mouth 3 (three) times daily. Patient taking differently: Take 100 mg by mouth as needed (Pain / Headaches). 05/25/22  Yes Piontek, Denny Peon, MD  pantoprazole (PROTONIX) 40 MG tablet Take 1 tablet (40 mg total) by mouth 2 (two) times daily before a meal. GI recommending Protonix twice a day for 3 months then once a day 01/23/23 04/23/23 Yes Glade Lloyd, MD    Scheduled Meds:  vitamin D3  2,000 Units Oral Daily   cyanocobalamin  1,000 mcg Oral Daily   pantoprazole (PROTONIX) IV  40 mg Intravenous Q12H   Continuous Infusions:  sodium chloride 40 mL/hr at 02/17/23 0335   PRN Meds:.carbamazepine, gabapentin, morphine injection, ondansetron **OR** ondansetron (ZOFRAN) IV  Allergies as of 02/16/2023 - Review Complete 02/16/2023  Allergen Reaction Noted   Atorvastatin  03/28/2019    Family History  Family history unknown: Yes    Social History   Socioeconomic History   Marital status: Married    Spouse name: Not on file   Number of children: Not on file   Years of education: Not on file    Highest education level: Not on file  Occupational History   Not on file  Tobacco Use   Smoking status: Never   Smokeless tobacco: Never  Vaping Use   Vaping status: Never Used  Substance and Sexual Activity   Alcohol use: No   Drug use: No   Sexual activity: Not on file  Other Topics Concern   Not on file  Social History Narrative   Not on file   Social Drivers of Health   Financial Resource Strain: Not on file  Food Insecurity: No Food Insecurity (01/22/2023)   Hunger Vital Sign    Worried About Running Out of Food in the Last Year: Never [place]    Ran Out of Food in the Last Year: Never [place]  Transportation Needs: No Transportation Needs (01/22/2023)   PRAPARE - Administrator, Civil Service (Medical): No    Lack of Transportation (Non-Medical): No  Physical Activity: Not on file  Stress: Not on file  Social Connections: Not on file  Intimate Partner Violence: Not At Risk (01/22/2023)   Humiliation, Afraid, Rape, and Kick questionnaire    Fear of Current or Ex-Partner: No    Emotionally Abused: No    Physically Abused: No    Sexually Abused: No    Review of Systems: All negative except as stated above in HPI.  Physical Exam: Vital signs: Vitals:   02/17/23 0642 02/17/23 0700  BP:  134/60  Pulse:  66  Resp:  16  Temp: 98.6 F (37 C)   SpO2:  97%     General:   Alert,  Well-developed, well-nourished, pleasant and cooperative in NAD Lungs: No visible respiratory distress Heart:  Regular rate and rhythm; no murmurs, clicks, rubs,  or gallops. Abdomen: Soft, nontender, nondistended, bowel sound present, no peritoneal signs Rectal:  Deferred  GI:  Lab Results: Recent Labs    02/16/23 1537 02/17/23 0329 02/17/23 0907  WBC 10.7* 7.7 6.7  HGB 7.8* 8.0* 7.3*  HCT 26.7* 27.0* 24.5*  PLT 319 311 284   BMET Recent Labs    02/16/23 1537 02/17/23 0525  NA 135 136  K 4.0 3.8  CL 102 104  CO2 24 25  GLUCOSE 115* 102*  BUN 17 15  CREATININE  0.51 0.39*  CALCIUM 9.3 9.1   LFT Recent Labs    02/17/23 0525  PROT 7.3  ALBUMIN 3.2*  AST 17  ALT 12  ALKPHOS 57  BILITOT 1.1   PT/INR No results for input(s): "LABPROT", "INR" in the last 72 hours.   Studies/Results: DG Chest 2 View Result Date: 02/16/2023 CLINICAL DATA:  SOB EXAM: CHEST - 2 VIEW COMPARISON:  01/21/23. FINDINGS: Sternal wires. Post op changes. No consolidation, pneumothorax, effusion or edema. Calcified aorta. Bilateral apical pleural thickening. Degenerative changes along the spine. IMPRESSION: Post op chest.  Chronic changes. Electronically Signed   By: Karen Kays M.D.   On: 02/16/2023 15:04    Impression/Plan: -Iron deficiency anemia with dark stool.  Hemoglobin down to 7.3.  EGD January 23, 2023 showed small 3 mm gastric ulcer and gastritis.  Biopsies were negative for H. pylori.  Recommendations ------------------------- -Recommend EGD tomorrow for follow-up on gastric ulcer for persistent anemia.  Also recommend colonoscopy for ongoing anemia. -Sinew other supportive care.  Okay to have clear liquids diet today.  Keep n.p.o. past midnight.  Risks (bleeding, infection, bowel perforation that could require surgery, sedation-related changes in cardiopulmonary systems), benefits (identification and possible treatment of source of symptoms, exclusion of certain causes of symptoms), and alternatives (watchful waiting, radiographic imaging studies, empiric medical treatment)  were explained to patient/family in detail and patient wishes to proceed.     LOS: 1 day   Kathi Der  MD, FACP 02/17/2023, 9:53 AM  Contact #  [phone]

## 2023-02-17 NOTE — ED Notes (Signed)
Pt does not wish to start NuLYTELY prep in the ED as she has no private restroom and does not like the idea of a bedside commode.  Elects to wait and see if she is placed in an inpatient bed first

## 2023-02-17 NOTE — Progress Notes (Signed)
PROGRESS NOTE    Debra Fry  NWG:[id number] DOB: 1941/01/15 DOA: 02/16/2023 PCP: Patient, No Pcp Per   Brief Narrative: 83 year old female with history of coronary artery disease, MI, GERD hiatal hernia stroke, hyperlipidemia, peptic ulcer disease, admitted with generalized weakness back pain and right upper extremity pain.  Patient was admitted recently to the hospital with similar complaints and was found to have a hemoglobin of 5.4 she was transfused at that time with 2 units of packed RBCs started on Protonix twice a day, EGD at that time showed small clean-based gastric ulcer with no evidence of active bleeding biopsies were negative for H. pylori.  GI followed the patient at that time.  FOBT is positive this admission.  GI was consulted. Patient reported having dark stools at home after discharge from the hospital.. Assessment & Plan:   Principal Problem:   Symptomatic anemia Active Problems:   Familial hyperlipidemia   Diabetes (HCC)   Hypothyroidism   Adjustment disorder with anxiety   Gout   Leucocytosis   #1 IDA anemia panel from December 2024 with iron of 18 and TIBC elevated with low ferritin at 3 and saturation at 3. Last EGD in December 2024 showed 3 mm gastric ulcer and gastritis with negative biopsy for H. pylori.  Patient continues with anemia of unclear etiology.  Patient scheduled for repeat EGD and colonoscopy tomorrow.  N.p.o. after midnight. Hemoglobin 7.3 today from 8.0 We will transfuse 1 unit of packed RBC today.   #2 type 2 diabetes she does not take anything at home we will check an A1c CBG (last 3)  No results for input(s): "GLUCAP" in the last 72 hours.   #3 hypothyroidism her last TSH end of next December 2024 was within normal limits apparently she has stopped taking Synthroid  #4 history of ischemic cardiomyopathy not on diuretics prior to admission Last echo January 2020 EF 55 to 60%  #5 history of stroke without any residual effects  #6  hyperlipidemia not on statins  #7 B12 deficiency B12 level was 155 in December 2024 on B12 supplements  #8 anxiety on Neurontin carbamazepine   Estimated body mass index is 21.95 kg/m as calculated from the following:   Height as of this encounter: 5\' 2"  (1.575 m).   Weight as of this encounter: 54.4 kg.  DVT prophylaxis: None Code Status: Full Family Communication: None at bedside  disposition Plan:  Status is: Inpatient Remains inpatient appropriate because: Acute illness GI bleed   Consultants:  gi  Procedures: none  Antimicrobials:  Subjective:  Admitted with shortness of breath right upper extremity pain which has resolved this is been going on for few days prior to admission Objective: Vitals:   02/17/23 0700 02/17/23 0930 02/17/23 1030 02/17/23 1048  BP: 134/60 (!) 161/63 (!) 160/58   Pulse: 66 67 68   Resp: 16 19 (!) 23   Temp:    98.7 F (37.1 C)  TempSrc:    Oral  SpO2: 97% 98% 99%   Weight:      Height:       No intake or output data in the 24 hours ending 02/17/23 1300 Filed Weights   02/16/23 1418  Weight: 54.4 kg    Examination:  General exam: Appears in no acute distress  respiratory system: Clear to auscultation. Respiratory effort normal. Cardiovascular system: S1 & S2 heard, RRR. No JVD, murmurs, rubs, gallops or clicks. No pedal edema. Gastrointestinal system: Abdomen is nondistended, soft and nontender. No organomegaly or  masses felt. Normal bowel sounds heard. Central nervous system: Alert and oriented. No focal neurological deficits. Extremities:no edema  Data Reviewed: I have personally reviewed following labs and imaging studies  CBC: Recent Labs  Lab 02/16/23 1537 02/17/23 0329 02/17/23 0907  WBC 10.7* 7.7 6.7  HGB 7.8* 8.0* 7.3*  HCT 26.7* 27.0* 24.5*  MCV 71.2* 69.9* 71.2*  PLT 319 311 284   Basic Metabolic Panel: Recent Labs  Lab 02/16/23 1537 02/17/23 0525  NA 135 136  K 4.0 3.8  CL 102 104  CO2 24 25  GLUCOSE  115* 102*  BUN 17 15  CREATININE 0.51 0.39*  CALCIUM 9.3 9.1   GFR: Estimated Creatinine Clearance: 44.4 mL/min (A) (by C-G formula based on SCr of 0.39 mg/dL (L)). Liver Function Tests: Recent Labs  Lab 02/16/23 1537 02/17/23 0525  AST 18 17  ALT 10 12  ALKPHOS 67 57  BILITOT 0.8 1.1  PROT 7.9 7.3  ALBUMIN 3.6 3.2*   No results for input(s): "LIPASE", "AMYLASE" in the last 168 hours. No results for input(s): "AMMONIA" in the last 168 hours. Coagulation Profile: No results for input(s): "INR", "PROTIME" in the last 168 hours. Cardiac Enzymes: No results for input(s): "CKTOTAL", "CKMB", "CKMBINDEX", "TROPONINI" in the last 168 hours. BNP (last 3 results) No results for input(s): "PROBNP" in the last 8760 hours. HbA1C: No results for input(s): "HGBA1C" in the last 72 hours. CBG: No results for input(s): "GLUCAP" in the last 168 hours. Lipid Profile: No results for input(s): "CHOL", "HDL", "LDLCALC", "TRIG", "CHOLHDL", "LDLDIRECT" in the last 72 hours. Thyroid Function Tests: No results for input(s): "TSH", "T4TOTAL", "FREET4", "T3FREE", "THYROIDAB" in the last 72 hours. Anemia Panel: No results for input(s): "VITAMINB12", "FOLATE", "FERRITIN", "TIBC", "IRON", "RETICCTPCT" in the last 72 hours. Sepsis Labs: No results for input(s): "PROCALCITON", "LATICACIDVEN" in the last 168 hours.  No results found for this or any previous visit (from the past 240 hours).   Radiology Studies: DG Chest 2 View Result Date: 02/16/2023 CLINICAL DATA:  SOB EXAM: CHEST - 2 VIEW COMPARISON:  01/21/23. FINDINGS: Sternal wires. Post op changes. No consolidation, pneumothorax, effusion or edema. Calcified aorta. Bilateral apical pleural thickening. Degenerative changes along the spine. IMPRESSION: Post op chest.  Chronic changes. Electronically Signed   By: Karen Kays M.D.   On: 02/16/2023 15:04    Scheduled Meds:  vitamin D3  2,000 Units Oral Daily   cyanocobalamin  1,000 mcg Oral Daily    pantoprazole (PROTONIX) IV  40 mg Intravenous Q12H   polyethylene glycol-electrolytes  4,000 mL Oral Once   Continuous Infusions:  sodium chloride 40 mL/hr at 02/17/23 0335     LOS: 1 day    Time spent: 38 min Alwyn Ren, MD  02/17/2023, 1:00 PM

## 2023-02-17 NOTE — ED Notes (Signed)
Called blood bank to inquire about blood

## 2023-02-17 NOTE — Consult Note (Signed)
Referring Provider: [facility] Primary Care Physician:  Patient, No Pcp Per Primary Gastroenterologist: Unassigned  Reason for Consultation: Symptomatic anemia  HPI: Debra Fry is a 83 y.o. female with past medical history of coronary artery disease, CVA, hypertension who was admitted last month with anemia presented to the hospital with back pain, weakness and shortness of breath.  She was found to have drop in hemoglobin compared to discharge.  GI is consulted for further evaluation.  Patient seen and examined at bedside.  She has been having intermittent dark stool since discharge but denies any black tarry stool or bright blood per rectum.  She denies any abdominal pain, diarrhea or constipation.  EGD on January 23, 2023 showed medium size hiatal hernia, 1 small 3 mm gastric ulcer in the antrum as well as gastritis.  Biopsies were negative for H. pylori.  No previous colonoscopy.   Past Medical History:  Diagnosis Date   Coronary artery disease    GERD (gastroesophageal reflux disease)    Headache    History of hiatal hernia    Hyperlipidemia    Myocardial infarction [facility]) 12/1999   Stroke [facility])     Past Surgical History:  Procedure Laterality Date   BIOPSY  01/23/2023   Procedure: BIOPSY;  Surgeon: Charlott Rakes, MD;  Location: [facility];  Service: Gastroenterology;;   CARDIAC CATHETERIZATION  2001   results in Echart conversion   CORONARY ARTERY BYPASS GRAFT  2001   ENDARTERECTOMY Left 02/13/2018   Procedure: ENDARTERECTOMY CAROTID;  Surgeon: Sherren Kerns, MD;  Location: [facility] OR;  Service: Vascular;  Laterality: Left;   ESOPHAGOGASTRODUODENOSCOPY (EGD) WITH PROPOFOL N/A 01/23/2023   Procedure: ESOPHAGOGASTRODUODENOSCOPY (EGD) WITH PROPOFOL;  Surgeon: Charlott Rakes, MD;  Location: [facility];  Service: Gastroenterology;  Laterality: N/A;   OPEN REDUCTION INTERNAL FIXATION (ORIF) DISTAL RADIAL FRACTURE Left 11/23/2018   Procedure: OPEN REDUCTION INTERNAL  FIXATION (ORIF) LEFT DISTAL RADIAL AND ULNA  FRACTURES;  Surgeon: Betha Loa, MD;  Location: [facility];  Service: Orthopedics;  Laterality: Left;    Prior to Admission medications   Medication Sig Start Date End Date Taking? Authorizing Provider  calcium carbonate (TUMS - DOSED IN MG ELEMENTAL CALCIUM) 500 MG chewable tablet Chew 1 tablet by mouth daily as needed for indigestion or heartburn.   Yes [provider]  carbamazepine (CARBATROL) 100 MG 12 hr capsule Take 100 mg by mouth daily as needed (acute [place] flare ups).   Yes [provider]  cholecalciferol (CHOLECALCIFEROL) 25 MCG tablet Take 2 tablets (2,000 Units total) by mouth daily. 01/23/23  Yes Glade Lloyd, MD  cyanocobalamin (VITAMIN B12) 1000 MCG tablet Take 1 tablet (1,000 mcg total) by mouth daily. 01/23/23  Yes Glade Lloyd, MD  gabapentin (NEURONTIN) 300 MG capsule Take 1 capsule (300 mg total) by mouth 3 (three) times daily. Patient taking differently: Take 100 mg by mouth as needed (Pain / Headaches). 05/25/22  Yes Piontek, Denny Peon, MD  pantoprazole (PROTONIX) 40 MG tablet Take 1 tablet (40 mg total) by mouth 2 (two) times daily before a meal. GI recommending Protonix twice a day for 3 months then once a day 01/23/23 04/23/23 Yes Glade Lloyd, MD    Scheduled Meds:  vitamin D3  2,000 Units Oral Daily   cyanocobalamin  1,000 mcg Oral Daily   pantoprazole (PROTONIX) IV  40 mg Intravenous Q12H   Continuous Infusions:  sodium chloride 40 mL/hr at 02/17/23 0335   PRN Meds:.carbamazepine, gabapentin, morphine injection, ondansetron **OR** ondansetron (ZOFRAN) IV  Allergies as of 02/16/2023 - Review Complete 02/16/2023  Allergen Reaction Noted   Atorvastatin  03/28/2019    Family History  Family history unknown: Yes    Social History   Socioeconomic History   Marital status: Married    Spouse name: Not on file   Number of children: Not on file   Years of education: Not on file    Highest education level: Not on file  Occupational History   Not on file  Tobacco Use   Smoking status: Never   Smokeless tobacco: Never  Vaping Use   Vaping status: Never Used  Substance and Sexual Activity   Alcohol use: No   Drug use: No   Sexual activity: Not on file  Other Topics Concern   Not on file  Social History Narrative   Not on file   Social Drivers of Health   Financial Resource Strain: Not on file  Food Insecurity: No Food Insecurity (01/22/2023)   Hunger Vital Sign    Worried About Running Out of Food in the Last Year: Never [place]    Ran Out of Food in the Last Year: Never [place]  Transportation Needs: No Transportation Needs (01/22/2023)   PRAPARE - Administrator, Civil Service (Medical): No    Lack of Transportation (Non-Medical): No  Physical Activity: Not on file  Stress: Not on file  Social Connections: Not on file  Intimate Partner Violence: Not At Risk (01/22/2023)   Humiliation, Afraid, Rape, and Kick questionnaire    Fear of Current or Ex-Partner: No    Emotionally Abused: No    Physically Abused: No    Sexually Abused: No    Review of Systems: All negative except as stated above in HPI.  Physical Exam: Vital signs: Vitals:   02/17/23 0642 02/17/23 0700  BP:  134/60  Pulse:  66  Resp:  16  Temp: 98.6 F (37 C)   SpO2:  97%     General:   Alert,  Well-developed, well-nourished, pleasant and cooperative in NAD Lungs: No visible respiratory distress Heart:  Regular rate and rhythm; no murmurs, clicks, rubs,  or gallops. Abdomen: Soft, nontender, nondistended, bowel sound present, no peritoneal signs Rectal:  Deferred  GI:  Lab Results: Recent Labs    02/16/23 1537 02/17/23 0329 02/17/23 0907  WBC 10.7* 7.7 6.7  HGB 7.8* 8.0* 7.3*  HCT 26.7* 27.0* 24.5*  PLT 319 311 284   BMET Recent Labs    02/16/23 1537 02/17/23 0525  NA 135 136  K 4.0 3.8  CL 102 104  CO2 24 25  GLUCOSE 115* 102*  BUN 17 15  CREATININE  0.51 0.39*  CALCIUM 9.3 9.1   LFT Recent Labs    02/17/23 0525  PROT 7.3  ALBUMIN 3.2*  AST 17  ALT 12  ALKPHOS 57  BILITOT 1.1   PT/INR No results for input(s): "LABPROT", "INR" in the last 72 hours.   Studies/Results: DG Chest 2 View Result Date: 02/16/2023 CLINICAL DATA:  SOB EXAM: CHEST - 2 VIEW COMPARISON:  01/21/23. FINDINGS: Sternal wires. Post op changes. No consolidation, pneumothorax, effusion or edema. Calcified aorta. Bilateral apical pleural thickening. Degenerative changes along the spine. IMPRESSION: Post op chest.  Chronic changes. Electronically Signed   By: Karen Kays M.D.   On: 02/16/2023 15:04    Impression/Plan: -Iron deficiency anemia with dark stool.  Hemoglobin down to 7.3.  EGD January 23, 2023 showed small 3 mm gastric ulcer and gastritis.  Biopsies were negative for H. pylori.  Recommendations ------------------------- -Recommend EGD tomorrow for follow-up on gastric ulcer for persistent anemia.  Also recommend colonoscopy for ongoing anemia. -Sinew other supportive care.  Okay to have clear liquids diet today.  Keep n.p.o. past midnight.  Risks (bleeding, infection, bowel perforation that could require surgery, sedation-related changes in cardiopulmonary systems), benefits (identification and possible treatment of source of symptoms, exclusion of certain causes of symptoms), and alternatives (watchful waiting, radiographic imaging studies, empiric medical treatment)  were explained to patient/family in detail and patient wishes to proceed.     LOS: 1 day   Kathi Der  MD, FACP 02/17/2023, 9:53 AM  Contact #  [phone]

## 2023-02-17 NOTE — Progress Notes (Signed)
1 PRBC completed.  Bowel prep completed as well with very small amount left.

## 2023-02-18 ENCOUNTER — Encounter (HOSPITAL_COMMUNITY): Payer: Self-pay | Admitting: Internal Medicine

## 2023-02-18 ENCOUNTER — Inpatient Hospital Stay (HOSPITAL_COMMUNITY): Payer: Medicare HMO

## 2023-02-18 ENCOUNTER — Encounter (HOSPITAL_COMMUNITY)
Admission: EM | Disposition: A | Discharge status: Home / Self Care | Payer: Self-pay | Source: Home / Self Care | Attending: Internal Medicine

## 2023-02-18 ENCOUNTER — Inpatient Hospital Stay (HOSPITAL_COMMUNITY): Payer: Medicare HMO | Admitting: Anesthesiology

## 2023-02-18 DIAGNOSIS — C182 Malignant neoplasm of ascending colon: Secondary | ICD-10-CM

## 2023-02-18 DIAGNOSIS — I2510 Atherosclerotic heart disease of native coronary artery without angina pectoris: Secondary | ICD-10-CM

## 2023-02-18 DIAGNOSIS — K2970 Gastritis, unspecified, without bleeding: Secondary | ICD-10-CM

## 2023-02-18 DIAGNOSIS — K449 Diaphragmatic hernia without obstruction or gangrene: Secondary | ICD-10-CM

## 2023-02-18 DIAGNOSIS — D649 Anemia, unspecified: Secondary | ICD-10-CM | POA: Diagnosis not present

## 2023-02-18 HISTORY — PX: BIOPSY: SHX5522

## 2023-02-18 HISTORY — PX: COLONOSCOPY WITH PROPOFOL: SHX5780

## 2023-02-18 HISTORY — PX: POLYPECTOMY: SHX5525

## 2023-02-18 HISTORY — PX: ESOPHAGOGASTRODUODENOSCOPY (EGD) WITH PROPOFOL: SHX5813

## 2023-02-18 HISTORY — PX: SUBMUCOSAL TATTOO INJECTION: SHX6856

## 2023-02-18 LAB — TYPE AND SCREEN
ABO/RH(D): A POS
Antibody Screen: NEGATIVE
Unit division: 0

## 2023-02-18 LAB — LIPID PANEL
Cholesterol: 156 mg/dL (ref 0–200)
HDL: 41 mg/dL (ref >40)
LDL Cholesterol: 102 mg/dL — ABNORMAL HIGH (ref 0–99)
Total CHOL/HDL Ratio: 3.8 {ratio}
Triglycerides: 64 mg/dL (ref <150)
VLDL: 13 mg/dL (ref 0–40)

## 2023-02-18 LAB — CBC
HCT: 27.9 % — ABNORMAL LOW (ref 36.0–46.0)
HCT: 26.9 % — ABNORMAL LOW (ref 36.0–46.0)
Hemoglobin: 8.5 g/dL — ABNORMAL LOW (ref 12.0–15.0)
Hemoglobin: 8.3 g/dL — ABNORMAL LOW (ref 12.0–15.0)
MCH: 22.8 pg — ABNORMAL LOW (ref 26.0–34.0)
MCH: 22.6 pg — ABNORMAL LOW (ref 26.0–34.0)
MCHC: 30.5 g/dL (ref 30.0–36.0)
MCHC: 30.9 g/dL (ref 30.0–36.0)
MCV: 75 fL — ABNORMAL LOW (ref 80.0–100.0)
MCV: 73.3 fL — ABNORMAL LOW (ref 80.0–100.0)
Platelets: 272 10*3/uL (ref 150–400)
Platelets: 265 10*3/uL (ref 150–400)
RBC: 3.72 MIL/uL — ABNORMAL LOW (ref 3.87–5.11)
RBC: 3.67 MIL/uL — ABNORMAL LOW (ref 3.87–5.11)
RDW: 23.7 % — ABNORMAL HIGH (ref 11.5–15.5)
RDW: 23.8 % — ABNORMAL HIGH (ref 11.5–15.5)
WBC: 9.2 10*3/uL (ref 4.0–10.5)
WBC: 8.7 10*3/uL (ref 4.0–10.5)
nRBC: 0 % (ref 0.0–0.2)
nRBC: 0 % (ref 0.0–0.2)

## 2023-02-18 LAB — COMPREHENSIVE METABOLIC PANEL
ALT: 11 U/L (ref 0–44)
AST: 17 U/L (ref 15–41)
Albumin: 3 g/dL — ABNORMAL LOW (ref 3.5–5.0)
Alkaline Phosphatase: 57 U/L (ref 38–126)
Anion gap: 11 (ref 5–15)
BUN: 16 mg/dL (ref 8–23)
CO2: 21 mmol/L — ABNORMAL LOW (ref 22–32)
Calcium: 8.8 mg/dL — ABNORMAL LOW (ref 8.9–10.3)
Chloride: 101 mmol/L (ref 98–111)
Creatinine, Ser: 0.64 mg/dL (ref 0.44–1.00)
GFR, Estimated: >60 mL/min (ref >60)
Glucose, Bld: 87 mg/dL (ref 70–99)
Potassium: 3.7 mmol/L (ref 3.5–5.1)
Sodium: 133 mmol/L — ABNORMAL LOW (ref 135–145)
Total Bilirubin: 1.5 mg/dL — ABNORMAL HIGH (ref 0.0–1.2)
Total Protein: 6.9 g/dL (ref 6.5–8.1)

## 2023-02-18 LAB — BPAM RBC
Blood Product Expiration Date: 202502152359
ISSUE DATE / TIME: 202501231950
Unit Type and Rh: 6200

## 2023-02-18 LAB — HEMOGLOBIN A1C
Hgb A1c MFr Bld: 5.8 % — ABNORMAL HIGH (ref 4.8–5.6)
Mean Plasma Glucose: 119.76 mg/dL

## 2023-02-18 LAB — VITAMIN D 25 HYDROXY (VIT D DEFICIENCY, FRACTURES): Vit D, 25-Hydroxy: 25.46 ng/mL — ABNORMAL LOW (ref 30–100)

## 2023-02-18 SURGERY — ESOPHAGOGASTRODUODENOSCOPY (EGD) WITH PROPOFOL
Anesthesia: Monitor Anesthesia Care

## 2023-02-18 MED ORDER — PROPOFOL 500 MG/50ML IV EMUL
INTRAVENOUS | Status: DC | PRN
  Administered 2023-02-18: 125 ug/kg/min via INTRAVENOUS

## 2023-02-18 MED ORDER — SPOT INK MARKER SYRINGE KIT
PACK | SUBMUCOSAL | Status: AC
Start: 2023-02-18 — End: ?
  Filled 2023-02-18: qty 5

## 2023-02-18 MED ORDER — IOHEXOL 300 MG/ML  SOLN
15.0000 mL | INTRAMUSCULAR | Status: AC
  Administered 2023-02-18 (×2): 15 mL via ORAL

## 2023-02-18 MED ORDER — SPOT INK MARKER SYRINGE KIT
PACK | SUBMUCOSAL | Status: DC | PRN
  Administered 2023-02-18: 1.5 mL via SUBMUCOSAL

## 2023-02-18 MED ORDER — PROPOFOL 1000 MG/100ML IV EMUL
INTRAVENOUS | Status: AC
  Filled 2023-02-18: qty 100

## 2023-02-18 MED ORDER — IOHEXOL 300 MG/ML  SOLN
100.0000 mL | Freq: Once | INTRAMUSCULAR | Status: AC | PRN
  Administered 2023-02-18: 100 mL via INTRAVENOUS

## 2023-02-18 MED ORDER — PHENYLEPHRINE 80 MCG/ML (10ML) SYRINGE FOR IV PUSH (FOR BLOOD PRESSURE SUPPORT)
PREFILLED_SYRINGE | INTRAVENOUS | Status: DC | PRN
  Administered 2023-02-18 (×2): 80 ug via INTRAVENOUS
  Administered 2023-02-18: 120 ug via INTRAVENOUS

## 2023-02-18 MED ORDER — PROPOFOL 10 MG/ML IV BOLUS
INTRAVENOUS | Status: DC | PRN
  Administered 2023-02-18: 30 mg via INTRAVENOUS

## 2023-02-18 SURGICAL SUPPLY — 24 items
BLOCK BITE 60FR ADLT L/F BLUE (MISCELLANEOUS) ×2 IMPLANT
ELECT REM PT RETURN 9FT ADLT (ELECTROSURGICAL)
ELECTRODE REM PT RTRN 9FT ADLT (ELECTROSURGICAL) IMPLANT
FCP BXJMBJMB 240X2.8X (CUTTING FORCEPS)
FLOOR PAD 36X40 (MISCELLANEOUS) ×2
FORCEP RJ3 GP 1.8X160 W-NEEDLE (CUTTING FORCEPS) IMPLANT
FORCEPS BIOP RAD 4 LRG CAP 4 (CUTTING FORCEPS) IMPLANT
FORCEPS BIOP RJ4 240 W/NDL (CUTTING FORCEPS)
FORCEPS BXJMBJMB 240X2.8X (CUTTING FORCEPS) IMPLANT
INJECTOR/SNARE I SNARE (MISCELLANEOUS) IMPLANT
LUBRICANT JELLY 4.5OZ STERILE (MISCELLANEOUS) IMPLANT
MANIFOLD NEPTUNE II (INSTRUMENTS) IMPLANT
NDL SCLEROTHERAPY 25GX240 (NEEDLE) IMPLANT
NEEDLE SCLEROTHERAPY 25GX240 (NEEDLE) IMPLANT
PAD FLOOR 36X40 (MISCELLANEOUS) ×2 IMPLANT
PROBE APC STR FIRE (PROBE) IMPLANT
PROBE INJECTION GOLD 7FR (MISCELLANEOUS) IMPLANT
SNARE ROTATE MED OVAL 20MM (MISCELLANEOUS) IMPLANT
SNARE SHORT THROW 13M SML OVAL (MISCELLANEOUS) IMPLANT
SYR 50ML LL SCALE MARK (SYRINGE) IMPLANT
TRAP SPECIMEN MUCOUS 40CC (MISCELLANEOUS) IMPLANT
TUBING ENDO SMARTCAP PENTAX (MISCELLANEOUS) ×4 IMPLANT
TUBING IRRIGATION ENDOGATOR (MISCELLANEOUS) ×2 IMPLANT
WATER STERILE IRR 1000ML POUR (IV SOLUTION) IMPLANT

## 2023-02-18 NOTE — Consult Note (Signed)
Debra Fry 1940-10-05  [id number].    Requesting MD: Dr. Jerolyn Center Chief Complaint/Reason for Consult: colon mass  HPI: Debra Fry is a 83 y.o. with who presented to the ED on 1/22 with weakness, SHOB, and right shoulder pain and was admitted to the hospitalist service after work up significant for recurrent symptomatic anemia. She had had prior GI bleeds for which she had planned further outpatient work up by GI with EGD and colonoscopy.   EGD and colonoscopy completed today by Dr. 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 Area was tattooed.   Patient reports that she was tolerating a diet at home with normal appetite and no nausea and vomiting prior to presentation.  She did have 2 days of abdominal cramping and diarrhea with melena like stool earlier this week.  She reports no current abdominal pain.She was able to complete prep for colonoscopy. Her last BM was last night and reports brown stool without obvious blood or melena. No reported unexpected weight loss.   Past Medical History: MI/CAD s/p CABG, CVA, prior carotid endarterectomy, GERD, hiatal hernia, peptic ulcer disease, HLD Prior Abdominal Surgeries: None Blood Thinners: None Tobacco Use: None but reported exposure to 2nd hand smoke Alcohol Use: None Substance use: None  Employment: She works as a Engineer, water. Prior to developing anemia she was able to climb 2 flights of stairs without sob. However over the last 1+month she has become fatigue more easily with activity.   ROS: ROS As above, see hpi  Family History  Family history unknown: Yes    Past Medical History:  Diagnosis Date   Coronary artery disease    GERD (gastroesophageal reflux disease)    Headache    History of hiatal  hernia    Hyperlipidemia    Myocardial infarction (HCC) 12/1999   Stroke [facility])     Past Surgical History:  Procedure Laterality Date   BIOPSY  01/23/2023   Procedure: BIOPSY;  Surgeon: Charlott Rakes, MD;  Location: [facility];  Service: Gastroenterology;;   CARDIAC CATHETERIZATION  2001   results in Echart conversion   CORONARY ARTERY BYPASS GRAFT  2001   ENDARTERECTOMY Left 02/13/2018   Procedure: ENDARTERECTOMY CAROTID;  Surgeon: Sherren Kerns, MD;  Location: [facility] OR;  Service: Vascular;  Laterality: Left;   ESOPHAGOGASTRODUODENOSCOPY (EGD) WITH PROPOFOL N/A 01/23/2023   Procedure: ESOPHAGOGASTRODUODENOSCOPY (EGD) WITH PROPOFOL;  Surgeon: Charlott Rakes, MD;  Location: [facility];  Service: Gastroenterology;  Laterality: N/A;   OPEN REDUCTION INTERNAL FIXATION (ORIF) DISTAL RADIAL FRACTURE Left 11/23/2018   Procedure: OPEN REDUCTION INTERNAL FIXATION (ORIF) LEFT DISTAL RADIAL AND ULNA  FRACTURES;  Surgeon: Betha Loa, MD;  Location: [facility];  Service: Orthopedics;  Laterality: Left;    Social History:  reports that she has never smoked. She has never used smokeless tobacco. She reports that she does not drink alcohol and does not use drugs.  Allergies:  Allergies  Allergen Reactions   Atorvastatin     Knee pain    Medications Prior to Admission  Medication Sig Dispense Refill   calcium carbonate (TUMS - DOSED IN MG ELEMENTAL CALCIUM) 500 MG chewable tablet Chew 1 tablet by mouth daily as needed for indigestion or heartburn.     carbamazepine (CARBATROL) 100 MG 12 hr capsule Take 100 mg by mouth  daily as needed (acute TN flare ups).     cholecalciferol (CHOLECALCIFEROL) 25 MCG tablet Take 2 tablets (2,000 Units total) by mouth daily. 60 tablet 0   cyanocobalamin (VITAMIN B12) 1000 MCG tablet Take 1 tablet (1,000 mcg total) by mouth daily. 30 tablet 2   gabapentin (NEURONTIN) 300 MG capsule Take 1 capsule (300 mg total) by mouth 3 (three) times  daily. (Patient taking differently: Take 100 mg by mouth as needed (Pain / Headaches).) 90 capsule 0   pantoprazole (PROTONIX) 40 MG tablet Take 1 tablet (40 mg total) by mouth 2 (two) times daily before a meal. GI recommending Protonix twice a day for 3 months then once a day 60 tablet 2     Physical Exam: Blood pressure (!) 144/36, pulse (!) 58, temperature 98 F (36.7 C), temperature source Temporal, resp. rate (!) 24, height 5\' 2"  (1.575 m), weight 54.4 kg, SpO2 98%. General: pleasant, elderly female who is laying in bed in NAD HEENT: head is normocephalic, atraumatic.  Heart: regular, rate, and rhythm.   Lungs: CTAB, no wheezes, rhonchi, or rales noted.  Respiratory effort nonlabored Abd:  Soft, ND, NT, +BS. No palpable masses, hernias, or organomegaly MS: no BUE or BLE edema Skin: warm and dry  Psych: A&Ox4 with an appropriate affect Neuro: normal speech, thought process intact, moves all extremities, gait not assessed  Results for orders placed or performed during the hospital encounter of 02/16/23 (from the past 48 hours)  Type and screen [facility]     Status: None   Collection Time: 02/16/23  3:37 PM  Result Value Ref Range   ABO/RH(D) A POS    Antibody Screen NEG    Sample Expiration 02/19/2023,2359    Unit Number [id number]    Blood Component Type RED CELLS,LR    Unit division 00    Status of Unit ISSUED,FINAL    Transfusion Status OK TO TRANSFUSE    Crossmatch Result      Compatible Performed at [facility], [address] [place], [place] [address]   CBC     Status: Abnormal   Collection Time: 02/16/23  3:37 PM  Result Value Ref Range   WBC 10.7 (H) 4.0 - 10.5 K/uL   RBC 3.75 (L) 3.87 - 5.11 MIL/uL   Hemoglobin 7.8 (L) 12.0 - 15.0 g/dL    Comment: Reticulocyte Hemoglobin testing may be clinically indicated, consider ordering this additional test [id number]    HCT 26.7 (L) 36.0 - 46.0 %   MCV 71.2 (L) 80.0 - 100.0 fL    MCH 20.8 (L) 26.0 - 34.0 pg   MCHC 29.2 (L) 30.0 - 36.0 g/dL   RDW 96.2 (H) 95.2 - 84.1 %   Platelets 319 150 - 400 K/uL   nRBC 0.0 0.0 - 0.2 %    Comment: Performed at [facility], [address] [place], [place] [address]  Comprehensive metabolic panel     Status: Abnormal   Collection Time: 02/16/23  3:37 PM  Result Value Ref Range   Sodium 135 135 - 145 mmol/L   Potassium 4.0 3.5 - 5.1 mmol/L   Chloride 102 98 - 111 mmol/L   CO2 24 22 - 32 mmol/L   Glucose, Bld 115 (H) 70 - 99 mg/dL    Comment: Glucose reference range applies only to samples taken after fasting for at least 8 hours.   BUN 17 8 - 23 mg/dL   Creatinine, Ser 1.02 0.44 -  1.00 mg/dL   Calcium 9.3 8.9 - 11.9 mg/dL   Total Protein 7.9 6.5 - 8.1 g/dL   Albumin 3.6 3.5 - 5.0 g/dL   AST 18 15 - 41 U/L   ALT 10 0 - 44 U/L   Alkaline Phosphatase 67 38 - 126 U/L   Total Bilirubin 0.8 0.0 - 1.2 mg/dL   GFR, Estimated >14 >78 mL/min    Comment: (NOTE) Calculated using the CKD-EPI Creatinine Equation (2021)    Anion gap 9 5 - 15    Comment: Performed at [facility], [address] [place], [place] [address]  CBC     Status: Abnormal   Collection Time: 02/17/23  3:29 AM  Result Value Ref Range   WBC 7.7 4.0 - 10.5 K/uL   RBC 3.86 (L) 3.87 - 5.11 MIL/uL   Hemoglobin 8.0 (L) 12.0 - 15.0 g/dL    Comment: Reticulocyte Hemoglobin testing may be clinically indicated, consider ordering this additional test [id number]    HCT 27.0 (L) 36.0 - 46.0 %   MCV 69.9 (L) 80.0 - 100.0 fL   MCH 20.7 (L) 26.0 - 34.0 pg   MCHC 29.6 (L) 30.0 - 36.0 g/dL   RDW 46.9 (H) 62.9 - 52.8 %   Platelets 311 150 - 400 K/uL   nRBC 0.0 0.0 - 0.2 %    Comment: Performed at [facility], [address] [place], [place] [address]  Comprehensive metabolic panel     Status: Abnormal   Collection Time: 02/17/23  5:25 AM  Result Value Ref Range   Sodium 136 135 - 145 mmol/L   Potassium 3.8  3.5 - 5.1 mmol/L   Chloride 104 98 - 111 mmol/L   CO2 25 22 - 32 mmol/L   Glucose, Bld 102 (H) 70 - 99 mg/dL    Comment: Glucose reference range applies only to samples taken after fasting for at least 8 hours.   BUN 15 8 - 23 mg/dL   Creatinine, Ser 4.01 (L) 0.44 - 1.00 mg/dL   Calcium 9.1 8.9 - 02.7 mg/dL   Total Protein 7.3 6.5 - 8.1 g/dL   Albumin 3.2 (L) 3.5 - 5.0 g/dL   AST 17 15 - 41 U/L   ALT 12 0 - 44 U/L   Alkaline Phosphatase 57 38 - 126 U/L   Total Bilirubin 1.1 0.0 - 1.2 mg/dL   GFR, Estimated >25 >36 mL/min    Comment: (NOTE) Calculated using the CKD-EPI Creatinine Equation (2021)    Anion gap 7 5 - 15    Comment: Performed at [facility], [address], [place], [place] [address]  CBC     Status: Abnormal   Collection Time: 02/17/23  9:07 AM  Result Value Ref Range   WBC 6.7 4.0 - 10.5 K/uL   RBC 3.44 (L) 3.87 - 5.11 MIL/uL   Hemoglobin 7.3 (L) 12.0 - 15.0 g/dL    Comment: Reticulocyte Hemoglobin testing may be clinically indicated, consider ordering this additional test [id number]    HCT 24.5 (L) 36.0 - 46.0 %   MCV 71.2 (L) 80.0 - 100.0 fL   MCH 21.2 (L) 26.0 - 34.0 pg   MCHC 29.8 (L) 30.0 - 36.0 g/dL   RDW 38.7 (H) 56.4 - 33.2 %   Platelets 284 150 - 400 K/uL   nRBC 0.0 0.0 - 0.2 %    Comment: Performed at [facility], [address] [place], [place] [address]  Prepare RBC (crossmatch)     Status: None   Collection Time: 02/17/23  3:00 PM  Result Value Ref Range   Order Confirmation      ORDER PROCESSED BY BLOOD BANK Performed at [facility], [address], [place], [place] [address]   CBC     Status: Abnormal   Collection Time: 02/17/23  3:14 PM  Result Value Ref Range   WBC 7.4 4.0 - 10.5 K/uL   RBC 3.73 (L) 3.87 - 5.11 MIL/uL   Hemoglobin 7.7 (L) 12.0 - 15.0 g/dL    Comment: Reticulocyte Hemoglobin testing may be clinically indicated, consider ordering this additional test  [id number]    HCT 26.6 (L) 36.0 - 46.0 %   MCV 71.3 (L) 80.0 - 100.0 fL   MCH 20.6 (L) 26.0 - 34.0 pg   MCHC 28.9 (L) 30.0 - 36.0 g/dL   RDW 11.9 (H) 14.7 - 82.9 %   Platelets 305 150 - 400 K/uL   nRBC 0.0 0.0 - 0.2 %    Comment: Performed at [facility], [address], [place], [place] [address]  CBC     Status: Abnormal   Collection Time: 02/18/23 12:45 AM  Result Value Ref Range   WBC 9.2 4.0 - 10.5 K/uL   RBC 3.72 (L) 3.87 - 5.11 MIL/uL   Hemoglobin 8.5 (L) 12.0 - 15.0 g/dL    Comment: Reticulocyte Hemoglobin testing may be clinically indicated, consider ordering this additional test [id number]    HCT 27.9 (L) 36.0 - 46.0 %   MCV 75.0 (L) 80.0 - 100.0 fL   MCH 22.8 (L) 26.0 - 34.0 pg   MCHC 30.5 30.0 - 36.0 g/dL   RDW 96.2 (H) 95.2 - 84.1 %   Platelets 272 150 - 400 K/uL   nRBC 0.0 0.0 - 0.2 %    Comment: Performed at [facility], [address] [place], [place] [address]  CBC     Status: Abnormal   Collection Time: 02/18/23  4:54 AM  Result Value Ref Range   WBC 8.7 4.0 - 10.5 K/uL   RBC 3.67 (L) 3.87 - 5.11 MIL/uL   Hemoglobin 8.3 (L) 12.0 - 15.0 g/dL    Comment: Reticulocyte Hemoglobin testing may be clinically indicated, consider ordering this additional test [id number]    HCT 26.9 (L) 36.0 - 46.0 %   MCV 73.3 (L) 80.0 - 100.0 fL   MCH 22.6 (L) 26.0 - 34.0 pg   MCHC 30.9 30.0 - 36.0 g/dL   RDW 64.4 (H) 03.4 - 74.2 %   Platelets 265 150 - 400 K/uL   nRBC 0.0 0.0 - 0.2 %    Comment: Performed at [facility], [address], [place], [place] [address]  Comprehensive metabolic panel     Status: Abnormal   Collection Time: 02/18/23  4:54 AM  Result Value Ref Range   Sodium 133 (L) 135 - 145 mmol/L   Potassium 3.7 3.5 - 5.1 mmol/L   Chloride 101 98 - 111 mmol/L   CO2 21 (L) 22 - 32 mmol/L   Glucose, Bld 87 70 - 99 mg/dL    Comment: Glucose reference range applies only to samples taken after fasting for  at least 8 hours.   BUN 16 8 - 23 mg/dL   Creatinine, Ser 8.75 0.44 - 1.00 mg/dL   Calcium 8.8 (L) 8.9 - 10.3 mg/dL   Total Protein 6.9 6.5 - 8.1 g/dL  Albumin 3.0 (L) 3.5 - 5.0 g/dL   AST 17 15 - 41 U/L   ALT 11 0 - 44 U/L   Alkaline Phosphatase 57 38 - 126 U/L   Total Bilirubin 1.5 (H) 0.0 - 1.2 mg/dL   GFR, Estimated >96 >04 mL/min    Comment: (NOTE) Calculated using the CKD-EPI Creatinine Equation (2021)    Anion gap 11 5 - 15    Comment: Performed at [facility], [address] [place], [place] [address]  VITAMIN D 25 Hydroxy (Vit-D Deficiency, Fractures)     Status: Abnormal   Collection Time: 02/18/23  4:54 AM  Result Value Ref Range   Vit D, 25-Hydroxy 25.46 (L) 30 - 100 ng/mL    Comment: (NOTE) Vitamin D deficiency has been defined by the Institute of Medicine  and an Endocrine Society practice guideline as a level of serum 25-OH  vitamin D less than 20 ng/mL (1,2). The Endocrine Society went on to  further define vitamin D insufficiency as a level between 21 and 29  ng/mL (2).  1. IOM (Institute of Medicine). 2010. Dietary reference intakes for  calcium and D. [place]: [employer]. 2. Holick MF, Binkley [place], Bischoff-Ferrari HA, et al. Evaluation,  treatment, and prevention of vitamin D deficiency: an Endocrine  Society clinical practice guideline, JCEM. 2011 Jul; 96(7): 1911-30.  Performed at [facility], [address], [place], [place] [address]   Lipid panel     Status: Abnormal   Collection Time: 02/18/23  4:54 AM  Result Value Ref Range   Cholesterol 156 0 - 200 mg/dL   Triglycerides 64 <782 mg/dL   HDL 41 >95 mg/dL   Total CHOL/HDL Ratio 3.8 RATIO   VLDL 13 0 - 40 mg/dL   LDL Cholesterol 621 (H) 0 - 99 mg/dL    Comment:        Total Cholesterol/HDL:CHD Risk Coronary Heart Disease Risk Table                     Men   Women  1/2 Average Risk   3.4   3.3  Average Risk       5.0   4.4  2 X Average Risk    9.6   7.1  3 X Average Risk  23.4   11.0        Use the calculated Patient Ratio above and the CHD Risk Table to determine the patient's CHD Risk.        ATP III CLASSIFICATION (LDL):  <100     mg/dL   Optimal  308-657  mg/dL   Near or Above                    Optimal  130-159  mg/dL   Borderline  846-962  mg/dL   High  >952     mg/dL   Very High Performed at [facility], [address], [place], [place] [address]   Hemoglobin A1c     Status: Abnormal   Collection Time: 02/18/23  4:54 AM  Result Value Ref Range   Hgb A1c MFr Bld 5.8 (H) 4.8 - 5.6 %    Comment: (NOTE) Pre diabetes:          5.7%-6.4%  Diabetes:              >6.4%  Glycemic control for   <7.0% adults with diabetes    Mean Plasma  Glucose 119.76 mg/dL    Comment: Performed at [facility], [address], [place], [place] [address]   DG Chest 2 View Result Date: 02/16/2023 CLINICAL DATA:  SOB EXAM: CHEST - 2 VIEW COMPARISON:  01/21/23. FINDINGS: Sternal wires. Post op changes. No consolidation, pneumothorax, effusion or edema. Calcified aorta. Bilateral apical pleural thickening. Degenerative changes along the spine. IMPRESSION: Post op chest.  Chronic changes. Electronically Signed   By: Karen Kays M.D.   On: 02/16/2023 15:04    Anti-infectives (From admission, onward)    None       Assessment/Plan Ascending colon mass Symptomatic anemia  This is a 83 year old female who presented to the ED as above and was noted to have anemia.  She was admitted in December 2024 with fatigue and hemoglobin of 5.4.  Hemoglobin noted to be as low as 7.3 during this admission.  Improved to 8.3 today after 1 unit PRBC.  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 Tattoo performed in the distal ascending colon.  There was also a small likely benign polyp in the proximal transverse colon which was removed with a cold snare.  Although the patient is not currently obstructed and there is no current evidence of bleeding on colonoscopy, this is her second admission with findings of anemia requiring blood transfusion, and therefor would recommend surgery during this admission. I think would be best to have cardiology see her here given her heart history for cardiac risk stratification and optimization.  As she is already prepped for colonoscopy we can keep her on clear liquids while waiting cardiac evaluation and final pathology from the transverse colon polyp to ensure she does not need a larger resection.  Pending this would plan to do surgery likely early next week.  My attending is in agreement with this plan.   Recommendations  - Obtain CT chest abdomen pelvis - ordered.   - CEA ordered and pending  - Obtain cardiology consult for cardiac risk stratification and optimization - Do not advance past CLD  FEN - CLD, IVF per primary  VTE - SCDs ID - None currently.   I reviewed nursing notes, Consultant (GI) notes, hospitalist notes, last 24 h vitals and pain scores, last 48 h intake and output, last 24 h labs and trends, and last 24 h imaging results.   Jacinto Halim, [facility] Surgery 02/18/2023, 11:05 AM Please see Amion for pager number during day hours 7:00am-4:30pm

## 2023-02-18 NOTE — Progress Notes (Signed)
PROGRESS NOTE    Christopher Hink Solimine  ZOX:[id number] DOB: 29-Aug-1940 DOA: 02/16/2023 PCP: Patient, No Pcp Per   Brief Narrative: 83 year old female with history of coronary artery disease, MI, GERD hiatal hernia stroke, hyperlipidemia, peptic ulcer disease, admitted with generalized weakness back pain and right upper extremity pain.  Patient was admitted recently to the hospital with similar complaints and was found to have a hemoglobin of 5.4 she was transfused at that time with 2 units of packed RBCs started on Protonix twice a day, EGD at that time showed small clean-based gastric ulcer with no evidence of active bleeding biopsies were negative for H. pylori.  GI followed the patient at that time.  FOBT is positive this admission.  GI was consulted. Patient reported having dark stools at home after discharge from the hospital.. Assessment & Plan:   Principal Problem:   Symptomatic anemia Active Problems:   Familial hyperlipidemia   Diabetes (HCC)   Hypothyroidism   Adjustment disorder with anxiety   Gout   Leucocytosis   #1 IDA anemia panel from December 2024 with iron of 18 and TIBC elevated with low ferritin at 3 and saturation at 3. Last EGD in December 2024 showed 3 mm gastric ulcer and gastritis with negative biopsy for H. pylori.  Patient continues with anemia of unclear etiology.  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 CT chest abdomen and pelvis for staging    #2 type 2 diabetes she does not take anything at home we will check an A1c CBG (last 3)  No results for input(s): "GLUCAP" in the last 72 hours.   #3 hypothyroidism her last TSH end of next December 2024 was within normal limits apparently she has stopped taking Synthroid  #4 history of ischemic cardiomyopathy not on diuretics prior to admission Last echo January  2020 EF 55 to 60% Echo pending this admission  #5 history of stroke without any residual effects  #6 hyperlipidemia not on statins  #7 B12 deficiency B12 level was 155 in December 2024 on B12 supplements  #8 anxiety on Neurontin carbamazepine   Estimated body mass index is 21.95 kg/m as calculated from the following:   Height as of this encounter: 5\' 2"  (1.575 m).   Weight as of this encounter: 54.4 kg.  DVT prophylaxis: None Code Status: Full Family Communication: None at bedside  disposition Plan:  Status is: Inpatient Remains inpatient appropriate because: Acute illness GI bleed   Consultants:  gi  Procedures: none  Antimicrobials:  Subjective:  She has seen Dr. Katrinka Blazing in the past for her heart but has not seen anyone else since then.  Objective: Vitals:   02/18/23 1038 02/18/23 1040 02/18/23 1050 02/18/23 1100  BP: (!) 139/52 (!) 153/29 (!) 115/22 (!) 144/36  Pulse: (!) 49 (!) 54 (!) 57 (!) 58  Resp: (!) 24 20 (!) 24 (!) 24  Temp:      TempSrc:      SpO2: 100% 100% 98% 98%  Weight:      Height:        Intake/Output Summary (Last 24 hours) at 02/18/2023 1121 Last data filed at 02/18/2023 0300 Gross per 24 hour  Intake 985.46 ml  Output --  Net 985.46 ml   Filed Weights   02/16/23 1418  Weight: 54.4 kg    Examination:  General exam: Appears in no acute distress  respiratory system: Clear to auscultation. Respiratory effort normal. Cardiovascular system: S1 & S2 heard, RRR. No JVD, murmurs, rubs, gallops or clicks. No pedal edema. Gastrointestinal system: Abdomen is nondistended, soft and nontender. No organomegaly or masses felt. Normal bowel sounds heard. Central nervous system: Alert and oriented. No focal neurological deficits. Extremities:no edema  Data Reviewed: I have personally reviewed following labs and imaging studies  CBC: Recent Labs  Lab 02/17/23 0329 02/17/23 0907 02/17/23 1514 02/18/23 0045 02/18/23 0454  WBC 7.7 6.7 7.4 9.2  8.7  HGB 8.0* 7.3* 7.7* 8.5* 8.3*  HCT 27.0* 24.5* 26.6* 27.9* 26.9*  MCV 69.9* 71.2* 71.3* 75.0* 73.3*  PLT 311 284 305 272 265   Basic Metabolic Panel: Recent Labs  Lab 02/16/23 1537 02/17/23 0525 02/18/23 0454  NA 135 136 133*  K 4.0 3.8 3.7  CL 102 104 101  CO2 24 25 21*  GLUCOSE 115* 102* 87  BUN 17 15 16   CREATININE 0.51 0.39* 0.64  CALCIUM 9.3 9.1 8.8*   GFR: Estimated Creatinine Clearance: 44.4 mL/min (by C-G formula based on SCr of 0.64 mg/dL). Liver Function Tests: Recent Labs  Lab 02/16/23 1537 02/17/23 0525 02/18/23 0454  AST 18 17 17   ALT 10 12 11   ALKPHOS 67 57 57  BILITOT 0.8 1.1 1.5*  PROT 7.9 7.3 6.9  ALBUMIN 3.6 3.2* 3.0*   No results for input(s): "LIPASE", "AMYLASE" in the last 168 hours. No results for input(s): "AMMONIA" in the last 168 hours. Coagulation Profile: No results for input(s): "INR", "PROTIME" in the last 168 hours. Cardiac Enzymes: No results for input(s): "CKTOTAL", "CKMB", "CKMBINDEX", "TROPONINI" in the last 168 hours. BNP (last 3 results) No results for input(s): "PROBNP" in the last 8760 hours. HbA1C: Recent Labs    02/18/23 0454  HGBA1C 5.8*   CBG: No results for input(s): "GLUCAP" in the last 168 hours. Lipid Profile: Recent Labs    02/18/23 0454  CHOL 156  HDL 41  LDLCALC 102*  TRIG 64  CHOLHDL 3.8   Thyroid Function Tests: No results for input(s): "TSH", "T4TOTAL", "FREET4", "T3FREE", "THYROIDAB" in the last 72 hours. Anemia Panel: No results for input(s): "VITAMINB12", "FOLATE", "FERRITIN", "TIBC", "IRON", "RETICCTPCT" in the last 72 hours. Sepsis Labs: No results for input(s): "PROCALCITON", "LATICACIDVEN" in the last 168 hours.  No results found for this or any previous visit (from the past 240 hours).   Radiology Studies: DG Chest 2 View Result Date: 02/16/2023 CLINICAL DATA:  SOB EXAM: CHEST - 2 VIEW COMPARISON:  01/21/23. FINDINGS: Sternal wires. Post op changes. No consolidation, pneumothorax,  effusion or edema. Calcified aorta. Bilateral apical pleural thickening. Degenerative changes along the spine. IMPRESSION: Post op chest.  Chronic changes. Electronically Signed   By: Karen Kays M.D.   On: 02/16/2023 15:04    Scheduled Meds:  vitamin D3  2,000 Units Oral Daily   cyanocobalamin  1,000 mcg Oral Daily   iohexol  15 mL Oral Q1 Hr x 2   pantoprazole (PROTONIX) IV  40 mg Intravenous Q12H   Continuous Infusions:     LOS: 2 days    Time spent: 38 min Alwyn Ren, MD  02/18/2023, 11:21 AM

## 2023-02-18 NOTE — Interval H&P Note (Signed)
History and Physical Interval Note:  02/18/2023 8:47 AM  Debra Fry  has presented today for surgery, with the diagnosis of Anemia.  The various methods of treatment have been discussed with the patient and family. After consideration of risks, benefits and other options for treatment, the patient has consented to  Procedure(s): ESOPHAGOGASTRODUODENOSCOPY (EGD) WITH PROPOFOL (N/A) COLONOSCOPY WITH PROPOFOL (N/A) as a surgical intervention.  The patient's history has been reviewed, patient examined, no change in status, stable for surgery.  I have reviewed the patient's chart and labs.  Questions were answered to the patient's satisfaction.     Presly Steinruck

## 2023-02-18 NOTE — Plan of Care (Signed)

## 2023-02-18 NOTE — Anesthesia Procedure Notes (Signed)
Procedure Name: MAC Date/Time: 02/18/2023 9:57 AM  Performed by: Vanessa Pueblito, CRNAOxygen Delivery Method: Nasal cannula

## 2023-02-18 NOTE — Progress Notes (Deleted)
PROGRESS NOTE    Debra Fry  UJW:[id number] DOB: Jun 07, 1940 DOA: 02/16/2023 PCP: Patient, No Pcp Per   Brief Narrative: 83 year old female with history of coronary artery disease, MI, GERD hiatal hernia stroke, hyperlipidemia, peptic ulcer disease, admitted with generalized weakness back pain and right upper extremity pain.  Patient was admitted recently to the hospital with similar complaints and was found to have a hemoglobin of 5.4 she was transfused at that time with 2 units of packed RBCs started on Protonix twice a day, EGD at that time showed small clean-based gastric ulcer with no evidence of active bleeding biopsies were negative for H. pylori.  GI followed the patient at that time.  FOBT is positive this admission.  GI was consulted. Patient reported having dark stools at home after discharge from the hospital.. Assessment & Plan:   Principal Problem:   Symptomatic anemia Active Problems:   Familial hyperlipidemia   Diabetes (HCC)   Hypothyroidism   Adjustment disorder with anxiety   Gout   Leucocytosis   #1 IDA anemia panel from December 2024 with iron of 18 and TIBC elevated with low ferritin at 3 and saturation at 3. Last EGD in December 2024 showed 3 mm gastric ulcer and gastritis with negative biopsy for H. pylori.  Patient continues with anemia of unclear etiology.  EGD and colonoscopy 02/18/2023 shows hiatal hernia and gastritis,no ulcer, mass extending from the ileocecal valve into the proximal ascending colon, biopsies were taken and a small polypectomy was done. CT chest abdomen and pelvis for staging Surgery consulted Cea pending    #2 type 2 diabetes she does not take anything at home we will check an A1c 5.8 CBG (last 3)  No results for input(s): "GLUCAP" in the last 72 hours.   #3 hypothyroidism her last TSH end of next December 2024 was within normal limits apparently she has stopped taking Synthroid  #4 history of ischemic cardiomyopathy not on  diuretics prior to admission Last echo January 2020 EF 55 to 60%  #5 history of stroke without any residual effects  #6 hyperlipidemia not on statins  #7 B12 deficiency B12 level was 155 in December 2024 on B12 supplements  #8 anxiety on Neurontin carbamazepine   Estimated body mass index is 21.95 kg/m as calculated from the following:   Height as of this encounter: 5\' 2"  (1.575 m).   Weight as of this encounter: 54.4 kg.  DVT prophylaxis: None Code Status: Full Family Communication: None at bedside  disposition Plan:  Status is: Inpatient Remains inpatient appropriate because: Acute illness GI bleed   Consultants:  gi  Procedures: none  Antimicrobials:  Subjective:  Seen prior to egd No sob chest pain  Ambulating in room  Objective: Vitals:   02/18/23 1038 02/18/23 1040 02/18/23 1050 02/18/23 1100  BP: (!) 139/52 (!) 153/29 (!) 115/22 (!) 144/36  Pulse: (!) 49 (!) 54 (!) 57 (!) 58  Resp: (!) 24 20 (!) 24 (!) 24  Temp:      TempSrc:      SpO2: 100% 100% 98% 98%  Weight:      Height:        Intake/Output Summary (Last 24 hours) at 02/18/2023 1142 Last data filed at 02/18/2023 0300 Gross per 24 hour  Intake 985.46 ml  Output --  Net 985.46 ml   Filed Weights   02/16/23 1418  Weight: 54.4 kg    Examination:  General exam: Appears in no acute distress  respiratory system: Clear to  auscultation. Respiratory effort normal. Cardiovascular system: S1 & S2 heard, RRR. No JVD, murmurs, rubs, gallops or clicks. No pedal edema. Gastrointestinal system: Abdomen is nondistended, soft and nontender. No organomegaly or masses felt. Normal bowel sounds heard. Central nervous system: Alert and oriented. No focal neurological deficits. Extremities:no edema  Data Reviewed: I have personally reviewed following labs and imaging studies  CBC: Recent Labs  Lab 02/17/23 0329 02/17/23 0907 02/17/23 1514 02/18/23 0045 02/18/23 0454  WBC 7.7 6.7 7.4 9.2 8.7  HGB  8.0* 7.3* 7.7* 8.5* 8.3*  HCT 27.0* 24.5* 26.6* 27.9* 26.9*  MCV 69.9* 71.2* 71.3* 75.0* 73.3*  PLT 311 284 305 272 265   Basic Metabolic Panel: Recent Labs  Lab 02/16/23 1537 02/17/23 0525 02/18/23 0454  NA 135 136 133*  K 4.0 3.8 3.7  CL 102 104 101  CO2 24 25 21*  GLUCOSE 115* 102* 87  BUN 17 15 16   CREATININE 0.51 0.39* 0.64  CALCIUM 9.3 9.1 8.8*   GFR: Estimated Creatinine Clearance: 44.4 mL/min (by C-G formula based on SCr of 0.64 mg/dL). Liver Function Tests: Recent Labs  Lab 02/16/23 1537 02/17/23 0525 02/18/23 0454  AST 18 17 17   ALT 10 12 11   ALKPHOS 67 57 57  BILITOT 0.8 1.1 1.5*  PROT 7.9 7.3 6.9  ALBUMIN 3.6 3.2* 3.0*   No results for input(s): "LIPASE", "AMYLASE" in the last 168 hours. No results for input(s): "AMMONIA" in the last 168 hours. Coagulation Profile: No results for input(s): "INR", "PROTIME" in the last 168 hours. Cardiac Enzymes: No results for input(s): "CKTOTAL", "CKMB", "CKMBINDEX", "TROPONINI" in the last 168 hours. BNP (last 3 results) No results for input(s): "PROBNP" in the last 8760 hours. HbA1C: Recent Labs    02/18/23 0454  HGBA1C 5.8*   CBG: No results for input(s): "GLUCAP" in the last 168 hours. Lipid Profile: Recent Labs    02/18/23 0454  CHOL 156  HDL 41  LDLCALC 102*  TRIG 64  CHOLHDL 3.8   Thyroid Function Tests: No results for input(s): "TSH", "T4TOTAL", "FREET4", "T3FREE", "THYROIDAB" in the last 72 hours. Anemia Panel: No results for input(s): "VITAMINB12", "FOLATE", "FERRITIN", "TIBC", "IRON", "RETICCTPCT" in the last 72 hours. Sepsis Labs: No results for input(s): "PROCALCITON", "LATICACIDVEN" in the last 168 hours.  No results found for this or any previous visit (from the past 240 hours).   Radiology Studies: DG Chest 2 View Result Date: 02/16/2023 CLINICAL DATA:  SOB EXAM: CHEST - 2 VIEW COMPARISON:  01/21/23. FINDINGS: Sternal wires. Post op changes. No consolidation, pneumothorax, effusion  or edema. Calcified aorta. Bilateral apical pleural thickening. Degenerative changes along the spine. IMPRESSION: Post op chest.  Chronic changes. Electronically Signed   By: Karen Kays M.D.   On: 02/16/2023 15:04    Scheduled Meds:  vitamin D3  2,000 Units Oral Daily   cyanocobalamin  1,000 mcg Oral Daily   iohexol  15 mL Oral Q1 Hr x 2   pantoprazole (PROTONIX) IV  40 mg Intravenous Q12H   Continuous Infusions:     LOS: 2 days    Time spent: 38 min Alwyn Ren, MD  02/18/2023, 11:42 AM

## 2023-02-18 NOTE — Anesthesia Postprocedure Evaluation (Signed)
Anesthesia Post Note  Patient: Felicite Zeimet Geary  Procedure(s) Performed: ESOPHAGOGASTRODUODENOSCOPY (EGD) WITH PROPOFOL COLONOSCOPY WITH PROPOFOL BIOPSY SUBMUCOSAL TATTOO INJECTION POLYPECTOMY     Patient location during evaluation: PACU Anesthesia Type: MAC Level of consciousness: awake and alert Pain management: pain level controlled Vital Signs Assessment: post-procedure vital signs reviewed and stable Respiratory status: spontaneous breathing, nonlabored ventilation, respiratory function stable and patient connected to nasal cannula oxygen Cardiovascular status: blood pressure returned to baseline and stable Postop Assessment: no apparent nausea or vomiting Anesthetic complications: no  No notable events documented.  Last Vitals:  Vitals:   02/18/23 1100 02/18/23 1233  BP: (!) 144/36 (!) 166/57  Pulse: (!) 58 62  Resp: (!) 24 16  Temp:  (!) 36.3 C  SpO2: 98% 100%    Last Pain:  Vitals:   02/18/23 1233  TempSrc: Oral  PainSc:                  Shelton Silvas

## 2023-02-18 NOTE — Op Note (Signed)
[facility] Patient Name: Debra Fry Procedure Date: 02/18/2023 MRN: [id number] Attending MD: Kathi Der , MD, [id number] Date of Birth: 01/29/1940 CSN: [id number] Age: 83 Admit Type: Inpatient Procedure:                Upper GI endoscopy Indications:              Iron deficiency anemia Providers:                Kathi Der, MD, Doristine Mango, RN, Priscella Mann, Technician Referring MD:              Medicines:                Sedation Administered by an Anesthesia Professional Complications:            No immediate complications. Estimated Blood Loss:     Estimated blood loss was minimal. Procedure:                Pre-Anesthesia Assessment:                           - Prior to the procedure, a History and Physical                            was performed, and patient medications and                            allergies were reviewed. The patient's tolerance of                            previous anesthesia was also reviewed. The risks                            and benefits of the procedure and the sedation                            options and risks were discussed with the patient.                            All questions were answered, and informed consent                            was obtained. Prior Anticoagulants: The patient has                            taken no anticoagulant or antiplatelet agents. ASA                            Grade Assessment: III - A patient with severe                            systemic disease. After reviewing the risks and  benefits, the patient was deemed in satisfactory                            condition to undergo the procedure.                           After obtaining informed consent, the endoscope was                            passed under direct vision. Throughout the                            procedure, the patient's blood pressure, pulse, and                             oxygen saturations were monitored continuously. The                            [id number]-H190 ([id number]) Olympus endoscope was introduced                            through the mouth, and advanced to the second part                            of duodenum. The upper GI endoscopy was                            accomplished without difficulty. The patient                            tolerated the procedure well. Scope In: Scope Out: Findings:      The Z-line was regular and was found 32 cm from the incisors.      A large hiatal hernia was present.      Patchy mild inflammation was found in the gastric antrum and in the       prepyloric region of the stomach.      The cardia and gastric fundus were normal on retroflexion.      The duodenal bulb, first portion of the duodenum and second portion of       the duodenum were normal.      A non-obstructing Schatzki ring was found at the gastroesophageal       junction. Impression:               - Z-line regular, 32 cm from the incisors.                           - Large hiatal hernia.                           - Gastritis.                           - Normal duodenal bulb, first portion of the  duodenum and second portion of the duodenum.                           - Non-obstructing Schatzki ring.                           - No specimens collected. Moderate Sedation:      Moderate (conscious) sedation was personally administered by an       anesthesia professional. The following parameters were monitored: oxygen       saturation, heart rate, blood pressure, and response to care. Recommendation:           - Perform a colonoscopy today. Procedure Code(s):        --- Professional ---                           [phone], Esophagogastroduodenoscopy, flexible,                            transoral; diagnostic, including collection of                            specimen(s) by brushing or washing, when performed                             (separate procedure) Diagnosis Code(s):        --- Professional ---                           K44.9, Diaphragmatic hernia without obstruction or                            gangrene                           K29.70, Gastritis, unspecified, without bleeding                           D50.9, Iron deficiency anemia, unspecified CPT copyright 2022 American Medical Association. All rights reserved. The codes documented in this report are preliminary and upon coder review may  be revised to meet current compliance requirements. Kathi Der, MD Kathi Der, MD 02/18/2023 10:43:09 AM Number of Addenda: 0

## 2023-02-18 NOTE — Anesthesia Preprocedure Evaluation (Addendum)
Anesthesia Evaluation  Patient identified by MRN, date of birth, ID band Patient awake    Reviewed: Allergy & Precautions, NPO status , Patient's Chart, lab work & pertinent test results  Airway Mallampati: II  TM Distance: >3 FB Neck ROM: Full    Dental  (+) Teeth Intact, Dental Advisory Given   Pulmonary neg pulmonary ROS   breath sounds clear to auscultation       Cardiovascular + CAD, + Past MI and + CABG   Rhythm:Regular Rate:Normal  Echo:  - Left ventricle: The cavity size was normal. Wall thickness was    increased in a pattern of mild LVH. Systolic function was normal.    The estimated ejection fraction was in the range of 55% to 60%.    Wall motion was normal; there were no regional wall motion    abnormalities.  - Mitral valve: Calcified annulus. There was mild regurgitation.      Neuro/Psych  Headaches PSYCHIATRIC DISORDERS       Neuromuscular disease CVA    GI/Hepatic Neg liver ROS, hiatal hernia,GERD  ,,  Endo/Other  diabetesHypothyroidism    Renal/GU negative Renal ROS     Musculoskeletal  (+) Arthritis ,    Abdominal   Peds  Hematology  (+) Blood dyscrasia, anemia   Anesthesia Other Findings   Reproductive/Obstetrics                             Anesthesia Physical Anesthesia Plan  ASA: 3  Anesthesia Plan: MAC   Post-op Pain Management: Tylenol PO (pre-op)*   Induction: Intravenous  PONV Risk Score and Plan: 0 and Propofol infusion  Airway Management Planned: Natural Airway  Additional Equipment: None  Intra-op Plan:   Post-operative Plan:   Informed Consent: I have reviewed the patients History and Physical, chart, labs and discussed the procedure including the risks, benefits and alternatives for the proposed anesthesia with the patient or authorized representative who has indicated his/her understanding and acceptance.       Plan Discussed with:  CRNA  Anesthesia Plan Comments:        Anesthesia Quick Evaluation

## 2023-02-18 NOTE — Op Note (Signed)
[facility] Patient Name: Debra Fry Procedure Date: 02/18/2023 MRN: [id number] Attending MD: Kathi Der , MD, [id number] Date of Birth: [birth year] CSN: [id number] Age: 83 Admit Type: Inpatient Procedure:                Colonoscopy Indications:              This is the patient's first colonoscopy, Iron                            deficiency anemia Providers:                Kathi Der, MD, Doristine Mango, RN, Priscella Mann, Technician Referring MD:              Medicines:                Sedation Administered by an Anesthesia Professional Complications:            No immediate complications. Estimated Blood Loss:     Estimated blood loss was minimal. Procedure:                Pre-Anesthesia Assessment:                           - Prior to the procedure, a History and Physical                            was performed, and patient medications and                            allergies were reviewed. The patient's tolerance of                            previous anesthesia was also reviewed. The risks                            and benefits of the procedure and the sedation                            options and risks were discussed with the patient.                            All questions were answered, and informed consent                            was obtained. Prior Anticoagulants: The patient has                            taken no anticoagulant or antiplatelet agents. ASA                            Grade Assessment: III - A patient with severe  systemic disease. After reviewing the risks and                            benefits, the patient was deemed in satisfactory                            condition to undergo the procedure.                           - Prior to the procedure, a History and Physical                            was performed, and patient medications and                             allergies were reviewed. The patient's tolerance of                            previous anesthesia was also reviewed. The risks                            and benefits of the procedure and the sedation                            options and risks were discussed with the patient.                            All questions were answered, and informed consent                            was obtained. Prior Anticoagulants: The patient has                            taken no anticoagulant or antiplatelet agents. ASA                            Grade Assessment: III - A patient with severe                            systemic disease. After reviewing the risks and                            benefits, the patient was deemed in satisfactory                            condition to undergo the procedure.                           After obtaining informed consent, the colonoscope                            was passed under direct vision. Throughout the  procedure, the patient's blood pressure, pulse, and                            oxygen saturations were monitored continuously. The                            [id number]-HQ190L ([id number]) Olympus colonoscope was                            introduced through the anus and advanced to the the                            cecum, identified by appendiceal orifice and                            ileocecal valve. The colonoscopy was performed                            without difficulty. The patient tolerated the                            procedure well. The quality of the bowel                            preparation was fair. The ileocecal valve,                            appendiceal orifice, and rectum were photographed. Scope In: 10:10:46 AM Scope Out: 10:29:27 AM Scope Withdrawal Time: 0 hours 9 minutes 57 seconds  Total Procedure Duration: 0 hours 18 minutes 41 seconds  Findings:      Hemorrhoids were found on perianal exam.      A  frond-like/villous and polypoid non-obstructing large mass was found       in the proximal ascending colon and at the ileocecal valve. The mass was       partially circumferential (involving two-thirds of the lumen       circumference). The mass measured four cm in length. No bleeding was       present. Biopsies were taken with a cold forceps for histology. Area was       tattooed with an injection of Spot (carbon black).      A 6 mm polyp was found in the proximal transverse colon. The polyp was       sessile. The polyp was removed with a cold snare. Resection and       retrieval were complete.      A few small-mouthed diverticula were found in the sigmoid colon.      Internal hemorrhoids were found during retroflexion. The hemorrhoids       were small. Impression:               - Preparation of the colon was fair.                           - Hemorrhoids found on perianal exam.                           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 Biopsied.                            Tattooed.                           - One 6 mm polyp in the proximal transverse colon,                            removed with a cold snare. Resected and retrieved.                           - Diverticulosis in the sigmoid colon.                           - Internal hemorrhoids. Moderate Sedation:      Moderate (conscious) sedation was personally administered by an       anesthesia professional. The following parameters were monitored: oxygen       saturation, heart rate, blood pressure, and response to care. Recommendation:           - Return patient to hospital ward for ongoing care.                           - Clear liquid diet.                           - Continue present medications.                           - Refer to a colo-rectal surgeon at appointment to                            be scheduled. Procedure Code(s):        ---  Professional ---                           [phone], Colonoscopy, flexible; with removal of                            tumor(s), polyp(s), or other lesion(s) by snare                            technique                           45380, 59, Colonoscopy, flexible; with biopsy,                            single or multiple                           45381, Colonoscopy, flexible; with directed  submucosal injection(s), any substance Diagnosis Code(s):        --- Professional ---                           K64.8, Other hemorrhoids                           D49.0, Neoplasm of unspecified behavior of                            digestive system                           D12.3, Benign neoplasm of transverse colon (hepatic                            flexure or splenic flexure)                           D50.9, Iron deficiency anemia, unspecified                           K57.30, Diverticulosis of large intestine without                            perforation or abscess without bleeding CPT copyright 2022 American Medical Association. All rights reserved. The codes documented in this report are preliminary and upon coder review may  be revised to meet current compliance requirements. Kathi Der, MD Kathi Der, MD 02/18/2023 10:49:01 AM Number of Addenda: 0

## 2023-02-18 NOTE — Transfer of Care (Signed)
Immediate Anesthesia Transfer of Care Note  Patient: Latiana Tomei Grega  Procedure(s) Performed: ESOPHAGOGASTRODUODENOSCOPY (EGD) WITH PROPOFOL COLONOSCOPY WITH PROPOFOL BIOPSY SUBMUCOSAL TATTOO INJECTION POLYPECTOMY  Patient Location: Endoscopy Unit  Anesthesia Type:MAC  Level of Consciousness: drowsy  Airway & Oxygen Therapy: Patient Spontanous Breathing and Patient connected to nasal cannula oxygen  Post-op Assessment: Report given to RN and Post -op Vital signs reviewed and stable  Post vital signs: Reviewed and stable  Last Vitals:  Vitals Value Taken Time  BP 138/22 02/18/23 1036  Temp    Pulse 49 02/18/23 1038  Resp 24 02/18/23 1038  SpO2 100 % 02/18/23 1038  Vitals shown include unfiled device data.  Last Pain:  Vitals:   02/18/23 0826  TempSrc: Temporal  PainSc: 0-No pain         Complications: No notable events documented.

## 2023-02-18 NOTE — Brief Op Note (Addendum)
02/18/2023  10:49 AM  PATIENT:  [name]  83 y.o. female  PRE-OPERATIVE DIAGNOSIS:  Anemia  POST-OPERATIVE DIAGNOSIS:  egd: hiatal hernia  colon: ascending colon mass bx and tattooed and transverse colon polyp  PROCEDURE:  Procedure(s): ESOPHAGOGASTRODUODENOSCOPY (EGD) WITH PROPOFOL (N/A) COLONOSCOPY WITH PROPOFOL (N/A) BIOPSY SUBMUCOSAL TATTOO INJECTION POLYPECTOMY  SURGEON:  Surgeons and Role:    * Perrin Eddleman, MD - Primary  Findings ------------ -EGD showed hiatal hernia and mild gastritis.  No evidence of ulcer disease. 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  Biopsies taken.  Tattoo performed in the distal ascending colon.  There was small likely benign polyp in the proximal transverse colon which was removed with a cold snare.  Recommendations ------------------------- -Get CT chest abdomen pelvis with IV contrast for staging -Check CEA -Recommend surgical consult -Okay to have clear liquid diet -GI will follow  Kathi Der MD, FACP 02/18/2023, 10:50 AM  Contact #  [phone]

## 2023-02-19 ENCOUNTER — Inpatient Hospital Stay (HOSPITAL_COMMUNITY): Payer: Medicare HMO

## 2023-02-19 DIAGNOSIS — D649 Anemia, unspecified: Secondary | ICD-10-CM | POA: Diagnosis not present

## 2023-02-19 DIAGNOSIS — R9431 Abnormal electrocardiogram [ECG] [EKG]: Secondary | ICD-10-CM

## 2023-02-19 LAB — COMPREHENSIVE METABOLIC PANEL
ALT: 14 U/L (ref 0–44)
AST: 19 U/L (ref 15–41)
Albumin: 3.2 g/dL — ABNORMAL LOW (ref 3.5–5.0)
Alkaline Phosphatase: 63 U/L (ref 38–126)
Anion gap: 11 (ref 5–15)
BUN: 13 mg/dL (ref 8–23)
CO2: 25 mmol/L (ref 22–32)
Calcium: 9 mg/dL (ref 8.9–10.3)
Chloride: 100 mmol/L (ref 98–111)
Creatinine, Ser: 0.77 mg/dL (ref 0.44–1.00)
GFR, Estimated: >60 mL/min (ref >60)
Glucose, Bld: 93 mg/dL (ref 70–99)
Potassium: 3.9 mmol/L (ref 3.5–5.1)
Sodium: 136 mmol/L (ref 135–145)
Total Bilirubin: 1.4 mg/dL — ABNORMAL HIGH (ref 0.0–1.2)
Total Protein: 7.5 g/dL (ref 6.5–8.1)

## 2023-02-19 LAB — ECHOCARDIOGRAM COMPLETE
Area-P 1/2: 3.72 cm2
Calc EF: 58.4 %
Height: 62 in
S' Lateral: 3 cm
Single Plane A2C EF: 49.4 %
Single Plane A4C EF: 66.4 %
Weight: 1920 [oz_av]

## 2023-02-19 LAB — CBC
HCT: 29.8 % — ABNORMAL LOW (ref 36.0–46.0)
Hemoglobin: 9.1 g/dL — ABNORMAL LOW (ref 12.0–15.0)
MCH: 22.5 pg — ABNORMAL LOW (ref 26.0–34.0)
MCHC: 30.5 g/dL (ref 30.0–36.0)
MCV: 73.6 fL — ABNORMAL LOW (ref 80.0–100.0)
Platelets: 306 10*3/uL (ref 150–400)
RBC: 4.05 MIL/uL (ref 3.87–5.11)
RDW: 24.1 % — ABNORMAL HIGH (ref 11.5–15.5)
WBC: 7.7 10*3/uL (ref 4.0–10.5)
nRBC: 0 % (ref 0.0–0.2)

## 2023-02-19 LAB — CEA: CEA: 0.8 ng/mL (ref 0.0–4.7)

## 2023-02-19 MED ORDER — VITAMIN D (ERGOCALCIFEROL) 1.25 MG (50000 UNIT) PO CAPS
50000.0000 [IU] | ORAL_CAPSULE | ORAL | Status: DC
  Administered 2023-02-19 – 2023-02-26 (×2): 50000 [IU] via ORAL
  Filled 2023-02-19 (×2): qty 1

## 2023-02-19 MED ORDER — PERFLUTREN LIPID MICROSPHERE
1.0000 mL | INTRAVENOUS | Status: AC | PRN
  Administered 2023-02-19: 1 mL via INTRAVENOUS

## 2023-02-19 NOTE — Progress Notes (Signed)
1 Day Post-Op   Subjective/Chief Complaint: Patient without complaint.  Awaiting pathology.   Objective: Vital signs in last 24 hours: Temp:  [97.4 F (36.3 C)-100 F (37.8 C)] 99.3 F (37.4 C) (01/25 0421) Pulse Rate:  [48-79] 72 (01/25 0400) Resp:  [14-24] 14 (01/25 0400) BP: (115-174)/(22-57) 140/41 (01/25 0400) SpO2:  [94 %-100 %] 94 % (01/25 0400) Last BM Date : 02/19/23  Intake/Output from previous day: No intake/output data recorded. Intake/Output this shift: Total I/O In: 120 [P.O.:120] Out: -   Abdomen: Soft nontender without rebound guarding  Lab Results:  Recent Labs    02/18/23 0454 02/19/23 0723  WBC 8.7 7.7  HGB 8.3* 9.1*  HCT 26.9* 29.8*  PLT 265 306   BMET Recent Labs    02/18/23 0454 02/19/23 0723  NA 133* 136  K 3.7 3.9  CL 101 100  CO2 21* 25  GLUCOSE 87 93  BUN 16 13  CREATININE 0.64 0.77  CALCIUM 8.8* 9.0   PT/INR No results for input(s): "LABPROT", "INR" in the last 72 hours. ABG No results for input(s): "PHART", "HCO3" in the last 72 hours.  Invalid input(s): "PCO2", "PO2"  Studies/Results: CT CHEST ABDOMEN PELVIS W CONTRAST Result Date: 02/18/2023 CLINICAL DATA:  New diagnosis colon cancer, staging * Tracking Code: BO * EXAM: CT CHEST, ABDOMEN, AND PELVIS WITH CONTRAST TECHNIQUE: Multidetector CT imaging of the chest, abdomen and pelvis was performed following the standard protocol during bolus administration of intravenous contrast. RADIATION DOSE REDUCTION: This exam was performed according to the departmental dose-optimization program which includes automated exposure control, adjustment of the mA and/or kV according to patient size and/or use of iterative reconstruction technique. CONTRAST:  OMNIPAQUE IOHEXOL 300 MG/ML SOLN additional oral enteric contrast COMPARISON:  None Available. FINDINGS: CT CHEST FINDINGS Cardiovascular: Aortic atherosclerosis. Cardiomegaly. Three-vessel coronary artery calcifications status post  median sternotomy and CABG. No pericardial effusion. Mediastinum/Nodes: No enlarged mediastinal, hilar, or axillary lymph nodes. Moderate hiatal hernia with intrathoracic position of the gastric fundus. Thyroid gland, trachea, and esophagus demonstrate no significant findings. Lungs/Pleura: Lungs are clear. No pleural effusion or pneumothorax. Musculoskeletal: No chest wall abnormality. No acute osseous findings. CT ABDOMEN PELVIS FINDINGS Hepatobiliary: No solid liver abnormality is seen. No gallstones, gallbladder wall thickening, or biliary dilatation. Pancreas: Unremarkable. No pancreatic ductal dilatation or surrounding inflammatory changes. Spleen: Normal in size without significant abnormality. Adrenals/Urinary Tract: Adrenal glands are unremarkable. Kidneys are normal, without renal calculi, solid lesion, or hydronephrosis. Bladder is unremarkable. Stomach/Bowel: Stomach is within normal limits. Sessile mass of the cecum just superior to the ileocecal valve, approximately 4.8 x 3.5 cm (series 6, image 40, series 2, image 84). Sigmoid diverticulosis. Vascular/Lymphatic: Severe aortic atherosclerosis. Enlarged lymph nodes within the right lower quadrant mesocolon adjacent to cecal mass, measuring up to 0.9 x 0.6 cm (series 2, image 80). Reproductive: No mass or other abnormality. Other: No abdominal wall hernia or abnormality. No ascites. Musculoskeletal: No acute osseous findings. IMPRESSION: 1. Sessile mass of the cecum just superior to the ileocecal valve, consistent with primary colon malignancy. 2. Enlarged lymph nodes within the right lower quadrant mesocolon adjacent to cecal mass, consistent with nodal metastases. 3. No evidence of distant metastatic disease in the chest, abdomen, or pelvis. 4. Sigmoid diverticulosis without evidence of acute diverticulitis. 5. Moderate hiatal hernia with intrathoracic position of the gastric fundus. 6. Cardiomegaly and coronary artery disease. Aortic Atherosclerosis  (ICD10-I70.0). Electronically Signed   By: Jearld Lesch M.D.   On: 02/18/2023 17:11  Anti-infectives: Anti-infectives (From admission, onward)    None       Assessment/Plan: Ascending colon mass Symptomatic anemia   Path pending.  Will need a partial colectomy for this.  Will start gentle bowel prep tomorrow to potentially do it next week.  Depending on OR availability and surgeon availability, this may change.  Discussed with patient.  Okay to allow clears for today.   LOS: 3 days    Dortha Schwalbe 02/19/2023 Straightforward

## 2023-02-19 NOTE — Plan of Care (Signed)

## 2023-02-19 NOTE — Plan of Care (Signed)

## 2023-02-19 NOTE — Progress Notes (Signed)
PROGRESS NOTE    Debra Fry  NWG:[id number] DOB: 10-13-1940 DOA: 02/16/2023 PCP: Patient, No Pcp Per   Brief Narrative: 83 year old female with history of coronary artery disease, MI, GERD hiatal hernia stroke, hyperlipidemia, peptic ulcer disease, admitted with generalized weakness back pain and right upper extremity pain.  Patient was admitted recently to the hospital with similar complaints and was found to have a hemoglobin of 5.4 she was transfused at that time with 2 units of packed RBCs started on Protonix twice a day, EGD at that time showed small clean-based gastric ulcer with no evidence of active bleeding biopsies were negative for H. pylori.  GI followed the patient at that time.  FOBT is positive this admission.  GI was consulted. Patient reported having dark stools at home after discharge from the hospital.. Assessment & Plan:   Principal Problem:   Symptomatic anemia Active Problems:   Familial hyperlipidemia   Diabetes (HCC)   Hypothyroidism   Adjustment disorder with anxiety   Gout   Leucocytosis   #1 IDA anemia panel from December 2024 with iron of 18 and TIBC elevated with low ferritin at 3 and saturation at 3. Last EGD in December 2024 showed 3 mm gastric ulcer and gastritis with negative biopsy for H. pylori.  Patient continues with anemia of unclear etiology.  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 CT chest abdomen and pelvis for staging    #2 type 2 diabetes she does not take anything at home we will check an A1c CBG (last 3)  No results for input(s): "GLUCAP" in the last 72 hours.   #3 hypothyroidism her last TSH end of next December 2024 was within normal limits apparently she has stopped taking Synthroid  #4 history of ischemic cardiomyopathy not on diuretics prior to admission Last echo January  2020 EF 55 to 60% Echo pending this admission  #5 history of stroke without any residual effects  #6 hyperlipidemia not on statins  #7 B12 deficiency B12 level was 155 in December 2024 on B12 supplements  #8 anxiety on Neurontin carbamazepine  #9 vitamin D deficiency start replacement   Estimated body mass index is 21.95 kg/m as calculated from the following:   Height as of this encounter: 5\' 2"  (1.575 m).   Weight as of this encounter: 54.4 kg.  DVT prophylaxis: None Code Status: Full Family Communication: None at bedside  disposition Plan:  Status is: Inpatient Remains inpatient appropriate because: Acute illness GI bleed   Consultants:  gi  Procedures: none  Antimicrobials:  Subjective:  Up in room just nervous about the oncoming procedures No chest pain shortness of breath nausea vomiting reported  Objective: Vitals:   02/18/23 2103 02/19/23 0400 02/19/23 0421 02/19/23 1252  BP: (!) 174/56 (!) 140/41  (!) 140/51  Pulse: 79 72  65  Resp:  14  20  Temp: 98.5 F (36.9 C) 100 F (37.8 C) 99.3 F (37.4 C) 98.1 F (36.7 C)  TempSrc: Oral Oral Oral Oral  SpO2: 98% 94%  98%  Weight:      Height:        Intake/Output Summary (Last 24 hours) at 02/19/2023 1310 Last data filed at 02/19/2023 1100 Gross per 24 hour  Intake 360 ml  Output --  Net 360 ml   Filed Weights   02/16/23 1418  Weight: 54.4 kg  Examination:  General exam: Appears in no acute distress  respiratory system: Clear to auscultation. Respiratory effort normal. Cardiovascular system: S1 & S2 heard, RRR. No JVD, murmurs, rubs, gallops or clicks. No pedal edema. Gastrointestinal system: Abdomen is nondistended, soft and nontender. No organomegaly or masses felt. Normal bowel sounds heard. Central nervous system: Alert and oriented. No focal neurological deficits. Extremities:no edema  Data Reviewed: I have personally reviewed following labs and imaging studies  CBC: Recent Labs  Lab  02/17/23 0907 02/17/23 1514 02/18/23 0045 02/18/23 0454 02/19/23 0723  WBC 6.7 7.4 9.2 8.7 7.7  HGB 7.3* 7.7* 8.5* 8.3* 9.1*  HCT 24.5* 26.6* 27.9* 26.9* 29.8*  MCV 71.2* 71.3* 75.0* 73.3* 73.6*  PLT 284 305 272 265 306   Basic Metabolic Panel: Recent Labs  Lab 02/16/23 1537 02/17/23 0525 02/18/23 0454 02/19/23 0723  NA 135 136 133* 136  K 4.0 3.8 3.7 3.9  CL 102 104 101 100  CO2 24 25 21* 25  GLUCOSE 115* 102* 87 93  BUN 17 15 16 13   CREATININE 0.51 0.39* 0.64 0.77  CALCIUM 9.3 9.1 8.8* 9.0   GFR: Estimated Creatinine Clearance: 44.4 mL/min (by C-G formula based on SCr of 0.77 mg/dL). Liver Function Tests: Recent Labs  Lab 02/16/23 1537 02/17/23 0525 02/18/23 0454 02/19/23 0723  AST 18 17 17 19   ALT 10 12 11 14   ALKPHOS 67 57 57 63  BILITOT 0.8 1.1 1.5* 1.4*  PROT 7.9 7.3 6.9 7.5  ALBUMIN 3.6 3.2* 3.0* 3.2*   No results for input(s): "LIPASE", "AMYLASE" in the last 168 hours. No results for input(s): "AMMONIA" in the last 168 hours. Coagulation Profile: No results for input(s): "INR", "PROTIME" in the last 168 hours. Cardiac Enzymes: No results for input(s): "CKTOTAL", "CKMB", "CKMBINDEX", "TROPONINI" in the last 168 hours. BNP (last 3 results) No results for input(s): "PROBNP" in the last 8760 hours. HbA1C: Recent Labs    02/18/23 0454  HGBA1C 5.8*   CBG: No results for input(s): "GLUCAP" in the last 168 hours. Lipid Profile: Recent Labs    02/18/23 0454  CHOL 156  HDL 41  LDLCALC 102*  TRIG 64  CHOLHDL 3.8   Thyroid Function Tests: No results for input(s): "TSH", "T4TOTAL", "FREET4", "T3FREE", "THYROIDAB" in the last 72 hours. Anemia Panel: No results for input(s): "VITAMINB12", "FOLATE", "FERRITIN", "TIBC", "IRON", "RETICCTPCT" in the last 72 hours. Sepsis Labs: No results for input(s): "PROCALCITON", "LATICACIDVEN" in the last 168 hours.  No results found for this or any previous visit (from the past 240 hours).   Radiology  Studies: CT CHEST ABDOMEN PELVIS W CONTRAST Result Date: 02/18/2023 CLINICAL DATA:  New diagnosis colon cancer, staging * Tracking Code: BO * EXAM: CT CHEST, ABDOMEN, AND PELVIS WITH CONTRAST TECHNIQUE: Multidetector CT imaging of the chest, abdomen and pelvis was performed following the standard protocol during bolus administration of intravenous contrast. RADIATION DOSE REDUCTION: This exam was performed according to the departmental dose-optimization program which includes automated exposure control, adjustment of the mA and/or kV according to patient size and/or use of iterative reconstruction technique. CONTRAST:  OMNIPAQUE IOHEXOL 300 MG/ML SOLN additional oral enteric contrast COMPARISON:  None Available. FINDINGS: CT CHEST FINDINGS Cardiovascular: Aortic atherosclerosis. Cardiomegaly. Three-vessel coronary artery calcifications status post median sternotomy and CABG. No pericardial effusion. Mediastinum/Nodes: No enlarged mediastinal, hilar, or axillary lymph nodes. Moderate hiatal hernia with intrathoracic position of the gastric fundus. Thyroid gland, trachea, and esophagus demonstrate no significant findings. Lungs/Pleura: Lungs are clear. No pleural  effusion or pneumothorax. Musculoskeletal: No chest wall abnormality. No acute osseous findings. CT ABDOMEN PELVIS FINDINGS Hepatobiliary: No solid liver abnormality is seen. No gallstones, gallbladder wall thickening, or biliary dilatation. Pancreas: Unremarkable. No pancreatic ductal dilatation or surrounding inflammatory changes. Spleen: Normal in size without significant abnormality. Adrenals/Urinary Tract: Adrenal glands are unremarkable. Kidneys are normal, without renal calculi, solid lesion, or hydronephrosis. Bladder is unremarkable. Stomach/Bowel: Stomach is within normal limits. Sessile mass of the cecum just superior to the ileocecal valve, approximately 4.8 x 3.5 cm (series 6, image 40, series 2, image 84). Sigmoid diverticulosis.  Vascular/Lymphatic: Severe aortic atherosclerosis. Enlarged lymph nodes within the right lower quadrant mesocolon adjacent to cecal mass, measuring up to 0.9 x 0.6 cm (series 2, image 80). Reproductive: No mass or other abnormality. Other: No abdominal wall hernia or abnormality. No ascites. Musculoskeletal: No acute osseous findings. IMPRESSION: 1. Sessile mass of the cecum just superior to the ileocecal valve, consistent with primary colon malignancy. 2. Enlarged lymph nodes within the right lower quadrant mesocolon adjacent to cecal mass, consistent with nodal metastases. 3. No evidence of distant metastatic disease in the chest, abdomen, or pelvis. 4. Sigmoid diverticulosis without evidence of acute diverticulitis. 5. Moderate hiatal hernia with intrathoracic position of the gastric fundus. 6. Cardiomegaly and coronary artery disease. Aortic Atherosclerosis (ICD10-I70.0). Electronically Signed   By: Jearld Lesch M.D.   On: 02/18/2023 17:11    Scheduled Meds:  vitamin D3  2,000 Units Oral Daily   cyanocobalamin  1,000 mcg Oral Daily   pantoprazole (PROTONIX) IV  40 mg Intravenous Q12H   Continuous Infusions:     LOS: 3 days    Time spent: 38 min Alwyn Ren, MD  02/19/2023, 1:10 PM

## 2023-02-19 NOTE — Progress Notes (Signed)
[facility] Gastroenterology Progress Note  Debra Fry 83 y.o. 05-19-1940  CC: Anemia, colon cancer   Subjective: Patient seen and examined at bedside.  Denies any acute GI issues.  ROS : Afebrile, negative for chest pain   Objective: Vital signs in last 24 hours: Vitals:   02/19/23 0400 02/19/23 0421  BP: (!) 140/41   Pulse: 72   Resp: 14   Temp: 100 F (37.8 C) 99.3 F (37.4 C)  SpO2: 94%     Physical Exam:  General:  Alert, cooperative, no distress, appears stated age  Head:  Normocephalic, without obvious abnormality, atraumatic  Eyes:  , EOM's intact,   Lungs:   Clear to auscultation bilaterally, respirations unlabored  Heart:  Regular rate and rhythm, S1, S2 normal  Abdomen:   Soft, non-tender, bowel sounds active all four quadrants,  no masses,   Extremities: Extremities normal, atraumatic, no  edema  Pulses: 2+ and symmetric    Lab Results: Recent Labs    02/18/23 0454 02/19/23 0723  NA 133* 136  K 3.7 3.9  CL 101 100  CO2 21* 25  GLUCOSE 87 93  BUN 16 13  CREATININE 0.64 0.77  CALCIUM 8.8* 9.0   Recent Labs    02/18/23 0454 02/19/23 0723  AST 17 19  ALT 11 14  ALKPHOS 57 63  BILITOT 1.5* 1.4*  PROT 6.9 7.5  ALBUMIN 3.0* 3.2*   Recent Labs    02/18/23 0454 02/19/23 0723  WBC 8.7 7.7  HGB 8.3* 9.1*  HCT 26.9* 29.8*  MCV 73.3* 73.6*  PLT 265 306   No results for input(s): "LABPROT", "INR" in the last 72 hours.    Assessment/Plan: -Colon Mass  Colonoscopy yesterday showed proximal ascending colon mass.  Likely malignant.  Patient with elevated CEA.  CT chest abdomen pelvis showed nodal metastasis but no distant metastasis. -Anemia.  Likely from above. -History of gastritis and gastric ulcer.  EGD yesterday showed no evidence of ulcer disease.  Recommendations ------------------------- -Appreciate surgery consult.  Further management per surgical team. -Awaiting pathology.  She will need repeat colonoscopy in 1 year. -No  further inpatient GI workup planned.  GI will sign off.  Call us back if needed.   Kathi Der MD, FACP 02/19/2023, 9:41 AM  Contact #  [phone]

## 2023-02-19 NOTE — Consult Note (Signed)
Cardiology Consultation   Patient ID: Lorenia Hoston Estill MRN: [id number]; DOB: [birth year]  Admit date: 02/16/2023 Date of Consult: 02/19/2023  PCP:  Patient, No Pcp Per   [facility] Providers Cardiologist:  None        Patient Profile:   Vung Kush Hackmann is a 83 y.o. female with a hx of chronic diastolic heart failure followed with Dr. Katrinka Blazing about 20+ years ago when she was in her 54s, history of coronary artery bypass grafting in her 21s x 4 she tells me, has not followed with cardiology since that time, normal ejection fraction 55 to 60%, hyperlipidemia, history of CVA, GERD, history of coronary artery disease in the past who is being seen 02/19/2023 for the evaluation of preoperative clearance at the request of Dr. Ashley Royalty.  History of Present Illness:   Ms. Jensen was recently admitted due to generalized weakness back pain and right upper extremity pain.  During her hospital admission she was found to be significantly anemic with a hemoglobin of 5.4.  She was transfused 2 packed red blood cells.  She has been started on proton pump inhibitors.  She did undergo endoscopy EGD showed small clean base gastric ulcer with no active bleeding, noted hiatal hernia and gastritis.  Meantime she also had CT of the abdomen which showed large mass at the hepatic flexure.  General neurosurgery has been following the patient with plans for surgery likely early next week.   At this time cardiology has been consulted for preoperative clearance.  During our visit she shared with me during the time of her CABG she had an MI which symptoms noted that she felt very weak with lack of energy 4 weeks prior to presenting to the hospital.  At that time she underwent a heart catheterization and was noted to have multivessel disease and proceeded to CABG.  Lately she does not report any symptoms of chest pain but says she has occasional shortness of breath.  Mostly during exertion and when she is  particularly outside in the cold.  She has not seen a cardiologist over 20 years because she tells me she did not see the need to and had not experienced any symptoms.  Today she denied any chest pain or shortness of breath.  Though she has not really been exerting herself while in the hospital.   Past Medical History:  Diagnosis Date   Coronary artery disease    GERD (gastroesophageal reflux disease)    Headache    History of hiatal hernia    Hyperlipidemia    Myocardial infarction (HCC) 12/1999   Stroke [facility])     Past Surgical History:  Procedure Laterality Date   BIOPSY  01/23/2023   Procedure: BIOPSY;  Surgeon: Charlott Rakes, MD;  Location: [facility];  Service: Gastroenterology;;   CARDIAC CATHETERIZATION  2001   results in Echart conversion   CORONARY ARTERY BYPASS GRAFT  2001   ENDARTERECTOMY Left 02/13/2018   Procedure: ENDARTERECTOMY CAROTID;  Surgeon: Sherren Kerns, MD;  Location: [facility] OR;  Service: Vascular;  Laterality: Left;   ESOPHAGOGASTRODUODENOSCOPY (EGD) WITH PROPOFOL N/A 01/23/2023   Procedure: ESOPHAGOGASTRODUODENOSCOPY (EGD) WITH PROPOFOL;  Surgeon: Charlott Rakes, MD;  Location: [facility];  Service: Gastroenterology;  Laterality: N/A;   OPEN REDUCTION INTERNAL FIXATION (ORIF) DISTAL RADIAL FRACTURE Left 11/23/2018   Procedure: OPEN REDUCTION INTERNAL FIXATION (ORIF) LEFT DISTAL RADIAL AND ULNA  FRACTURES;  Surgeon: Betha Loa, MD;  Location: [facility];  Service: Orthopedics;  Laterality:  Left;       Inpatient Medications: Scheduled Meds:  vitamin D3  2,000 Units Oral Daily   cyanocobalamin  1,000 mcg Oral Daily   pantoprazole (PROTONIX) IV  40 mg Intravenous Q12H   Continuous Infusions:  PRN Meds: carbamazepine, gabapentin, morphine injection, ondansetron **OR** ondansetron (ZOFRAN) IV  Allergies:    Allergies  Allergen Reactions   Atorvastatin     Knee pain    Social History:   Social History   Socioeconomic  History   Marital status: Married    Spouse name: Not on file   Number of children: Not on file   Years of education: Not on file   Highest education level: Not on file  Occupational History   Not on file  Tobacco Use   Smoking status: Never   Smokeless tobacco: Never  Vaping Use   Vaping status: Never Used  Substance and Sexual Activity   Alcohol use: No   Drug use: No   Sexual activity: Not on file  Other Topics Concern   Not on file  Social History Narrative   Not on file   Social Drivers of Health   Financial Resource Strain: Not on file  Food Insecurity: No Food Insecurity (02/17/2023)   Hunger Vital Sign    Worried About Running Out of Food in the Last Year: Never [place]    Ran Out of Food in the Last Year: Never [place]  Transportation Needs: No Transportation Needs (02/17/2023)   PRAPARE - Administrator, Civil Service (Medical): No    Lack of Transportation (Non-Medical): No  Physical Activity: Not on file  Stress: Not on file  Social Connections: Moderately Integrated (02/17/2023)   Social Connection and Isolation Panel [NHANES]    Frequency of Communication with Friends and Family: Three times a week    Frequency of Social Gatherings with Friends and Family: Once a week    Attends Religious Services: More than 4 times per year    Active Member of [employer] or Organizations: Yes    Attends Banker Meetings: More than 4 times per year    Marital Status: Widowed  Intimate Partner Violence: Not At Risk (02/17/2023)   Humiliation, Afraid, Rape, and Kick questionnaire    Fear of Current or Ex-Partner: No    Emotionally Abused: No    Physically Abused: No    Sexually Abused: No    Family History:    Family History  Family history unknown: Yes     ROS:  Please see the history of present illness.   All other ROS reviewed and negative.     Physical Exam/Data:   Vitals:   02/18/23 1233 02/18/23 2103 02/19/23 0400 02/19/23 0421  BP: (!)  166/57 (!) 174/56 (!) 140/41   Pulse: 62 79 72   Resp: 16  14   Temp: (!) 97.4 F (36.3 C) 98.5 F (36.9 C) 100 F (37.8 C) 99.3 F (37.4 C)  TempSrc: Oral Oral Oral Oral  SpO2: 100% 98% 94%   Weight:      Height:        Intake/Output Summary (Last 24 hours) at 02/19/2023 0959 Last data filed at 02/19/2023 4098 Gross per 24 hour  Intake 120 ml  Output --  Net 120 ml      02/16/2023    2:18 PM 01/22/2023    1:35 AM 09/15/2020    3:56 PM  Last 3 Weights  Weight (lbs) 120 lb 122 lb 9.2 oz  137 lb 12.8 oz  Weight (kg) 54.432 kg 55.6 kg 62.506 kg     Body mass index is 21.95 kg/m.  General:  Well nourished, well developed, in no acute distress HEENT: normal Neck: no JVD Vascular: No carotid bruits; Distal pulses 2+ bilaterally Cardiac:  normal S1, S2; RRR; no murmur  Lungs:  clear to auscultation bilaterally, no wheezing, rhonchi or rales  Abd: soft, nontender, no hepatomegaly  Ext: no edema Musculoskeletal:  No deformities, BUE and BLE strength normal and equal Skin: warm and dry  Neuro:  CNs 2-12 intact, no focal abnormalities noted Psych:  Normal affect   EKG:  The EKG was personally reviewed and demonstrates: Sinus rhythm with nonspecific ST changes Telemetry:  Telemetry was personally reviewed and demonstrates: Sinus rhythm  Relevant CV Studies: Echo from 2020  Laboratory Data:  High Sensitivity Troponin:   Recent Labs  Lab 01/21/23 1852  TROPONINIHS 9     Chemistry Recent Labs  Lab 02/17/23 0525 02/18/23 0454 02/19/23 0723  NA 136 133* 136  K 3.8 3.7 3.9  CL 104 101 100  CO2 25 21* 25  GLUCOSE 102* 87 93  BUN 15 16 13   CREATININE 0.39* 0.64 0.77  CALCIUM 9.1 8.8* 9.0  GFRNONAA >60 >60 >60  ANIONGAP 7 11 11     Recent Labs  Lab 02/17/23 0525 02/18/23 0454 02/19/23 0723  PROT 7.3 6.9 7.5  ALBUMIN 3.2* 3.0* 3.2*  AST 17 17 19   ALT 12 11 14   ALKPHOS 57 57 63  BILITOT 1.1 1.5* 1.4*   Lipids  Recent Labs  Lab 02/18/23 0454  CHOL 156   TRIG 64  HDL 41  LDLCALC 102*  CHOLHDL 3.8    Hematology Recent Labs  Lab 02/18/23 0045 02/18/23 0454 02/19/23 0723  WBC 9.2 8.7 7.7  RBC 3.72* 3.67* 4.05  HGB 8.5* 8.3* 9.1*  HCT 27.9* 26.9* 29.8*  MCV 75.0* 73.3* 73.6*  MCH 22.8* 22.6* 22.5*  MCHC 30.5 30.9 30.5  RDW 23.7* 23.8* 24.1*  PLT 272 265 306   Thyroid No results for input(s): "TSH", "FREET4" in the last 168 hours.  BNPNo results for input(s): "BNP", "PROBNP" in the last 168 hours.  DDimer No results for input(s): "DDIMER" in the last 168 hours.   Radiology/Studies:  CT CHEST ABDOMEN PELVIS W CONTRAST Result Date: 02/18/2023 CLINICAL DATA:  New diagnosis colon cancer, staging * Tracking Code: BO * EXAM: CT CHEST, ABDOMEN, AND PELVIS WITH CONTRAST TECHNIQUE: Multidetector CT imaging of the chest, abdomen and pelvis was performed following the standard protocol during bolus administration of intravenous contrast. RADIATION DOSE REDUCTION: This exam was performed according to the departmental dose-optimization program which includes automated exposure control, adjustment of the mA and/or kV according to patient size and/or use of iterative reconstruction technique. CONTRAST:  OMNIPAQUE IOHEXOL 300 MG/ML SOLN additional oral enteric contrast COMPARISON:  None Available. FINDINGS: CT CHEST FINDINGS Cardiovascular: Aortic atherosclerosis. Cardiomegaly. Three-vessel coronary artery calcifications status post median sternotomy and CABG. No pericardial effusion. Mediastinum/Nodes: No enlarged mediastinal, hilar, or axillary lymph nodes. Moderate hiatal hernia with intrathoracic position of the gastric fundus. Thyroid gland, trachea, and esophagus demonstrate no significant findings. Lungs/Pleura: Lungs are clear. No pleural effusion or pneumothorax. Musculoskeletal: No chest wall abnormality. No acute osseous findings. CT ABDOMEN PELVIS FINDINGS Hepatobiliary: No solid liver abnormality is seen. No gallstones, gallbladder wall  thickening, or biliary dilatation. Pancreas: Unremarkable. No pancreatic ductal dilatation or surrounding inflammatory changes. Spleen: Normal in size without significant abnormality. Adrenals/Urinary  Tract: Adrenal glands are unremarkable. Kidneys are normal, without renal calculi, solid lesion, or hydronephrosis. Bladder is unremarkable. Stomach/Bowel: Stomach is within normal limits. Sessile mass of the cecum just superior to the ileocecal valve, approximately 4.8 x 3.5 cm (series 6, image 40, series 2, image 84). Sigmoid diverticulosis. Vascular/Lymphatic: Severe aortic atherosclerosis. Enlarged lymph nodes within the right lower quadrant mesocolon adjacent to cecal mass, measuring up to 0.9 x 0.6 cm (series 2, image 80). Reproductive: No mass or other abnormality. Other: No abdominal wall hernia or abnormality. No ascites. Musculoskeletal: No acute osseous findings. IMPRESSION: 1. Sessile mass of the cecum just superior to the ileocecal valve, consistent with primary colon malignancy. 2. Enlarged lymph nodes within the right lower quadrant mesocolon adjacent to cecal mass, consistent with nodal metastases. 3. No evidence of distant metastatic disease in the chest, abdomen, or pelvis. 4. Sigmoid diverticulosis without evidence of acute diverticulitis. 5. Moderate hiatal hernia with intrathoracic position of the gastric fundus. 6. Cardiomegaly and coronary artery disease. Aortic Atherosclerosis (ICD10-I70.0). Electronically Signed   By: Jearld Lesch M.D.   On: 02/18/2023 17:11   DG Chest 2 View Result Date: 02/16/2023 CLINICAL DATA:  SOB EXAM: CHEST - 2 VIEW COMPARISON:  01/21/23. FINDINGS: Sternal wires. Post op changes. No consolidation, pneumothorax, effusion or edema. Calcified aorta. Bilateral apical pleural thickening. Degenerative changes along the spine. IMPRESSION: Post op chest.  Chronic changes. Electronically Signed   By: Karen Kays M.D.   On: 02/16/2023 15:04     Assessment and Plan:    Coronary artery bypass grafting x 4, 20 years ago Ischemic cardiomyopathy last EF 55-60 which was done in 2022 Hyperlipidemia History of CVA GERD   Her symptoms are not highly suspicious for angina.  She is currently being stratified for rectal mass surgery.  Echo is pending.  Once there is more information with her up-to-date echo we will be able to give the most up-to-date and appropriate recommendation for the patient in terms of her preoperative restratification and clearance.  In the meantime she is off of antiplatelet and anticoagulant in planning for the surgery agree with this will continue to monitor.  Lipid profile done yesterday LDL 102, will target for less than 55 for secondary prevention.  Noted allergy to atorvastatin which could not really tell me what that was.  But there are other lipid-lowering agents that we can consider in the outpatient setting  Prediabetic lifestyle modification advised.   Risk Assessment/Risk Scores:                For questions or updates, please contact [facility] Please consult [email or web address] for contact info under    Signed, Thomasene Ripple, DO  02/19/2023 9:59 AM

## 2023-02-19 NOTE — TOC Initial Note (Signed)
Transition of Care [facility]) - Initial/Assessment Note   Patient Details  Name: Debra Fry MRN: [id number] Date of Birth: Mar 02, 1940  Transition [facility]) CM/SW Contact:    Ewing Schlein, LCSW Phone Number: 02/19/2023, 2:44 PM  Clinical Narrative: Patient is from home with children. TOC following for possible discharge needs.  Expected Discharge Plan: Home/Self Care Barriers to Discharge: Continued Medical Work up  Expected Discharge Plan and Services In-house Referral: Clinical Social Work Discharge Planning Services: CM Consult Living arrangements for the past 2 months: Single Family Home  Prior Living Arrangements/Services Living arrangements for the past 2 months: Single Family Home Lives with:: Adult Children Patient language and need for interpreter reviewed:: Yes Do you feel safe going back to the place where you live?: Yes      Need for Family Participation in Patient Care: No (Comment) Care giver support system in place?: Yes (comment) Criminal Activity/Legal Involvement Pertinent to Current Situation/Hospitalization: No - Comment as needed  Activities of Daily Living ADL Screening (condition at time of admission) Independently performs ADLs?: Yes (appropriate for developmental age) Is the patient deaf or have difficulty hearing?: Yes Does the patient have difficulty seeing, even when wearing glasses/contacts?: No Does the patient have difficulty concentrating, remembering, or making decisions?: No  Emotional Assessment Orientation: : Oriented to Self, Oriented to Place, Oriented to  Time, Oriented to Situation Alcohol / Substance Use: Not Applicable Psych Involvement: No (comment)  Admission diagnosis:  Symptomatic anemia [D64.9] Patient Active Problem List   Diagnosis Date Noted   Leucocytosis 02/16/2023   B12 deficiency 01/22/2023   ABLA (acute blood loss anemia) 01/22/2023   Iron deficiency anemia 01/22/2023   Symptomatic anemia 01/21/2023   Urinary  tract infection, site not specified 09/21/2021   Vitamin D deficiency 09/11/2021   Occipital neuralgia 07/30/2021   Adjustment disorder with anxiety 07/08/2021   Osteoarthritis of knee 07/08/2021   Gout 07/08/2021   Elevated blood-pressure reading without diagnosis of hypertension 06/11/2021   Hypothyroidism 08/02/2019   Diabetes (HCC) 05/01/2019   Hyperglycemia 09/27/2018   Familial hyperlipidemia 03/27/2018   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) 02/13/2018   Acute CVA (cerebrovascular accident) (HCC) 01/27/2018   PCP:  Patient, No Pcp Per Pharmacy:   [id number] [place], [place] - [address] AT [facility] OF [address] [place] [address] Phone: [phone] Fax: [phone]  Social Drivers of Health (SDOH) Social History: SDOH Screenings   Food Insecurity: No Food Insecurity (02/17/2023)  Housing: Low Risk  (02/17/2023)  Transportation Needs: No Transportation Needs (02/17/2023)  Utilities: Not At Risk (02/17/2023)  Depression (PHQ2-9): Low Risk  (03/23/2018)  Social Connections: Moderately Integrated (02/17/2023)  Tobacco Use: Low Risk  (02/18/2023)   SDOH Interventions:    Readmission Risk Interventions     No data to display

## 2023-02-20 DIAGNOSIS — D649 Anemia, unspecified: Secondary | ICD-10-CM | POA: Diagnosis not present

## 2023-02-20 NOTE — Progress Notes (Signed)
2 Days Post-Op   Subjective/Chief Complaint: No complaints    Objective: Vital signs in last 24 hours: Temp:  [98.1 F (36.7 C)-98.3 F (36.8 C)] 98.3 F (36.8 C) (01/26 0445) Pulse Rate:  [62-80] 62 (01/26 0445) Resp:  [14-20] 15 (01/26 0445) BP: (126-140)/(42-51) 130/49 (01/26 0445) SpO2:  [96 %-98 %] 96 % (01/26 0445) Last BM Date : 02/18/23  Intake/Output from previous day: 01/25 0701 - 01/26 0700 In: 600 [P.O.:600] Out: 0  Intake/Output this shift: Total I/O In: 240 [P.O.:240] Out: -   GI: soft, non-tender; bowel sounds normal; no masses,  no organomegaly  Lab Results:  Recent Labs    02/18/23 0454 02/19/23 0723  WBC 8.7 7.7  HGB 8.3* 9.1*  HCT 26.9* 29.8*  PLT 265 306   BMET Recent Labs    02/18/23 0454 02/19/23 0723  NA 133* 136  K 3.7 3.9  CL 101 100  CO2 21* 25  GLUCOSE 87 93  BUN 16 13  CREATININE 0.64 0.77  CALCIUM 8.8* 9.0   PT/INR No results for input(s): "LABPROT", "INR" in the last 72 hours. ABG No results for input(s): "PHART", "HCO3" in the last 72 hours.  Invalid input(s): "PCO2", "PO2"  Studies/Results: ECHOCARDIOGRAM COMPLETE Result Date: 02/19/2023    ECHOCARDIOGRAM REPORT   Patient Name:   Debra Fry [place] Date of Exam: 02/19/2023 Medical Rec #:  [id number]        Height:       62.0 in Accession #:    [id number]       Weight:       120.0 lb Date of Birth:  08-31-1940       BSA:          1.539 m Patient Age:    83 years         BP:           140/41 mmHg Patient Gender: F                HR:           73 bpm. Exam Location:  Inpatient Procedure: 2D Echo, Cardiac Doppler, Color Doppler and Intracardiac            Opacification Agent Indications:    R94.31 Abnormal EKG  History:        Patient has no prior history of Echocardiogram examinations.                 Stroke; Risk Factors:Diabetes.  Sonographer:    Webb Laws Referring Phys: [id number] ELIZABETH G MATHEWS IMPRESSIONS  1. Left ventricular ejection fraction, by estimation, is 60  to 65%. The left ventricle has normal function. The left ventricle has no regional wall motion abnormalities. There is mild left ventricular hypertrophy of the basal-septal segment. Left ventricular diastolic parameters are consistent with Grade I diastolic dysfunction (impaired relaxation). Elevated left atrial pressure.  2. Right ventricular systolic function is normal. The right ventricular size is normal.  3. The mitral valve is normal in structure. Trivial mitral valve regurgitation. No evidence of mitral stenosis.  4. The aortic valve is tricuspid. Aortic valve regurgitation is not visualized. No aortic stenosis is present.  5. The inferior vena cava is normal in size with greater than 50% respiratory variability, suggesting right atrial pressure of 3 mmHg. Comparison(s): No prior Echocardiogram. FINDINGS  Left Ventricle: Left ventricular ejection fraction, by estimation, is 60 to 65%. The left ventricle has normal function. The left ventricle has no regional wall  motion abnormalities. Definity contrast agent was given IV to delineate the left ventricular  endocardial borders. The left ventricular internal cavity size was normal in size. There is mild left ventricular hypertrophy of the basal-septal segment. Left ventricular diastolic parameters are consistent with Grade I diastolic dysfunction (impaired relaxation). Elevated left atrial pressure. Right Ventricle: The right ventricular size is normal. Right ventricular systolic function is normal. Left Atrium: Left atrial size was normal in size. Right Atrium: Right atrial size was normal in size. Pericardium: There is no evidence of pericardial effusion. Mitral Valve: The mitral valve is normal in structure. Trivial mitral valve regurgitation. No evidence of mitral valve stenosis. Tricuspid Valve: The tricuspid valve is normal in structure. Tricuspid valve regurgitation is trivial. No evidence of tricuspid stenosis. Aortic Valve: The aortic valve is  tricuspid. Aortic valve regurgitation is not visualized. No aortic stenosis is present. Pulmonic Valve: The pulmonic valve was normal in structure. Pulmonic valve regurgitation is trivial. No evidence of pulmonic stenosis. Aorta: The aortic root is normal in size and structure. Venous: The inferior vena cava is normal in size with greater than 50% respiratory variability, suggesting right atrial pressure of 3 mmHg. IAS/Shunts: No atrial level shunt detected by color flow Doppler.  LEFT VENTRICLE PLAX 2D LVIDd:         4.10 cm     Diastology LVIDs:         3.00 cm     LV e' medial:    4.13 cm/s LV PW:         1.10 cm     LV E/e' medial:  21.4 LV IVS:        1.40 cm     LV e' lateral:   8.05 cm/s LVOT diam:     1.80 cm     LV E/e' lateral: 11.0 LV SV:         59 LV SV Index:   39 LVOT Area:     2.54 cm  LV Volumes (MOD) LV vol d, MOD A2C: 65.0 ml LV vol d, MOD A4C: 91.1 ml LV vol s, MOD A2C: 32.9 ml LV vol s, MOD A4C: 30.6 ml LV SV MOD A2C:     32.1 ml LV SV MOD A4C:     91.1 ml LV SV MOD BP:      45.1 ml RIGHT VENTRICLE             IVC RV Basal diam:  3.40 cm     IVC diam: 1.40 cm RV S prime:     10.40 cm/s TAPSE (M-mode): 1.6 cm LEFT ATRIUM             Index        RIGHT ATRIUM           Index LA diam:        3.70 cm 2.40 cm/m   RA Area:     10.20 cm LA Vol (A2C):   33.3 ml 21.64 ml/m  RA Volume:   20.30 ml  13.19 ml/m LA Vol (A4C):   40.5 ml 26.32 ml/m LA Biplane Vol: 40.4 ml 26.26 ml/m  AORTIC VALVE             PULMONIC VALVE LVOT Vmax:   100.00 cm/s PR End Diast Vel: 1.13 msec LVOT Vmean:  69.300 cm/s LVOT VTI:    0.233 m  AORTA Ao Root diam: 2.70 cm Ao Asc diam:  3.30 cm MITRAL VALVE  TRICUSPID VALVE MV Area (PHT): 3.72 cm     TR Peak grad:   13.2 mmHg MV Decel Time: 204 msec     TR Vmax:        182.00 cm/s MV E velocity: 88.30 cm/s MV A velocity: 107.00 cm/s  SHUNTS MV E/A ratio:  0.83         Systemic VTI:  0.23 m                             Systemic Diam: 1.80 cm Olga Millers MD  Electronically signed by Olga Millers MD Signature Date/Time: 02/19/2023/1:14:49 PM    Final    CT CHEST ABDOMEN PELVIS W CONTRAST Result Date: 02/18/2023 CLINICAL DATA:  New diagnosis colon cancer, staging * Tracking Code: BO * EXAM: CT CHEST, ABDOMEN, AND PELVIS WITH CONTRAST TECHNIQUE: Multidetector CT imaging of the chest, abdomen and pelvis was performed following the standard protocol during bolus administration of intravenous contrast. RADIATION DOSE REDUCTION: This exam was performed according to the departmental dose-optimization program which includes automated exposure control, adjustment of the mA and/or kV according to patient size and/or use of iterative reconstruction technique. CONTRAST:  OMNIPAQUE IOHEXOL 300 MG/ML SOLN additional oral enteric contrast COMPARISON:  None Available. FINDINGS: CT CHEST FINDINGS Cardiovascular: Aortic atherosclerosis. Cardiomegaly. Three-vessel coronary artery calcifications status post median sternotomy and CABG. No pericardial effusion. Mediastinum/Nodes: No enlarged mediastinal, hilar, or axillary lymph nodes. Moderate hiatal hernia with intrathoracic position of the gastric fundus. Thyroid gland, trachea, and esophagus demonstrate no significant findings. Lungs/Pleura: Lungs are clear. No pleural effusion or pneumothorax. Musculoskeletal: No chest wall abnormality. No acute osseous findings. CT ABDOMEN PELVIS FINDINGS Hepatobiliary: No solid liver abnormality is seen. No gallstones, gallbladder wall thickening, or biliary dilatation. Pancreas: Unremarkable. No pancreatic ductal dilatation or surrounding inflammatory changes. Spleen: Normal in size without significant abnormality. Adrenals/Urinary Tract: Adrenal glands are unremarkable. Kidneys are normal, without renal calculi, solid lesion, or hydronephrosis. Bladder is unremarkable. Stomach/Bowel: Stomach is within normal limits. Sessile mass of the cecum just superior to the ileocecal valve,  approximately 4.8 x 3.5 cm (series 6, image 40, series 2, image 84). Sigmoid diverticulosis. Vascular/Lymphatic: Severe aortic atherosclerosis. Enlarged lymph nodes within the right lower quadrant mesocolon adjacent to cecal mass, measuring up to 0.9 x 0.6 cm (series 2, image 80). Reproductive: No mass or other abnormality. Other: No abdominal wall hernia or abnormality. No ascites. Musculoskeletal: No acute osseous findings. IMPRESSION: 1. Sessile mass of the cecum just superior to the ileocecal valve, consistent with primary colon malignancy. 2. Enlarged lymph nodes within the right lower quadrant mesocolon adjacent to cecal mass, consistent with nodal metastases. 3. No evidence of distant metastatic disease in the chest, abdomen, or pelvis. 4. Sigmoid diverticulosis without evidence of acute diverticulitis. 5. Moderate hiatal hernia with intrathoracic position of the gastric fundus. 6. Cardiomegaly and coronary artery disease. Aortic Atherosclerosis (ICD10-I70.0). Electronically Signed   By: Jearld Lesch M.D.   On: 02/18/2023 17:11    Anti-infectives: Anti-infectives (From admission, onward)    None       Assessment/Plan: Right colon mass  CT concerning for cancer  LN involvement but not distant mest  Plan colectomy this week per Dr Fredricka Bonine Team to follow up in am to discuss    LOS: 4 days    Maisie Fus A Tiernan Millikin 02/20/2023

## 2023-02-20 NOTE — Progress Notes (Signed)
Progress Note  Patient Name: Debra Fry Date of Encounter: 02/20/2023  Primary Cardiologist: None   Subjective  Patient seen examined at her bedside.  States she was sitting up.  She has just had her breakfast.  No complaints at this time.  Inpatient Medications    Scheduled Meds:  vitamin D3  2,000 Units Oral Daily   cyanocobalamin  1,000 mcg Oral Daily   pantoprazole (PROTONIX) IV  40 mg Intravenous Q12H   Vitamin D (Ergocalciferol)  50,000 Units Oral Q7 days   Continuous Infusions:  PRN Meds: carbamazepine, gabapentin, morphine injection, ondansetron **OR** ondansetron (ZOFRAN) IV   Vital Signs    Vitals:   02/19/23 1252 02/19/23 1400 02/19/23 2050 02/20/23 0445  BP: (!) 140/51  (!) 126/42 (!) 130/49  Pulse: 65  80 62  Resp: 20 20 14 15   Temp: 98.1 F (36.7 C)  98.2 F (36.8 C) 98.3 F (36.8 C)  TempSrc: Oral  Oral Oral  SpO2: 98%  98% 96%  Weight:      Height:        Intake/Output Summary (Last 24 hours) at 02/20/2023 0902 Last data filed at 02/19/2023 1600 Gross per 24 hour  Intake 480 ml  Output 0 ml  Net 480 ml   Filed Weights   02/16/23 1418  Weight: 54.4 kg    Telemetry    Sinus rhythm- Personally Reviewed  ECG    None today- Personally Reviewed  Physical Exam    General: Comfortable, sitting up in a chair Head: Atraumatic, normal size  Eyes: PEERLA, EOMI  Neck: Supple, normal JVD Cardiac: Normal S1, S2; RRR; no murmurs, rubs, or gallops Lungs: Clear to auscultation bilaterally Abd: Soft, nontender, no hepatomegaly  Ext: warm, no edema Musculoskeletal: No deformities, BUE and BLE strength normal and equal Skin: Warm and dry, no rashes   Neuro: Alert and oriented to person, place, time, and situation, CNII-XII grossly intact, no focal deficits  Psych: Normal mood and affect   Labs    Chemistry Recent Labs  Lab 02/17/23 0525 02/18/23 0454 02/19/23 0723  NA 136 133* 136  K 3.8 3.7 3.9  CL 104 101 100  CO2 25 21* 25   GLUCOSE 102* 87 93  BUN 15 16 13   CREATININE 0.39* 0.64 0.77  CALCIUM 9.1 8.8* 9.0  PROT 7.3 6.9 7.5  ALBUMIN 3.2* 3.0* 3.2*  AST 17 17 19   ALT 12 11 14   ALKPHOS 57 57 63  BILITOT 1.1 1.5* 1.4*  GFRNONAA >60 >60 >60  ANIONGAP 7 11 11      Hematology Recent Labs  Lab 02/18/23 0045 02/18/23 0454 02/19/23 0723  WBC 9.2 8.7 7.7  RBC 3.72* 3.67* 4.05  HGB 8.5* 8.3* 9.1*  HCT 27.9* 26.9* 29.8*  MCV 75.0* 73.3* 73.6*  MCH 22.8* 22.6* 22.5*  MCHC 30.5 30.9 30.5  RDW 23.7* 23.8* 24.1*  PLT 272 265 306    Cardiac EnzymesNo results for input(s): "TROPONINI" in the last 168 hours. No results for input(s): "TROPIPOC" in the last 168 hours.   BNPNo results for input(s): "BNP", "PROBNP" in the last 168 hours.   DDimer No results for input(s): "DDIMER" in the last 168 hours.   Radiology    ECHOCARDIOGRAM COMPLETE Result Date: 02/19/2023    ECHOCARDIOGRAM REPORT   Patient Name:   Debra Fry Date of Exam: 02/19/2023 Medical Rec #:  [id number]        Height:       62.0 in Accession #:  [id number]       Weight:       120.0 lb Date of Birth:  [birth year]       BSA:          1.539 m Patient Age:    83 years         BP:           140/41 mmHg Patient Gender: F                HR:           73 bpm. Exam Location:  Inpatient Procedure: 2D Echo, Cardiac Doppler, Color Doppler and Intracardiac            Opacification Agent Indications:    R94.31 Abnormal EKG  History:        Patient has no prior history of Echocardiogram examinations.                 Stroke; Risk Factors:Diabetes.  Sonographer:    Webb Laws Referring Phys: [id number] ELIZABETH G MATHEWS IMPRESSIONS  1. Left ventricular ejection fraction, by estimation, is 60 to 65%. The left ventricle has normal function. The left ventricle has no regional wall motion abnormalities. There is mild left ventricular hypertrophy of the basal-septal segment. Left ventricular diastolic parameters are consistent with Grade I diastolic dysfunction  (impaired relaxation). Elevated left atrial pressure.  2. Right ventricular systolic function is normal. The right ventricular size is normal.  3. The mitral valve is normal in structure. Trivial mitral valve regurgitation. No evidence of mitral stenosis.  4. The aortic valve is tricuspid. Aortic valve regurgitation is not visualized. No aortic stenosis is present.  5. The inferior vena cava is normal in size with greater than 50% respiratory variability, suggesting right atrial pressure of 3 mmHg. Comparison(s): No prior Echocardiogram. FINDINGS  Left Ventricle: Left ventricular ejection fraction, by estimation, is 60 to 65%. The left ventricle has normal function. The left ventricle has no regional wall motion abnormalities. Definity contrast agent was given IV to delineate the left ventricular  endocardial borders. The left ventricular internal cavity size was normal in size. There is mild left ventricular hypertrophy of the basal-septal segment. Left ventricular diastolic parameters are consistent with Grade I diastolic dysfunction (impaired relaxation). Elevated left atrial pressure. Right Ventricle: The right ventricular size is normal. Right ventricular systolic function is normal. Left Atrium: Left atrial size was normal in size. Right Atrium: Right atrial size was normal in size. Pericardium: There is no evidence of pericardial effusion. Mitral Valve: The mitral valve is normal in structure. Trivial mitral valve regurgitation. No evidence of mitral valve stenosis. Tricuspid Valve: The tricuspid valve is normal in structure. Tricuspid valve regurgitation is trivial. No evidence of tricuspid stenosis. Aortic Valve: The aortic valve is tricuspid. Aortic valve regurgitation is not visualized. No aortic stenosis is present. Pulmonic Valve: The pulmonic valve was normal in structure. Pulmonic valve regurgitation is trivial. No evidence of pulmonic stenosis. Aorta: The aortic root is normal in size and structure.  Venous: The inferior vena cava is normal in size with greater than 50% respiratory variability, suggesting right atrial pressure of 3 mmHg. IAS/Shunts: No atrial level shunt detected by color flow Doppler.  LEFT VENTRICLE PLAX 2D LVIDd:         4.10 cm     Diastology LVIDs:         3.00 cm     LV e' medial:    4.13 cm/s LV PW:  1.10 cm     LV E/e' medial:  21.4 LV IVS:        1.40 cm     LV e' lateral:   8.05 cm/s LVOT diam:     1.80 cm     LV E/e' lateral: 11.0 LV SV:         59 LV SV Index:   39 LVOT Area:     2.54 cm  LV Volumes (MOD) LV vol d, MOD A2C: 65.0 ml LV vol d, MOD A4C: 91.1 ml LV vol s, MOD A2C: 32.9 ml LV vol s, MOD A4C: 30.6 ml LV SV MOD A2C:     32.1 ml LV SV MOD A4C:     91.1 ml LV SV MOD BP:      45.1 ml RIGHT VENTRICLE             IVC RV Basal diam:  3.40 cm     IVC diam: 1.40 cm RV S prime:     10.40 cm/s TAPSE (M-mode): 1.6 cm LEFT ATRIUM             Index        RIGHT ATRIUM           Index LA diam:        3.70 cm 2.40 cm/m   RA Area:     10.20 cm LA Vol (A2C):   33.3 ml 21.64 ml/m  RA Volume:   20.30 ml  13.19 ml/m LA Vol (A4C):   40.5 ml 26.32 ml/m LA Biplane Vol: 40.4 ml 26.26 ml/m  AORTIC VALVE             PULMONIC VALVE LVOT Vmax:   100.00 cm/s PR End Diast Vel: 1.13 msec LVOT Vmean:  69.300 cm/s LVOT VTI:    0.233 m  AORTA Ao Root diam: 2.70 cm Ao Asc diam:  3.30 cm MITRAL VALVE                TRICUSPID VALVE MV Area (PHT): 3.72 cm     TR Peak grad:   13.2 mmHg MV Decel Time: 204 msec     TR Vmax:        182.00 cm/s MV E velocity: 88.30 cm/s MV A velocity: 107.00 cm/s  SHUNTS MV E/A ratio:  0.83         Systemic VTI:  0.23 m                             Systemic Diam: 1.80 cm Olga Millers MD Electronically signed by Olga Millers MD Signature Date/Time: 02/19/2023/1:14:49 PM    Final    CT CHEST ABDOMEN PELVIS W CONTRAST Result Date: 02/18/2023 CLINICAL DATA:  New diagnosis colon cancer, staging * Tracking Code: BO * EXAM: CT CHEST, ABDOMEN, AND PELVIS WITH CONTRAST  TECHNIQUE: Multidetector CT imaging of the chest, abdomen and pelvis was performed following the standard protocol during bolus administration of intravenous contrast. RADIATION DOSE REDUCTION: This exam was performed according to the departmental dose-optimization program which includes automated exposure control, adjustment of the mA and/or kV according to patient size and/or use of iterative reconstruction technique. CONTRAST:  OMNIPAQUE IOHEXOL 300 MG/ML SOLN additional oral enteric contrast COMPARISON:  None Available. FINDINGS: CT CHEST FINDINGS Cardiovascular: Aortic atherosclerosis. Cardiomegaly. Three-vessel coronary artery calcifications status post median sternotomy and CABG. No pericardial effusion. Mediastinum/Nodes: No enlarged mediastinal, hilar, or axillary lymph nodes. Moderate hiatal hernia with  intrathoracic position of the gastric fundus. Thyroid gland, trachea, and esophagus demonstrate no significant findings. Lungs/Pleura: Lungs are clear. No pleural effusion or pneumothorax. Musculoskeletal: No chest wall abnormality. No acute osseous findings. CT ABDOMEN PELVIS FINDINGS Hepatobiliary: No solid liver abnormality is seen. No gallstones, gallbladder wall thickening, or biliary dilatation. Pancreas: Unremarkable. No pancreatic ductal dilatation or surrounding inflammatory changes. Spleen: Normal in size without significant abnormality. Adrenals/Urinary Tract: Adrenal glands are unremarkable. Kidneys are normal, without renal calculi, solid lesion, or hydronephrosis. Bladder is unremarkable. Stomach/Bowel: Stomach is within normal limits. Sessile mass of the cecum just superior to the ileocecal valve, approximately 4.8 x 3.5 cm (series 6, image 40, series 2, image 84). Sigmoid diverticulosis. Vascular/Lymphatic: Severe aortic atherosclerosis. Enlarged lymph nodes within the right lower quadrant mesocolon adjacent to cecal mass, measuring up to 0.9 x 0.6 cm (series 2, image 80).  Reproductive: No mass or other abnormality. Other: No abdominal wall hernia or abnormality. No ascites. Musculoskeletal: No acute osseous findings. IMPRESSION: 1. Sessile mass of the cecum just superior to the ileocecal valve, consistent with primary colon malignancy. 2. Enlarged lymph nodes within the right lower quadrant mesocolon adjacent to cecal mass, consistent with nodal metastases. 3. No evidence of distant metastatic disease in the chest, abdomen, or pelvis. 4. Sigmoid diverticulosis without evidence of acute diverticulitis. 5. Moderate hiatal hernia with intrathoracic position of the gastric fundus. 6. Cardiomegaly and coronary artery disease. Aortic Atherosclerosis (ICD10-I70.0). Electronically Signed   By: Jearld Lesch M.D.   On: 02/18/2023 17:11    Cardiac Studies   Reviewed echo  Patient Profile     83 y.o. female with coronary artery disease status post CABG x 4,20 years ago was admitted acute blood loss anemia.  Assessment & Plan    Coronary artery bypass grafting x 4, 20 years ago Ischemic cardiomyopathy last EF 55-60 which was done in 2022 Hyperlipidemia History of CVA GERD   Thankfully her echo was normal with no wall motion abnormalities and a normal ejection fraction.  The patient does not have any unstable cardiac conditions.  Upon evaluation today, she can achieve 4 METs or greater without anginal symptoms.  According to [facility] and AHA guidelines, she equires no further cardiac workup prior to her oncardiac surgery and should be at acceptable risk.  Our service is available as necessary in the perioperative period.  In the meantime she is of antiplatelet in the setting of her planned surgery and recent GI bleeding.  To restart this given history of CAD will need clearance from GI and surgery once she is medically stable.  Lipid profile done yesterday LDL 102, will target for less than 55 for secondary prevention.  Noted allergy to atorvastatin which could not really tell  me what that was.  But there are other lipid-lowering agents that we can consider in the outpatient setting   Prediabetic lifestyle modification advised      For questions or updates, please contact [facility] Please consult [email or web address] for contact info under Cardiology/STEMI.      Signed, Namya Voges, DO  02/20/2023, 9:02 AM

## 2023-02-20 NOTE — Plan of Care (Addendum)
Patient AO X 4, VSS ambulatory in room. No complaints of pain, wanting to sleep some today after a poor night of rest. Remains on clear liquid diet pending possible GI surgery plan, NPO at midnight?  Problem: Education: Goal: Knowledge of General Education information will improve Description: Including pain rating scale, medication(s)/side effects and non-pharmacologic comfort measures Outcome: Progressing   Problem: Health Behavior/Discharge Planning: Goal: Ability to manage health-related needs will improve Outcome: Progressing   Problem: Clinical Measurements: Goal: Ability to maintain clinical measurements within normal limits will improve Outcome: Progressing Goal: Will remain free from infection Outcome: Progressing Goal: Diagnostic test results will improve Outcome: Progressing Goal: Respiratory complications will improve Outcome: Progressing Goal: Cardiovascular complication will be avoided Outcome: Progressing   Problem: Activity: Goal: Risk for activity intolerance will decrease Outcome: Progressing   Problem: Nutrition: Goal: Adequate nutrition will be maintained Outcome: Progressing   Problem: Coping: Goal: Level of anxiety will decrease Outcome: Progressing   Problem: Elimination: Goal: Will not experience complications related to bowel motility Outcome: Progressing Goal: Will not experience complications related to urinary retention Outcome: Progressing   Problem: Pain Managment: Goal: General experience of comfort will improve and/or be controlled Outcome: Progressing   Problem: Safety: Goal: Ability to remain free from injury will improve Outcome: Progressing   Problem: Skin Integrity: Goal: Risk for impaired skin integrity will decrease Outcome: Progressing

## 2023-02-20 NOTE — Progress Notes (Signed)
PROGRESS NOTE    Debra Fry  WJX:[id number] DOB: 02/09/1940 DOA: 02/16/2023 PCP: Patient, No Pcp Per   Brief Narrative: 83 year old female with history of coronary artery disease, MI, GERD hiatal hernia stroke, hyperlipidemia, peptic ulcer disease, admitted with generalized weakness back pain and right upper extremity pain.  Patient was admitted recently to the hospital with similar complaints and was found to have a hemoglobin of 5.4 she was transfused at that time with 2 units of packed RBCs started on Protonix twice a day, EGD at that time showed small clean-based gastric ulcer with no evidence of active bleeding biopsies were negative for H. pylori.  GI followed the patient at that time.  FOBT is positive this admission.  GI was consulted. Patient reported having dark stools at home after discharge from the hospital.. Assessment & Plan:   Principal Problem:   Symptomatic anemia Active Problems:   Familial hyperlipidemia   Diabetes (HCC)   Hypothyroidism   Adjustment disorder with anxiety   Gout   Leucocytosis   #1 IDA anemia panel from December 2024 with iron of 18 and TIBC elevated with low ferritin at 3 and saturation at 3. Last EGD in December 2024 showed 3 mm gastric ulcer and gastritis with negative biopsy for H. pylori.  Patient continues with anemia of unclear etiology.  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 CT chest abdomen and pelvis  no mets    #2 type 2 diabetes she does not take anything at home a1c 5.8 CBG (last 3)  No results for input(s): "GLUCAP" in the last 72 hours.   #3 hypothyroidism her last TSH end of next December 2024 was within normal limits apparently she had stopped taking Synthroid  #4 history of ischemic cardiomyopathy not on diuretics prior to admission Last echo January 2020 EF 55 to  60% Echo 02/19/2023 shows EF of 60 to 65%, no regional wall motion abnormalities, mild LVH grade 1 diastolic dysfunction. Seen by cardiology does not recommend any further workup prior to surgery.  #5 history of stroke without any residual effects  #6 hyperlipidemia not on statins LDL 105.  Cardiology recommends less than 55 for secondary prevention.  #7 B12 deficiency B12 level was 155 in December 2024 on B12 supplements  #8 anxiety on Neurontin carbamazepine  #9 vitamin D deficiency current continue supplementation.  Estimated body mass index is 21.95 kg/m as calculated from the following:   Height as of this encounter: 5\' 2"  (1.575 m).   Weight as of this encounter: 54.4 kg.  DVT prophylaxis: None Code Status: Full Family Communication: None at bedside  disposition Plan:  Status is: Inpatient Remains inpatient appropriate because: Acute illness GI bleed   Consultants:  gi Cardiology Surgery  Procedures: none  Antimicrobials:  Subjective:  Slept well still on clear liquids no new complaints Objective: Vitals:   02/19/23 1252 02/19/23 1400 02/19/23 2050 02/20/23 0445  BP: (!) 140/51  (!) 126/42 (!) 130/49  Pulse: 65  80 62  Resp: 20 20 14 15   Temp: 98.1 F (36.7 C)  98.2 F (36.8 C) 98.3 F (36.8 C)  TempSrc: Oral  Oral Oral  SpO2: 98%  98% 96%  Weight:      Height:        Intake/Output Summary (Last 24 hours) at 02/20/2023 1232 Last data filed at 02/20/2023 0900 Gross per 24 hour  Intake 480 ml  Output 0 ml  Net 480 ml   Filed Weights   02/16/23 1418  Weight: 54.4 kg    Examination:  General exam: Appears in no acute distress  respiratory system: Clear to auscultation. Respiratory effort normal. Cardiovascular system: S1 & S2 heard, RRR. No JVD, murmurs, rubs, gallops or clicks. No pedal edema. Gastrointestinal system: Abdomen is nondistended, soft and nontender. No organomegaly or masses felt. Normal bowel sounds heard. Central nervous system:  Alert and oriented. No focal neurological deficits. Extremities:no edema  Data Reviewed: I have personally reviewed following labs and imaging studies  CBC: Recent Labs  Lab 02/17/23 0907 02/17/23 1514 02/18/23 0045 02/18/23 0454 02/19/23 0723  WBC 6.7 7.4 9.2 8.7 7.7  HGB 7.3* 7.7* 8.5* 8.3* 9.1*  HCT 24.5* 26.6* 27.9* 26.9* 29.8*  MCV 71.2* 71.3* 75.0* 73.3* 73.6*  PLT 284 305 272 265 306   Basic Metabolic Panel: Recent Labs  Lab 02/16/23 1537 02/17/23 0525 02/18/23 0454 02/19/23 0723  NA 135 136 133* 136  K 4.0 3.8 3.7 3.9  CL 102 104 101 100  CO2 24 25 21* 25  GLUCOSE 115* 102* 87 93  BUN 17 15 16 13   CREATININE 0.51 0.39* 0.64 0.77  CALCIUM 9.3 9.1 8.8* 9.0   GFR: Estimated Creatinine Clearance: 44.4 mL/min (by C-G formula based on SCr of 0.77 mg/dL). Liver Function Tests: Recent Labs  Lab 02/16/23 1537 02/17/23 0525 02/18/23 0454 02/19/23 0723  AST 18 17 17 19   ALT 10 12 11 14   ALKPHOS 67 57 57 63  BILITOT 0.8 1.1 1.5* 1.4*  PROT 7.9 7.3 6.9 7.5  ALBUMIN 3.6 3.2* 3.0* 3.2*   No results for input(s): "LIPASE", "AMYLASE" in the last 168 hours. No results for input(s): "AMMONIA" in the last 168 hours. Coagulation Profile: No results for input(s): "INR", "PROTIME" in the last 168 hours. Cardiac Enzymes: No results for input(s): "CKTOTAL", "CKMB", "CKMBINDEX", "TROPONINI" in the last 168 hours. BNP (last 3 results) No results for input(s): "PROBNP" in the last 8760 hours. HbA1C: Recent Labs    02/18/23 0454  HGBA1C 5.8*   CBG: No results for input(s): "GLUCAP" in the last 168 hours. Lipid Profile: Recent Labs    02/18/23 0454  CHOL 156  HDL 41  LDLCALC 102*  TRIG 64  CHOLHDL 3.8   Thyroid Function Tests: No results for input(s): "TSH", "T4TOTAL", "FREET4", "T3FREE", "THYROIDAB" in the last 72 hours. Anemia Panel: No results for input(s): "VITAMINB12", "FOLATE", "FERRITIN", "TIBC", "IRON", "RETICCTPCT" in the last 72 hours. Sepsis  Labs: No results for input(s): "PROCALCITON", "LATICACIDVEN" in the last 168 hours.  No results found for this or any previous visit (from the past 240 hours).   Radiology Studies: ECHOCARDIOGRAM COMPLETE Result Date: 02/19/2023    ECHOCARDIOGRAM REPORT   Patient Name:   Debra Fry Date of Exam: 02/19/2023 Medical Rec #:  [id number]        Height:       62.0 in Accession #:    [id number]       Weight:       120.0 lb Date of Birth:  January 19, 1941       BSA:          1.539 m Patient Age:    82 years         BP:           140/41 mmHg Patient Gender: F  HR:           73 bpm. Exam Location:  Inpatient Procedure: 2D Echo, Cardiac Doppler, Color Doppler and Intracardiac            Opacification Agent Indications:    R94.31 Abnormal EKG  History:        Patient has no prior history of Echocardiogram examinations.                 Stroke; Risk Factors:Diabetes.  Sonographer:    Webb Laws Referring Phys: [id number] Lorance Pickeral G Marijean Montanye IMPRESSIONS  1. Left ventricular ejection fraction, by estimation, is 60 to 65%. The left ventricle has normal function. The left ventricle has no regional wall motion abnormalities. There is mild left ventricular hypertrophy of the basal-septal segment. Left ventricular diastolic parameters are consistent with Grade I diastolic dysfunction (impaired relaxation). Elevated left atrial pressure.  2. Right ventricular systolic function is normal. The right ventricular size is normal.  3. The mitral valve is normal in structure. Trivial mitral valve regurgitation. No evidence of mitral stenosis.  4. The aortic valve is tricuspid. Aortic valve regurgitation is not visualized. No aortic stenosis is present.  5. The inferior vena cava is normal in size with greater than 50% respiratory variability, suggesting right atrial pressure of 3 mmHg. Comparison(s): No prior Echocardiogram. FINDINGS  Left Ventricle: Left ventricular ejection fraction, by estimation, is 60 to 65%. The  left ventricle has normal function. The left ventricle has no regional wall motion abnormalities. Definity contrast agent was given IV to delineate the left ventricular  endocardial borders. The left ventricular internal cavity size was normal in size. There is mild left ventricular hypertrophy of the basal-septal segment. Left ventricular diastolic parameters are consistent with Grade I diastolic dysfunction (impaired relaxation). Elevated left atrial pressure. Right Ventricle: The right ventricular size is normal. Right ventricular systolic function is normal. Left Atrium: Left atrial size was normal in size. Right Atrium: Right atrial size was normal in size. Pericardium: There is no evidence of pericardial effusion. Mitral Valve: The mitral valve is normal in structure. Trivial mitral valve regurgitation. No evidence of mitral valve stenosis. Tricuspid Valve: The tricuspid valve is normal in structure. Tricuspid valve regurgitation is trivial. No evidence of tricuspid stenosis. Aortic Valve: The aortic valve is tricuspid. Aortic valve regurgitation is not visualized. No aortic stenosis is present. Pulmonic Valve: The pulmonic valve was normal in structure. Pulmonic valve regurgitation is trivial. No evidence of pulmonic stenosis. Aorta: The aortic root is normal in size and structure. Venous: The inferior vena cava is normal in size with greater than 50% respiratory variability, suggesting right atrial pressure of 3 mmHg. IAS/Shunts: No atrial level shunt detected by color flow Doppler.  LEFT VENTRICLE PLAX 2D LVIDd:         4.10 cm     Diastology LVIDs:         3.00 cm     LV e' medial:    4.13 cm/s LV PW:         1.10 cm     LV E/e' medial:  21.4 LV IVS:        1.40 cm     LV e' lateral:   8.05 cm/s LVOT diam:     1.80 cm     LV E/e' lateral: 11.0 LV SV:         59 LV SV Index:   39 LVOT Area:     2.54 cm  LV Volumes (MOD) LV vol d,  MOD A2C: 65.0 ml LV vol d, MOD A4C: 91.1 ml LV vol s, MOD A2C: 32.9 ml LV vol  s, MOD A4C: 30.6 ml LV SV MOD A2C:     32.1 ml LV SV MOD A4C:     91.1 ml LV SV MOD BP:      45.1 ml RIGHT VENTRICLE             IVC RV Basal diam:  3.40 cm     IVC diam: 1.40 cm RV S prime:     10.40 cm/s TAPSE (M-mode): 1.6 cm LEFT ATRIUM             Index        RIGHT ATRIUM           Index LA diam:        3.70 cm 2.40 cm/m   RA Area:     10.20 cm LA Vol (A2C):   33.3 ml 21.64 ml/m  RA Volume:   20.30 ml  13.19 ml/m LA Vol (A4C):   40.5 ml 26.32 ml/m LA Biplane Vol: 40.4 ml 26.26 ml/m  AORTIC VALVE             PULMONIC VALVE LVOT Vmax:   100.00 cm/s PR End Diast Vel: 1.13 msec LVOT Vmean:  69.300 cm/s LVOT VTI:    0.233 m  AORTA Ao Root diam: 2.70 cm Ao Asc diam:  3.30 cm MITRAL VALVE                TRICUSPID VALVE MV Area (PHT): 3.72 cm     TR Peak grad:   13.2 mmHg MV Decel Time: 204 msec     TR Vmax:        182.00 cm/s MV E velocity: 88.30 cm/s MV A velocity: 107.00 cm/s  SHUNTS MV E/A ratio:  0.83         Systemic VTI:  0.23 m                             Systemic Diam: 1.80 cm Olga Millers MD Electronically signed by Olga Millers MD Signature Date/Time: 02/19/2023/1:14:49 PM    Final    CT CHEST ABDOMEN PELVIS W CONTRAST Result Date: 02/18/2023 CLINICAL DATA:  New diagnosis colon cancer, staging * Tracking Code: BO * EXAM: CT CHEST, ABDOMEN, AND PELVIS WITH CONTRAST TECHNIQUE: Multidetector CT imaging of the chest, abdomen and pelvis was performed following the standard protocol during bolus administration of intravenous contrast. RADIATION DOSE REDUCTION: This exam was performed according to the departmental dose-optimization program which includes automated exposure control, adjustment of the mA and/or kV according to patient size and/or use of iterative reconstruction technique. CONTRAST:  OMNIPAQUE IOHEXOL 300 MG/ML SOLN additional oral enteric contrast COMPARISON:  None Available. FINDINGS: CT CHEST FINDINGS Cardiovascular: Aortic atherosclerosis. Cardiomegaly. Three-vessel coronary  artery calcifications status post median sternotomy and CABG. No pericardial effusion. Mediastinum/Nodes: No enlarged mediastinal, hilar, or axillary lymph nodes. Moderate hiatal hernia with intrathoracic position of the gastric fundus. Thyroid gland, trachea, and esophagus demonstrate no significant findings. Lungs/Pleura: Lungs are clear. No pleural effusion or pneumothorax. Musculoskeletal: No chest wall abnormality. No acute osseous findings. CT ABDOMEN PELVIS FINDINGS Hepatobiliary: No solid liver abnormality is seen. No gallstones, gallbladder wall thickening, or biliary dilatation. Pancreas: Unremarkable. No pancreatic ductal dilatation or surrounding inflammatory changes. Spleen: Normal in size without significant abnormality. Adrenals/Urinary Tract: Adrenal glands are unremarkable. Kidneys are normal, without renal  calculi, solid lesion, or hydronephrosis. Bladder is unremarkable. Stomach/Bowel: Stomach is within normal limits. Sessile mass of the cecum just superior to the ileocecal valve, approximately 4.8 x 3.5 cm (series 6, image 40, series 2, image 84). Sigmoid diverticulosis. Vascular/Lymphatic: Severe aortic atherosclerosis. Enlarged lymph nodes within the right lower quadrant mesocolon adjacent to cecal mass, measuring up to 0.9 x 0.6 cm (series 2, image 80). Reproductive: No mass or other abnormality. Other: No abdominal wall hernia or abnormality. No ascites. Musculoskeletal: No acute osseous findings. IMPRESSION: 1. Sessile mass of the cecum just superior to the ileocecal valve, consistent with primary colon malignancy. 2. Enlarged lymph nodes within the right lower quadrant mesocolon adjacent to cecal mass, consistent with nodal metastases. 3. No evidence of distant metastatic disease in the chest, abdomen, or pelvis. 4. Sigmoid diverticulosis without evidence of acute diverticulitis. 5. Moderate hiatal hernia with intrathoracic position of the gastric fundus. 6. Cardiomegaly and coronary artery  disease. Aortic Atherosclerosis (ICD10-I70.0). Electronically Signed   By: Jearld Lesch M.D.   On: 02/18/2023 17:11    Scheduled Meds:  vitamin D3  2,000 Units Oral Daily   cyanocobalamin  1,000 mcg Oral Daily   pantoprazole (PROTONIX) IV  40 mg Intravenous Q12H   Vitamin D (Ergocalciferol)  50,000 Units Oral Q7 days   Continuous Infusions:     LOS: 4 days    Time spent: 38 min Alwyn Ren, MD  02/20/2023, 12:32 PM

## 2023-02-20 NOTE — Progress Notes (Signed)
Patient AO X 4, VSS ambulatory in room. No complaints of pain, wanting to sleep some today after a poor night of rest. Remains on clear liquid diet pending possible GI surgery plan, NPO at midnight?

## 2023-02-21 ENCOUNTER — Encounter (HOSPITAL_COMMUNITY): Payer: Self-pay | Admitting: Gastroenterology

## 2023-02-21 DIAGNOSIS — D649 Anemia, unspecified: Secondary | ICD-10-CM | POA: Diagnosis not present

## 2023-02-21 LAB — SURGICAL PATHOLOGY

## 2023-02-21 MED ORDER — HEPARIN SODIUM (PORCINE) 5000 UNIT/ML IJ SOLN
5000.0000 [IU] | INTRAMUSCULAR | Status: AC
  Administered 2023-02-22: 5000 [IU] via SUBCUTANEOUS
  Filled 2023-02-21: qty 1

## 2023-02-21 MED ORDER — ENSURE PRE-SURGERY PO LIQD
592.0000 mL | Freq: Once | ORAL | Status: AC
  Administered 2023-02-21: 592 mL via ORAL
  Filled 2023-02-21: qty 592

## 2023-02-21 MED ORDER — BUPIVACAINE LIPOSOME 1.3 % IJ SUSP
20.0000 mL | Freq: Once | INTRAMUSCULAR | Status: DC
  Filled 2023-02-21: qty 20

## 2023-02-21 MED ORDER — NEOMYCIN SULFATE 500 MG PO TABS
1000.0000 mg | ORAL_TABLET | ORAL | Status: AC
  Administered 2023-02-21 (×3): 1000 mg via ORAL
  Filled 2023-02-21 (×3): qty 2

## 2023-02-21 MED ORDER — CHLORHEXIDINE GLUCONATE CLOTH 2 % EX PADS
6.0000 | MEDICATED_PAD | Freq: Once | CUTANEOUS | Status: AC
  Administered 2023-02-21: 6 via TOPICAL

## 2023-02-21 MED ORDER — ALVIMOPAN 12 MG PO CAPS
12.0000 mg | ORAL_CAPSULE | ORAL | Status: AC
  Administered 2023-02-22: 12 mg via ORAL
  Filled 2023-02-21: qty 1

## 2023-02-21 MED ORDER — SODIUM CHLORIDE 0.9 % IV SOLN
2.0000 g | INTRAVENOUS | Status: AC
  Administered 2023-02-22: 2 g via INTRAVENOUS
  Filled 2023-02-21 (×2): qty 2

## 2023-02-21 MED ORDER — CHLORHEXIDINE GLUCONATE CLOTH 2 % EX PADS
6.0000 | MEDICATED_PAD | Freq: Once | CUTANEOUS | Status: AC
  Administered 2023-02-22: 6 via TOPICAL

## 2023-02-21 MED ORDER — ENSURE PRE-SURGERY PO LIQD
296.0000 mL | Freq: Once | ORAL | Status: AC
  Administered 2023-02-22: 296 mL via ORAL
  Filled 2023-02-21 (×2): qty 296

## 2023-02-21 MED ORDER — METRONIDAZOLE 500 MG PO TABS
1000.0000 mg | ORAL_TABLET | ORAL | Status: AC
  Administered 2023-02-21 (×3): 1000 mg via ORAL
  Filled 2023-02-21 (×3): qty 2

## 2023-02-21 MED ORDER — ACETAMINOPHEN 500 MG PO TABS
1000.0000 mg | ORAL_TABLET | ORAL | Status: AC
  Administered 2023-02-22: 1000 mg via ORAL
  Filled 2023-02-21: qty 2

## 2023-02-21 NOTE — Plan of Care (Signed)

## 2023-02-21 NOTE — H&P (View-Only) (Signed)
3 Days Post-Op   Subjective/Chief Complaint: No complaints, has not noted any blood in stool    Objective: Vital signs in last 24 hours: Temp:  [97.6 F (36.4 C)-98.2 F (36.8 C)] 98.1 F (36.7 C) (01/27 0518) Pulse Rate:  [69-78] 77 (01/27 0518) Resp:  [16-20] 16 (01/27 0518) BP: (137-151)/(54-63) 151/63 (01/27 0518) SpO2:  [99 %-100 %] 100 % (01/27 0518) Last BM Date : 02/19/23  Intake/Output from previous day: 01/26 0701 - 01/27 0700 In: 560 [P.O.:560] Out: -  Intake/Output this shift: No intake/output data recorded.  GI: soft, non-tender; bowel sounds normal; no masses,  no organomegaly  Lab Results:  Recent Labs    02/19/23 0723  WBC 7.7  HGB 9.1*  HCT 29.8*  PLT 306   BMET Recent Labs    02/19/23 0723  NA 136  K 3.9  CL 100  CO2 25  GLUCOSE 93  BUN 13  CREATININE 0.77  CALCIUM 9.0   PT/INR No results for input(s): "LABPROT", "INR" in the last 72 hours. ABG No results for input(s): "PHART", "HCO3" in the last 72 hours.  Invalid input(s): "PCO2", "PO2"  Studies/Results: ECHOCARDIOGRAM COMPLETE Result Date: 02/19/2023    ECHOCARDIOGRAM REPORT   Patient Name:   Debra Fry Date of Exam: 02/19/2023 Medical Rec #:  [id number]        Height:       62.0 in Accession #:    [id number]       Weight:       120.0 lb Date of Birth:  08/23/1940       BSA:          1.539 m Patient Age:    82 years         BP:           140/41 mmHg Patient Gender: F                HR:           73 bpm. Exam Location:  Inpatient Procedure: 2D Echo, Cardiac Doppler, Color Doppler and Intracardiac            Opacification Agent Indications:    R94.31 Abnormal EKG  History:        Patient has no prior history of Echocardiogram examinations.                 Stroke; Risk Factors:Diabetes.  Sonographer:    Webb Laws Referring Phys: [id number] ELIZABETH G MATHEWS IMPRESSIONS  1. Left ventricular ejection fraction, by estimation, is 60 to 65%. The left ventricle has normal function. The  left ventricle has no regional wall motion abnormalities. There is mild left ventricular hypertrophy of the basal-septal segment. Left ventricular diastolic parameters are consistent with Grade I diastolic dysfunction (impaired relaxation). Elevated left atrial pressure.  2. Right ventricular systolic function is normal. The right ventricular size is normal.  3. The mitral valve is normal in structure. Trivial mitral valve regurgitation. No evidence of mitral stenosis.  4. The aortic valve is tricuspid. Aortic valve regurgitation is not visualized. No aortic stenosis is present.  5. The inferior vena cava is normal in size with greater than 50% respiratory variability, suggesting right atrial pressure of 3 mmHg. Comparison(s): No prior Echocardiogram. FINDINGS  Left Ventricle: Left ventricular ejection fraction, by estimation, is 60 to 65%. The left ventricle has normal function. The left ventricle has no regional wall motion abnormalities. Definity contrast agent was given IV to delineate the left ventricular  endocardial borders. The left ventricular internal cavity size was normal in size. There is mild left ventricular hypertrophy of the basal-septal segment. Left ventricular diastolic parameters are consistent with Grade I diastolic dysfunction (impaired relaxation). Elevated left atrial pressure. Right Ventricle: The right ventricular size is normal. Right ventricular systolic function is normal. Left Atrium: Left atrial size was normal in size. Right Atrium: Right atrial size was normal in size. Pericardium: There is no evidence of pericardial effusion. Mitral Valve: The mitral valve is normal in structure. Trivial mitral valve regurgitation. No evidence of mitral valve stenosis. Tricuspid Valve: The tricuspid valve is normal in structure. Tricuspid valve regurgitation is trivial. No evidence of tricuspid stenosis. Aortic Valve: The aortic valve is tricuspid. Aortic valve regurgitation is not visualized. No  aortic stenosis is present. Pulmonic Valve: The pulmonic valve was normal in structure. Pulmonic valve regurgitation is trivial. No evidence of pulmonic stenosis. Aorta: The aortic root is normal in size and structure. Venous: The inferior vena cava is normal in size with greater than 50% respiratory variability, suggesting right atrial pressure of 3 mmHg. IAS/Shunts: No atrial level shunt detected by color flow Doppler.  LEFT VENTRICLE PLAX 2D LVIDd:         4.10 cm     Diastology LVIDs:         3.00 cm     LV e' medial:    4.13 cm/s LV PW:         1.10 cm     LV E/e' medial:  21.4 LV IVS:        1.40 cm     LV e' lateral:   8.05 cm/s LVOT diam:     1.80 cm     LV E/e' lateral: 11.0 LV SV:         59 LV SV Index:   39 LVOT Area:     2.54 cm  LV Volumes (MOD) LV vol d, MOD A2C: 65.0 ml LV vol d, MOD A4C: 91.1 ml LV vol s, MOD A2C: 32.9 ml LV vol s, MOD A4C: 30.6 ml LV SV MOD A2C:     32.1 ml LV SV MOD A4C:     91.1 ml LV SV MOD BP:      45.1 ml RIGHT VENTRICLE             IVC RV Basal diam:  3.40 cm     IVC diam: 1.40 cm RV S prime:     10.40 cm/s TAPSE (M-mode): 1.6 cm LEFT ATRIUM             Index        RIGHT ATRIUM           Index LA diam:        3.70 cm 2.40 cm/m   RA Area:     10.20 cm LA Vol (A2C):   33.3 ml 21.64 ml/m  RA Volume:   20.30 ml  13.19 ml/m LA Vol (A4C):   40.5 ml 26.32 ml/m LA Biplane Vol: 40.4 ml 26.26 ml/m  AORTIC VALVE             PULMONIC VALVE LVOT Vmax:   100.00 cm/s PR End Diast Vel: 1.13 msec LVOT Vmean:  69.300 cm/s LVOT VTI:    0.233 m  AORTA Ao Root diam: 2.70 cm Ao Asc diam:  3.30 cm MITRAL VALVE                TRICUSPID VALVE MV Area (PHT): 3.72  cm     TR Peak grad:   13.2 mmHg MV Decel Time: 204 msec     TR Vmax:        182.00 cm/s MV E velocity: 88.30 cm/s MV A velocity: 107.00 cm/s  SHUNTS MV E/A ratio:  0.83         Systemic VTI:  0.23 m                             Systemic Diam: 1.80 cm Olga Millers MD Electronically signed by Olga Millers MD Signature Date/Time:  02/19/2023/1:14:49 PM    Final     Anti-infectives: Anti-infectives (From admission, onward)    None       Assessment/Plan: Right colon mass  CT concerning for cancer - path confirms  FINAL MICROSCOPIC DIAGNOSIS:   A. COLON MASS, ASCENDING, BIOPSY:       Invasive adenocarcinoma, moderately differentiated.       See comment.   B. COLON, TRANSVERSE, POLYPECTOMY:       Tubular adenoma.       Negative for high-grade dysplasia.   LN involvement but not distant mest  Plan right colectomy- most likely tomorrow. Cleared per cardiology.  I went over surgery, risks, benefits with patient and her daughter. Questions welcomed and answered to their satisfaction.    LOS: 5 days    Debra Fry 02/21/2023

## 2023-02-21 NOTE — Progress Notes (Addendum)
3 Days Post-Op   Subjective/Chief Complaint: No complaints, has not noted any blood in stool    Objective: Vital signs in last 24 hours: Temp:  [97.6 F (36.4 C)-98.2 F (36.8 C)] 98.1 F (36.7 C) (01/27 0518) Pulse Rate:  [69-78] 77 (01/27 0518) Resp:  [16-20] 16 (01/27 0518) BP: (137-151)/(54-63) 151/63 (01/27 0518) SpO2:  [99 %-100 %] 100 % (01/27 0518) Last BM Date : 02/19/23  Intake/Output from previous day: 01/26 0701 - 01/27 0700 In: 560 [P.O.:560] Out: -  Intake/Output this shift: No intake/output data recorded.  GI: soft, non-tender; bowel sounds normal; no masses,  no organomegaly  Lab Results:  Recent Labs    02/19/23 0723  WBC 7.7  HGB 9.1*  HCT 29.8*  PLT 306   BMET Recent Labs    02/19/23 0723  NA 136  K 3.9  CL 100  CO2 25  GLUCOSE 93  BUN 13  CREATININE 0.77  CALCIUM 9.0   PT/INR No results for input(s): "LABPROT", "INR" in the last 72 hours. ABG No results for input(s): "PHART", "HCO3" in the last 72 hours.  Invalid input(s): "PCO2", "PO2"  Studies/Results: ECHOCARDIOGRAM COMPLETE Result Date: 02/19/2023    ECHOCARDIOGRAM REPORT   Patient Name:   Debra Fry Date of Exam: 02/19/2023 Medical Rec #:  [id number]        Height:       62.0 in Accession #:    [id number]       Weight:       120.0 lb Date of Birth:  08/23/1940       BSA:          1.539 m Patient Age:    83 years         BP:           140/41 mmHg Patient Gender: F                HR:           73 bpm. Exam Location:  Inpatient Procedure: 2D Echo, Cardiac Doppler, Color Doppler and Intracardiac            Opacification Agent Indications:    R94.31 Abnormal EKG  History:        Patient has no prior history of Echocardiogram examinations.                 Stroke; Risk Factors:Diabetes.  Sonographer:    Webb Laws Referring Phys: [id number] ELIZABETH G MATHEWS IMPRESSIONS  1. Left ventricular ejection fraction, by estimation, is 60 to 65%. The left ventricle has normal function. The  left ventricle has no regional wall motion abnormalities. There is mild left ventricular hypertrophy of the basal-septal segment. Left ventricular diastolic parameters are consistent with Grade I diastolic dysfunction (impaired relaxation). Elevated left atrial pressure.  2. Right ventricular systolic function is normal. The right ventricular size is normal.  3. The mitral valve is normal in structure. Trivial mitral valve regurgitation. No evidence of mitral stenosis.  4. The aortic valve is tricuspid. Aortic valve regurgitation is not visualized. No aortic stenosis is present.  5. The inferior vena cava is normal in size with greater than 50% respiratory variability, suggesting right atrial pressure of 3 mmHg. Comparison(s): No prior Echocardiogram. FINDINGS  Left Ventricle: Left ventricular ejection fraction, by estimation, is 60 to 65%. The left ventricle has normal function. The left ventricle has no regional wall motion abnormalities. Definity contrast agent was given IV to delineate the left ventricular  endocardial borders. The left ventricular internal cavity size was normal in size. There is mild left ventricular hypertrophy of the basal-septal segment. Left ventricular diastolic parameters are consistent with Grade I diastolic dysfunction (impaired relaxation). Elevated left atrial pressure. Right Ventricle: The right ventricular size is normal. Right ventricular systolic function is normal. Left Atrium: Left atrial size was normal in size. Right Atrium: Right atrial size was normal in size. Pericardium: There is no evidence of pericardial effusion. Mitral Valve: The mitral valve is normal in structure. Trivial mitral valve regurgitation. No evidence of mitral valve stenosis. Tricuspid Valve: The tricuspid valve is normal in structure. Tricuspid valve regurgitation is trivial. No evidence of tricuspid stenosis. Aortic Valve: The aortic valve is tricuspid. Aortic valve regurgitation is not visualized. No  aortic stenosis is present. Pulmonic Valve: The pulmonic valve was normal in structure. Pulmonic valve regurgitation is trivial. No evidence of pulmonic stenosis. Aorta: The aortic root is normal in size and structure. Venous: The inferior vena cava is normal in size with greater than 50% respiratory variability, suggesting right atrial pressure of 3 mmHg. IAS/Shunts: No atrial level shunt detected by color flow Doppler.  LEFT VENTRICLE PLAX 2D LVIDd:         4.10 cm     Diastology LVIDs:         3.00 cm     LV e' medial:    4.13 cm/s LV PW:         1.10 cm     LV E/e' medial:  21.4 LV IVS:        1.40 cm     LV e' lateral:   8.05 cm/s LVOT diam:     1.80 cm     LV E/e' lateral: 11.0 LV SV:         59 LV SV Index:   39 LVOT Area:     2.54 cm  LV Volumes (MOD) LV vol d, MOD A2C: 65.0 ml LV vol d, MOD A4C: 91.1 ml LV vol s, MOD A2C: 32.9 ml LV vol s, MOD A4C: 30.6 ml LV SV MOD A2C:     32.1 ml LV SV MOD A4C:     91.1 ml LV SV MOD BP:      45.1 ml RIGHT VENTRICLE             IVC RV Basal diam:  3.40 cm     IVC diam: 1.40 cm RV S prime:     10.40 cm/s TAPSE (M-mode): 1.6 cm LEFT ATRIUM             Index        RIGHT ATRIUM           Index LA diam:        3.70 cm 2.40 cm/m   RA Area:     10.20 cm LA Vol (A2C):   33.3 ml 21.64 ml/m  RA Volume:   20.30 ml  13.19 ml/m LA Vol (A4C):   40.5 ml 26.32 ml/m LA Biplane Vol: 40.4 ml 26.26 ml/m  AORTIC VALVE             PULMONIC VALVE LVOT Vmax:   100.00 cm/s PR End Diast Vel: 1.13 msec LVOT Vmean:  69.300 cm/s LVOT VTI:    0.233 m  AORTA Ao Root diam: 2.70 cm Ao Asc diam:  3.30 cm MITRAL VALVE                TRICUSPID VALVE MV Area (PHT): 3.72  cm     TR Peak grad:   13.2 mmHg MV Decel Time: 204 msec     TR Vmax:        182.00 cm/s MV E velocity: 88.30 cm/s MV A velocity: 107.00 cm/s  SHUNTS MV E/A ratio:  0.83         Systemic VTI:  0.23 m                             Systemic Diam: 1.80 cm Olga Millers MD Electronically signed by Olga Millers MD Signature Date/Time:  02/19/2023/1:14:49 PM    Final     Anti-infectives: Anti-infectives (From admission, onward)    None       Assessment/Plan: Right colon mass  CT concerning for cancer - path confirms  FINAL MICROSCOPIC DIAGNOSIS:   A. COLON MASS, ASCENDING, BIOPSY:       Invasive adenocarcinoma, moderately differentiated.       See comment.   B. COLON, TRANSVERSE, POLYPECTOMY:       Tubular adenoma.       Negative for high-grade dysplasia.   LN involvement but not distant mest  Plan right colectomy- most likely tomorrow. Cleared per cardiology.  I went over surgery, risks, benefits with patient and her daughter. Questions welcomed and answered to their satisfaction.    LOS: 5 days    Berna Bue 02/21/2023

## 2023-02-21 NOTE — Progress Notes (Signed)
PROGRESS NOTE    Debra Fry  WJX:[id number] DOB: 06-Sep-1940 DOA: 02/16/2023 PCP: Patient, No Pcp Per   Brief Narrative: 83 year old female with history of coronary artery disease, MI, GERD hiatal hernia stroke, hyperlipidemia, peptic ulcer disease, admitted with generalized weakness back pain and right upper extremity pain.  Patient was admitted recently to the hospital with similar complaints and was found to have a hemoglobin of 5.4 she was transfused at that time with 2 units of packed RBCs started on Protonix twice a day, EGD at that time showed small clean-based gastric ulcer with no evidence of active bleeding biopsies were negative for H. pylori.  GI followed the patient at that time.  FOBT is positive this admission.  GI was consulted. Patient reported having dark stools at home after discharge from the hospital.. Assessment & Plan:   Principal Problem:   Symptomatic anemia Active Problems:   Familial hyperlipidemia   Diabetes (HCC)   Hypothyroidism   Adjustment disorder with anxiety   Gout   Leucocytosis   #1 IDA/right colon mass- anemia panel from December 2024 with iron of 18 and TIBC elevated with low ferritin at 3 and saturation at 3. Last EGD in December 2024 showed 3 mm gastric ulcer and gastritis with negative biopsy for H. pylori.  Patient continues with anemia of unclear etiology.  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 General surgery planning for OR 02/22/2023 She will follow-up with Dr. Truett Perna after discharge  CT chest abdomen and pelvis  no mets    #2 type 2 diabetes she does not take anything at home a1c 5.8 CBG (last 3)  No results for input(s): "GLUCAP" in the last 72 hours.   #3 hypothyroidism her last TSH end of next December 2024 was within normal limits apparently she had stopped taking  Synthroid  #4 history of ischemic cardiomyopathy not on diuretics prior to admission Last echo January 2020 EF 55 to 60% Echo 02/19/2023 shows EF of 60 to 65%, no regional wall motion abnormalities, mild LVH grade 1 diastolic dysfunction. Seen by cardiology does not recommend any further workup prior to surgery.  #5 history of stroke without any residual effects  #6 hyperlipidemia not on statins LDL 105.  Cardiology recommends less than 55 for secondary prevention.  #7 B12 deficiency B12 level was 155 in December 2024 on B12 supplements  #8 anxiety on Neurontin carbamazepine  #9 vitamin D deficiency current continue supplementation.  Estimated body mass index is 21.95 kg/m as calculated from the following:   Height as of this encounter: 5\' 2"  (1.575 m).   Weight as of this encounter: 54.4 kg.  DVT prophylaxis: None Code Status: Full Family Communication: None at bedside  disposition Plan:  Status is: Inpatient Remains inpatient appropriate because: Acute illness GI bleed   Consultants:  gi Cardiology Surgery  Procedures: none  Antimicrobials:  Subjective:  No complaint  Objective: Vitals:   02/20/23 1343 02/20/23 1954 02/21/23 0518 02/21/23 1225  BP: (!) 147/59 (!) 137/54 (!) 151/63 (!) 143/53  Pulse: 78 69 77 81  Resp: 20 16 16 19   Temp: 97.6 F (36.4 C) 98.2 F (36.8 C) 98.1 F (36.7 C) 99.4 F (37.4 C)  TempSrc: Oral   Oral  SpO2: 99% 99% 100% 98%  Weight:      Height:        Intake/Output Summary (Last  24 hours) at 02/21/2023 1249 Last data filed at 02/21/2023 0900 Gross per 24 hour  Intake 440 ml  Output --  Net 440 ml   Filed Weights   02/16/23 1418  Weight: 54.4 kg    Examination:  General exam: Appears in no acute distress  respiratory system: Clear to auscultation. Respiratory effort normal. Cardiovascular system: S1 & S2 heard, RRR. No JVD, murmurs, rubs, gallops or clicks. No pedal edema. Gastrointestinal system: Abdomen is  nondistended, soft and nontender. No organomegaly or masses felt. Normal bowel sounds heard. Central nervous system: Alert and oriented. No focal neurological deficits. Extremities:no edema  Data Reviewed: I have personally reviewed following labs and imaging studies  CBC: Recent Labs  Lab 02/17/23 0907 02/17/23 1514 02/18/23 0045 02/18/23 0454 02/19/23 0723  WBC 6.7 7.4 9.2 8.7 7.7  HGB 7.3* 7.7* 8.5* 8.3* 9.1*  HCT 24.5* 26.6* 27.9* 26.9* 29.8*  MCV 71.2* 71.3* 75.0* 73.3* 73.6*  PLT 284 305 272 265 306   Basic Metabolic Panel: Recent Labs  Lab 02/16/23 1537 02/17/23 0525 02/18/23 0454 02/19/23 0723  NA 135 136 133* 136  K 4.0 3.8 3.7 3.9  CL 102 104 101 100  CO2 24 25 21* 25  GLUCOSE 115* 102* 87 93  BUN 17 15 16 13   CREATININE 0.51 0.39* 0.64 0.77  CALCIUM 9.3 9.1 8.8* 9.0   GFR: Estimated Creatinine Clearance: 44.4 mL/min (by C-G formula based on SCr of 0.77 mg/dL). Liver Function Tests: Recent Labs  Lab 02/16/23 1537 02/17/23 0525 02/18/23 0454 02/19/23 0723  AST 18 17 17 19   ALT 10 12 11 14   ALKPHOS 67 57 57 63  BILITOT 0.8 1.1 1.5* 1.4*  PROT 7.9 7.3 6.9 7.5  ALBUMIN 3.6 3.2* 3.0* 3.2*   No results for input(s): "LIPASE", "AMYLASE" in the last 168 hours. No results for input(s): "AMMONIA" in the last 168 hours. Coagulation Profile: No results for input(s): "INR", "PROTIME" in the last 168 hours. Cardiac Enzymes: No results for input(s): "CKTOTAL", "CKMB", "CKMBINDEX", "TROPONINI" in the last 168 hours. BNP (last 3 results) No results for input(s): "PROBNP" in the last 8760 hours. HbA1C: No results for input(s): "HGBA1C" in the last 72 hours.  CBG: No results for input(s): "GLUCAP" in the last 168 hours. Lipid Profile: No results for input(s): "CHOL", "HDL", "LDLCALC", "TRIG", "CHOLHDL", "LDLDIRECT" in the last 72 hours.  Thyroid Function Tests: No results for input(s): "TSH", "T4TOTAL", "FREET4", "T3FREE", "THYROIDAB" in the last 72  hours. Anemia Panel: No results for input(s): "VITAMINB12", "FOLATE", "FERRITIN", "TIBC", "IRON", "RETICCTPCT" in the last 72 hours. Sepsis Labs: No results for input(s): "PROCALCITON", "LATICACIDVEN" in the last 168 hours.  No results found for this or any previous visit (from the past 240 hours).   Radiology Studies: No results found.   Scheduled Meds:  [START ON 02/22/2023] acetaminophen  1,000 mg Oral On Call to OR   [START ON 02/22/2023] alvimopan  12 mg Oral On Call to OR   [START ON 02/22/2023] bupivacaine liposome  20 mL Infiltration Once   Chlorhexidine Gluconate Cloth  6 each Topical Once   And   [START ON 02/22/2023] Chlorhexidine Gluconate Cloth  6 each Topical Once   vitamin D3  2,000 Units Oral Daily   cyanocobalamin  1,000 mcg Oral Daily   [START ON 02/22/2023] feeding supplement  296 mL Oral Once   feeding supplement  592 mL Oral Once   [START ON 02/22/2023] heparin  5,000 Units Subcutaneous On Call to OR  neomycin  1,000 mg Oral 3 times per day   And   metroNIDAZOLE  1,000 mg Oral 3 times per day   pantoprazole (PROTONIX) IV  40 mg Intravenous Q12H   Vitamin D (Ergocalciferol)  50,000 Units Oral Q7 days   Continuous Infusions:  [START ON 02/22/2023] cefoTEtan (CEFOTAN) IV        LOS: 5 days    Time spent: 38 min Alwyn Ren, MD  02/21/2023, 12:49 PM

## 2023-02-21 NOTE — Plan of Care (Signed)

## 2023-02-22 ENCOUNTER — Encounter (HOSPITAL_COMMUNITY)
Admission: EM | Disposition: A | Discharge status: Home / Self Care | Payer: Self-pay | Source: Home / Self Care | Attending: Internal Medicine

## 2023-02-22 ENCOUNTER — Other Ambulatory Visit: Payer: Self-pay

## 2023-02-22 ENCOUNTER — Encounter (HOSPITAL_COMMUNITY): Payer: Self-pay | Admitting: Internal Medicine

## 2023-02-22 ENCOUNTER — Inpatient Hospital Stay (HOSPITAL_COMMUNITY): Payer: Medicare HMO | Admitting: Anesthesiology

## 2023-02-22 DIAGNOSIS — C182 Malignant neoplasm of ascending colon: Secondary | ICD-10-CM

## 2023-02-22 DIAGNOSIS — K449 Diaphragmatic hernia without obstruction or gangrene: Secondary | ICD-10-CM | POA: Diagnosis not present

## 2023-02-22 DIAGNOSIS — D649 Anemia, unspecified: Secondary | ICD-10-CM | POA: Diagnosis not present

## 2023-02-22 DIAGNOSIS — K635 Polyp of colon: Secondary | ICD-10-CM | POA: Diagnosis not present

## 2023-02-22 HISTORY — PX: LAPAROSCOPIC PARTIAL RIGHT COLECTOMY: SHX5913

## 2023-02-22 LAB — BASIC METABOLIC PANEL
Anion gap: 10 (ref 5–15)
BUN: 9 mg/dL (ref 8–23)
CO2: 22 mmol/L (ref 22–32)
Calcium: 8.6 mg/dL — ABNORMAL LOW (ref 8.9–10.3)
Chloride: 100 mmol/L (ref 98–111)
Creatinine, Ser: 0.86 mg/dL (ref 0.44–1.00)
GFR, Estimated: >60 mL/min (ref >60)
Glucose, Bld: 117 mg/dL — ABNORMAL HIGH (ref 70–99)
Potassium: 3.6 mmol/L (ref 3.5–5.1)
Sodium: 132 mmol/L — ABNORMAL LOW (ref 135–145)

## 2023-02-22 LAB — CBC WITH DIFFERENTIAL/PLATELET
Abs Immature Granulocytes: 0.01 10*3/uL (ref 0.00–0.07)
Basophils Absolute: 0 10*3/uL (ref 0.0–0.1)
Basophils Relative: 0 %
Eosinophils Absolute: 0 10*3/uL (ref 0.0–0.5)
Eosinophils Relative: 1 %
HCT: 30.4 % — ABNORMAL LOW (ref 36.0–46.0)
Hemoglobin: 9.2 g/dL — ABNORMAL LOW (ref 12.0–15.0)
Immature Granulocytes: 0 %
Lymphocytes Relative: 26 %
Lymphs Abs: 0.7 10*3/uL (ref 0.7–4.0)
MCH: 22.8 pg — ABNORMAL LOW (ref 26.0–34.0)
MCHC: 30.3 g/dL (ref 30.0–36.0)
MCV: 75.4 fL — ABNORMAL LOW (ref 80.0–100.0)
Monocytes Absolute: 0.5 10*3/uL (ref 0.1–1.0)
Monocytes Relative: 18 %
Neutro Abs: 1.5 10*3/uL — ABNORMAL LOW (ref 1.7–7.7)
Neutrophils Relative %: 55 %
Platelets: 281 10*3/uL (ref 150–400)
RBC: 4.03 MIL/uL (ref 3.87–5.11)
RDW: 24.3 % — ABNORMAL HIGH (ref 11.5–15.5)
Smear Review: ADEQUATE
WBC: 2.7 10*3/uL — ABNORMAL LOW (ref 4.0–10.5)
nRBC: 0 % (ref 0.0–0.2)

## 2023-02-22 LAB — TYPE AND SCREEN
ABO/RH(D): A POS
Antibody Screen: NEGATIVE

## 2023-02-22 SURGERY — LAPAROSCOPIC PARTIAL RIGHT COLECTOMY
Anesthesia: General | Site: Abdomen | Laterality: Right

## 2023-02-22 MED ORDER — ROCURONIUM BROMIDE 10 MG/ML (PF) SYRINGE
PREFILLED_SYRINGE | INTRAVENOUS | Status: AC
  Filled 2023-02-22: qty 10

## 2023-02-22 MED ORDER — HYDROMORPHONE HCL 1 MG/ML IJ SOLN: 0.5000 mg | INTRAMUSCULAR | Status: DC | PRN

## 2023-02-22 MED ORDER — LACTATED RINGERS IV SOLN: INTRAVENOUS | Status: DC | PRN

## 2023-02-22 MED ORDER — BUPIVACAINE-EPINEPHRINE 0.25% -1:200000 IJ SOLN
INTRAMUSCULAR | Status: DC | PRN
  Administered 2023-02-22: 50 mL

## 2023-02-22 MED ORDER — BUPIVACAINE LIPOSOME 1.3 % IJ SUSP
INTRAMUSCULAR | Status: AC
  Filled 2023-02-22: qty 20

## 2023-02-22 MED ORDER — BUPIVACAINE-EPINEPHRINE 0.25% -1:200000 IJ SOLN
INTRAMUSCULAR | Status: AC
  Filled 2023-02-22: qty 1

## 2023-02-22 MED ORDER — OXYCODONE HCL 5 MG PO TABS
5.0000 mg | ORAL_TABLET | ORAL | Status: DC | PRN
  Administered 2023-02-23 – 2023-02-26 (×7): 5 mg via ORAL
  Filled 2023-02-22 (×8): qty 1

## 2023-02-22 MED ORDER — DROPERIDOL 2.5 MG/ML IJ SOLN
INTRAMUSCULAR | Status: AC
  Filled 2023-02-22: qty 2

## 2023-02-22 MED ORDER — HYDROMORPHONE HCL 1 MG/ML IJ SOLN
INTRAMUSCULAR | Status: DC | PRN
  Administered 2023-02-22: .2 mg via INTRAVENOUS
  Administered 2023-02-22: .4 mg via INTRAVENOUS

## 2023-02-22 MED ORDER — BOOST / RESOURCE BREEZE PO LIQD CUSTOM
1.0000 | Freq: Three times a day (TID) | ORAL | Status: DC
  Administered 2023-02-22 – 2023-02-24 (×4): 1 via ORAL

## 2023-02-22 MED ORDER — ACETAMINOPHEN 325 MG PO TABS: 325.0000 mg | ORAL_TABLET | Freq: Once | ORAL | Status: DC | PRN

## 2023-02-22 MED ORDER — CHLORHEXIDINE GLUCONATE 0.12 % MT SOLN
15.0000 mL | Freq: Once | OROMUCOSAL | Status: AC
  Administered 2023-02-22: 15 mL via OROMUCOSAL

## 2023-02-22 MED ORDER — PHENYLEPHRINE 80 MCG/ML (10ML) SYRINGE FOR IV PUSH (FOR BLOOD PRESSURE SUPPORT)
PREFILLED_SYRINGE | INTRAVENOUS | Status: AC
  Filled 2023-02-22: qty 10

## 2023-02-22 MED ORDER — LACTATED RINGERS IV SOLN: INTRAVENOUS | Status: DC

## 2023-02-22 MED ORDER — HYDROMORPHONE HCL 1 MG/ML IJ SOLN
INTRAMUSCULAR | Status: AC
  Administered 2023-02-22: 0.5 mg via INTRAVENOUS
  Filled 2023-02-22: qty 1

## 2023-02-22 MED ORDER — HYDROMORPHONE HCL 1 MG/ML IJ SOLN
0.2500 mg | INTRAMUSCULAR | Status: DC | PRN
  Administered 2023-02-22 (×3): 0.25 mg via INTRAVENOUS

## 2023-02-22 MED ORDER — FENTANYL CITRATE (PF) 100 MCG/2ML IJ SOLN
INTRAMUSCULAR | Status: AC
  Filled 2023-02-22: qty 2

## 2023-02-22 MED ORDER — PROPOFOL 10 MG/ML IV BOLUS
INTRAVENOUS | Status: AC
  Filled 2023-02-22: qty 20

## 2023-02-22 MED ORDER — LIDOCAINE HCL (PF) 2 % IJ SOLN
INTRAMUSCULAR | Status: AC
  Filled 2023-02-22: qty 5

## 2023-02-22 MED ORDER — DROPERIDOL 2.5 MG/ML IJ SOLN
0.6250 mg | Freq: Once | INTRAMUSCULAR | Status: AC | PRN
Start: 2023-02-22 — End: 2023-02-22
  Administered 2023-02-22: 0.625 mg via INTRAVENOUS

## 2023-02-22 MED ORDER — PROPOFOL 10 MG/ML IV BOLUS
INTRAVENOUS | Status: DC | PRN
  Administered 2023-02-22: 110 mg via INTRAVENOUS

## 2023-02-22 MED ORDER — FENTANYL CITRATE (PF) 100 MCG/2ML IJ SOLN
INTRAMUSCULAR | Status: DC | PRN
  Administered 2023-02-22 (×2): 50 ug via INTRAVENOUS

## 2023-02-22 MED ORDER — ONDANSETRON HCL 4 MG/2ML IJ SOLN
INTRAMUSCULAR | Status: AC
  Filled 2023-02-22: qty 2

## 2023-02-22 MED ORDER — DEXAMETHASONE SODIUM PHOSPHATE 10 MG/ML IJ SOLN
INTRAMUSCULAR | Status: DC | PRN
  Administered 2023-02-22: 8 mg via INTRAVENOUS

## 2023-02-22 MED ORDER — ACETAMINOPHEN 160 MG/5ML PO SOLN: 325.0000 mg | Freq: Once | ORAL | Status: DC | PRN

## 2023-02-22 MED ORDER — METHOCARBAMOL 1000 MG/10ML IJ SOLN: 500.0000 mg | Freq: Four times a day (QID) | INTRAMUSCULAR | Status: DC | PRN

## 2023-02-22 MED ORDER — PHENYLEPHRINE HCL-NACL 20-0.9 MG/250ML-% IV SOLN
INTRAVENOUS | Status: AC
  Filled 2023-02-22: qty 250

## 2023-02-22 MED ORDER — ENOXAPARIN SODIUM 40 MG/0.4ML IJ SOSY
40.0000 mg | PREFILLED_SYRINGE | INTRAMUSCULAR | Status: DC
  Administered 2023-02-22 – 2023-02-24 (×3): 40 mg via SUBCUTANEOUS
  Filled 2023-02-22 (×3): qty 0.4

## 2023-02-22 MED ORDER — ACETAMINOPHEN 10 MG/ML IV SOLN: 1000.0000 mg | Freq: Once | INTRAVENOUS | Status: DC | PRN

## 2023-02-22 MED ORDER — ROCURONIUM BROMIDE 100 MG/10ML IV SOLN
INTRAVENOUS | Status: DC | PRN
  Administered 2023-02-22: 20 mg via INTRAVENOUS
  Administered 2023-02-22: 50 mg via INTRAVENOUS

## 2023-02-22 MED ORDER — DEXAMETHASONE SODIUM PHOSPHATE 10 MG/ML IJ SOLN
INTRAMUSCULAR | Status: AC
  Filled 2023-02-22: qty 1

## 2023-02-22 MED ORDER — MEPERIDINE HCL 50 MG/ML IJ SOLN: 6.2500 mg | INTRAMUSCULAR | Status: DC | PRN

## 2023-02-22 MED ORDER — HYDROMORPHONE HCL 2 MG/ML IJ SOLN
INTRAMUSCULAR | Status: AC
  Filled 2023-02-22: qty 1

## 2023-02-22 MED ORDER — ACETAMINOPHEN 500 MG PO TABS
1000.0000 mg | ORAL_TABLET | Freq: Four times a day (QID) | ORAL | Status: DC
  Administered 2023-02-22 – 2023-02-27 (×13): 1000 mg via ORAL
  Filled 2023-02-22 (×16): qty 2

## 2023-02-22 MED ORDER — SUGAMMADEX SODIUM 200 MG/2ML IV SOLN
INTRAVENOUS | Status: DC | PRN
  Administered 2023-02-22: 200 mg via INTRAVENOUS

## 2023-02-22 MED ORDER — LIDOCAINE HCL (CARDIAC) PF 100 MG/5ML IV SOSY
PREFILLED_SYRINGE | INTRAVENOUS | Status: DC | PRN
  Administered 2023-02-22: 40 mg via INTRAVENOUS

## 2023-02-22 MED ORDER — PHENYLEPHRINE HCL (PRESSORS) 10 MG/ML IV SOLN
INTRAVENOUS | Status: DC | PRN
  Administered 2023-02-22: 80 ug via INTRAVENOUS
  Administered 2023-02-22 (×2): 160 ug via INTRAVENOUS

## 2023-02-22 MED ORDER — EPHEDRINE SULFATE (PRESSORS) 50 MG/ML IJ SOLN
INTRAMUSCULAR | Status: DC | PRN
  Administered 2023-02-22: 10 mg via INTRAVENOUS
  Administered 2023-02-22: 5 mg via INTRAVENOUS

## 2023-02-22 MED ORDER — ONDANSETRON HCL 4 MG/2ML IJ SOLN
INTRAMUSCULAR | Status: DC | PRN
  Administered 2023-02-22: 4 mg via INTRAVENOUS

## 2023-02-22 SURGICAL SUPPLY — 69 items
APPLIER CLIP 5 13 M/L LIGAMAX5 (MISCELLANEOUS)
APPLIER CLIP ROT 10 11.4 M/L (STAPLE)
BAG COUNTER SPONGE SURGICOUNT (BAG) IMPLANT
BENZOIN TINCTURE PRP APPL 2/3 (GAUZE/BANDAGES/DRESSINGS) IMPLANT
BLADE EXTENDED COATED 6.5IN (ELECTRODE) IMPLANT
CABLE HIGH FREQUENCY MONO STRZ (ELECTRODE) ×1 IMPLANT
CELLS DAT CNTRL 66122 CELL SVR (MISCELLANEOUS) ×1 IMPLANT
CLIP APPLIE 5 13 M/L LIGAMAX5 (MISCELLANEOUS) IMPLANT
CLIP APPLIE ROT 10 11.4 M/L (STAPLE) IMPLANT
CLSR STERI-STRIP ANTIMIC 1/2X4 (GAUZE/BANDAGES/DRESSINGS) IMPLANT
COVER SURGICAL LIGHT HANDLE (MISCELLANEOUS) ×2 IMPLANT
DERMABOND ADVANCED .7 DNX12 (GAUZE/BANDAGES/DRESSINGS) IMPLANT
DRAIN CHANNEL 19F RND (DRAIN) IMPLANT
DRSG OPSITE POSTOP 4X10 (GAUZE/BANDAGES/DRESSINGS) IMPLANT
DRSG OPSITE POSTOP 4X6 (GAUZE/BANDAGES/DRESSINGS) IMPLANT
DRSG OPSITE POSTOP 4X8 (GAUZE/BANDAGES/DRESSINGS) IMPLANT
DRSG TEGADERM 2-3/8X2-3/4 SM (GAUZE/BANDAGES/DRESSINGS) IMPLANT
ELECT REM PT RETURN 15FT ADLT (MISCELLANEOUS) ×1 IMPLANT
ENDOLOOP SUT PDS II 0 18 (SUTURE) IMPLANT
GAUZE SPONGE 4X4 12PLY STRL (GAUZE/BANDAGES/DRESSINGS) IMPLANT
GLOVE BIO SURGEON STRL SZ 6 (GLOVE) ×2 IMPLANT
GLOVE INDICATOR 6.5 STRL GRN (GLOVE) ×2 IMPLANT
GLOVE SS BIOGEL STRL SZ 6 (GLOVE) ×1 IMPLANT
GOWN STRL REUS W/ TWL LRG LVL3 (GOWN DISPOSABLE) ×2 IMPLANT
GOWN STRL REUS W/ TWL XL LVL3 (GOWN DISPOSABLE) IMPLANT
IRRIG SUCT STRYKERFLOW 2 WTIP (MISCELLANEOUS) ×1
IRRIGATION SUCT STRKRFLW 2 WTP (MISCELLANEOUS) ×1 IMPLANT
KIT TURNOVER KIT A (KITS) IMPLANT
LIGASURE IMPACT 36 18CM CVD LR (INSTRUMENTS) IMPLANT
NDL INSUFFLATION 14GA 120MM (NEEDLE) ×1 IMPLANT
NEEDLE INSUFFLATION 14GA 120MM (NEEDLE) ×1 IMPLANT
PACK COLON (CUSTOM PROCEDURE TRAY) ×1 IMPLANT
PAD POSITIONING PINK XL (MISCELLANEOUS) IMPLANT
PENCIL SMOKE EVACUATOR (MISCELLANEOUS) IMPLANT
RELOAD PROXIMATE 75MM BLUE (ENDOMECHANICALS) ×2 IMPLANT
RELOAD STAPLE 60 2.6 WHT THN (STAPLE) IMPLANT
RELOAD STAPLE 75 3.8 BLU REG (ENDOMECHANICALS) IMPLANT
RELOAD STAPLER WHITE 60MM (STAPLE) IMPLANT
RETRACTOR WND ALEXIS 18 MED (MISCELLANEOUS) IMPLANT
RTRCTR WOUND ALEXIS 18CM MED (MISCELLANEOUS) ×1
SCISSORS LAP 5X35 DISP (ENDOMECHANICALS) ×1 IMPLANT
SET TUBE SMOKE EVAC HIGH FLOW (TUBING) ×1 IMPLANT
SHEARS HARMONIC 36 ACE (MISCELLANEOUS) ×1 IMPLANT
SLEEVE Z-THREAD 5X100MM (TROCAR) ×2 IMPLANT
SPIKE FLUID TRANSFER (MISCELLANEOUS) ×1 IMPLANT
STAPLER ECHELON LONG 60 440 (INSTRUMENTS) IMPLANT
STAPLER GUN LINEAR PROX 60 (STAPLE) IMPLANT
STAPLER PROXIMATE 75MM BLUE (STAPLE) IMPLANT
STAPLER RELOAD WHITE 60MM (STAPLE)
STAPLER SKIN PROX WIDE 3.9 (STAPLE) IMPLANT
SUT PDS AB 0 CT1 36 (SUTURE) IMPLANT
SUT PDS AB 1 TP1 96 (SUTURE) IMPLANT
SUT PROLENE 2 0 KS (SUTURE) IMPLANT
SUT PROLENE 2 0 SH DA (SUTURE) IMPLANT
SUT SILK 2 0 SH CR/8 (SUTURE) ×1 IMPLANT
SUT SILK 2-0 18XBRD TIE 12 (SUTURE) ×1 IMPLANT
SUT SILK 3 0 SH CR/8 (SUTURE) ×1 IMPLANT
SUT SILK 3-0 18XBRD TIE 12 (SUTURE) ×1 IMPLANT
SUT VIC AB 2-0 SH 27X BRD (SUTURE) IMPLANT
SUT VIC AB 3-0 SH 27XBRD (SUTURE) IMPLANT
SYS LAPSCP GELPORT 120MM (MISCELLANEOUS)
SYS WOUND ALEXIS 18CM MED (MISCELLANEOUS)
SYSTEM LAPSCP GELPORT 120MM (MISCELLANEOUS) IMPLANT
SYSTEM WOUND ALEXIS 18CM MED (MISCELLANEOUS) IMPLANT
TRAY FOLEY MTR SLVR 16FR STAT (SET/KITS/TRAYS/PACK) IMPLANT
TRAY FOLEY SLVR 14FR TEMP STAT (SET/KITS/TRAYS/PACK) IMPLANT
TROCAR ADV FIXATION 12X100MM (TROCAR) IMPLANT
TROCAR Z-THREAD OPTICAL 5X100M (TROCAR) ×1 IMPLANT
TUBING CONNECTING 10 (TUBING) ×2 IMPLANT

## 2023-02-22 NOTE — Op Note (Signed)
Operative Note  Debra Fry  [id number]  [id number]  02/22/2023   Surgeon: Phylliss Blakes MD FACS   Procedure performed: Laparoscopic right hemicolectomy   Preop diagnosis: Invasive adenocarcinoma of the ascending colon Post-op diagnosis/intraop findings: Same   Specimens: Right colon Retained items: no  EBL: Minimal cc Complications: none   Description of procedure: After confirming informed consent the patient was taken to the operating room and placed supine on the operating room table where general endotracheal anesthesia was initiated, preoperative antibiotics were administered, SCDs applied, and a formal timeout was performed.  A Foley catheter was placed which is removed at the end of the case.  The abdomen was prepped and draped in the usual sterile fashion.  Peritoneal access was gained with an optical entry in the left upper quadrant followed by uneventful insufflation to 15 mmHg.  The abdominal cavity was surveyed and there was no evidence of injury, or injury.  No evidence of metastasis on the liver or peritoneal surfaces.  Under direct visualization 2 additional 5 mm trocars were placed in the left hemiabdomen and infraumbilical position.  The patient was then placed in Trendelenburg and rotated to the left.  The omentum and small bowel were able to be reflected cephalad.  The appendix was grasped and retracted anterior cephalad and a combination of harmonic scalpel and blunt dissection were used to enter the plane between the mesentery and peritoneum.  Much of this dissection was able to be done bluntly.  We were able to visualize the duodenum which was carefully protected.  The lateral attachments of the ascending colon were divided with the harmonic scalpel and continued blunt dissection used to develop the plane along the mesentery.  At this point the patient was repositioned to reverse Trendelenburg.  The hepatic flexure was mobilized, dissecting some of the omentum off of the  proximal transverse colon.  We continued mobilizing the proximal transverse colon and hepatic flexure, carefully protecting the duodenum which was visible at the base of our dissection.  At this point the right colon was completely mobilized.  A small supraumbilical vertical midline incision was made and the Alexis wound protector was inserted.  The specimen was eviscerated.  The small bowel was divided with a blue load linear cutting stapler several centimeters proximal to an area of tethering along the terminal ileum.  The transverse colon was transected, preserving the middle colic blood supply.  The mesentery to the specimen was divided with the LigaSure.  The ileocolic pedicle was isolated, clamped, and then divided with LigaSure.  This was reinforced with a 2-0 silk suture ligation.  The Rickard Patience was then handed off and sent for routine pathology.  The abdominal cavity was inspected and hemostasis ensured.  The transected terminal ileum and transverse colon were aligned, ensuring that the mesentery had not twisted in doing so.  Enterotomies were created with the cautery and then and ileocolic side-to-side, functional in anastomosis was created with 75 mm blue load Endo GIA stapler.  The common enterotomy was closed with a TX 60 blue load.  The corners of this last blue load were imbricated with seromuscular 3-0 silk sutures.  An additional 3-0 silk suture was placed at the apex of the anastomotic staple line.  On completion, the stenosis is widely palpably patent, free of tension and appears well-perfused and viable.  This was gently returned to the abdominal cavity and remaining omentum was brought to gently rest on top of this.  The abdomen was irrigated with a  liter of warm sterile saline and the effluent was clear.  At this point the clean/dirty protocol proceeded with exchange of all drapes, instruments, gowns and gloves for cleaning once.  The midline incision was closed with running looped #1 PDS  starting at either end and tying centrally.  The abdomen was then reinsufflated and the laparoscope reinserted.  The fascial closure was confirmed to be airtight and free of any entrapped structures.  Hemostasis was once again confirmed.  The abdomen was desufflated and all trocars removed.  The skin incisions were closed with subcuticular 4-0 Monocryl followed by sterile dressings.  The patient was then awakened, extubated and taken to PACU in stable condition.    All counts were correct at the completion of the case.

## 2023-02-22 NOTE — Progress Notes (Signed)
PROGRESS NOTE    Debra Fry  ZOX:[id number] DOB: March 18, 1940 DOA: 02/16/2023 PCP: Patient, No Pcp Per   Brief Narrative: 83 year old female with history of coronary artery disease, MI, GERD hiatal hernia stroke, hyperlipidemia, peptic ulcer disease, admitted with generalized weakness back pain and right upper extremity pain.  Patient was admitted recently to the hospital with similar complaints and was found to have a hemoglobin of 5.4 she was transfused at that time with 2 units of packed RBCs started on Protonix twice a day, EGD at that time showed small clean-based gastric ulcer with no evidence of active bleeding biopsies were negative for H. pylori.  GI followed the patient at that time.  FOBT is positive this admission.  GI was consulted. Patient reported having dark stools at home after discharge from the hospital.. Assessment & Plan:   Principal Problem:   Symptomatic anemia Active Problems:   Familial hyperlipidemia   Diabetes (HCC)   Hypothyroidism   Adjustment disorder with anxiety   Gout   Leucocytosis   #1  Colon mass status post laparoscopic right hemicolectomy 02/22/2023.   Anemia panel from December 2024 with iron of 18 and TIBC elevated with low ferritin at 3 and saturation at 3. Last EGD in December 2024 showed 3 mm gastric ulcer and gastritis with negative biopsy for H. pylori.  Patient continues with anemia of unclear etiology.  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 She will follow-up with Dr. Truett Perna after discharge  CT chest abdomen and pelvis  no mets CEA 0.8    #2 type 2 diabetes she does not take anything at home a1c 5.8 CBG (last 3)  No results for input(s): "GLUCAP" in the last 72 hours.   #3 hypothyroidism her last TSH end of next December 2024 was within normal limits apparently she had stopped  taking Synthroid Added TSH to labs for tomorrow  #4 history of ischemic cardiomyopathy not on diuretics prior to admission Last echo January 2020 EF 55 to 60% Echo 02/19/2023 shows EF of 60 to 65%, no regional wall motion abnormalities, mild LVH grade 1 diastolic dysfunction. Seen by cardiology does not recommend any further workup prior to surgery.  #5 history of stroke without any residual effects  #6 hyperlipidemia not on statins LDL 105.  Cardiology recommends less than 55 for secondary prevention.  #7 B12 deficiency B12 level was 155 in December 2024 on B12 supplements  #8 anxiety on Neurontin carbamazepine  #9 vitamin D deficiency current continue supplementation.  Estimated body mass index is 21.95 kg/m as calculated from the following:   Height as of this encounter: 5\' 2"  (1.575 m).   Weight as of this encounter: 54.4 kg.  DVT prophylaxis: None Code Status: Full Family Communication: None at bedside  disposition Plan:  Status is: Inpatient Remains inpatient appropriate because: Acute illness GI bleed   Consultants:  gi Cardiology Surgery  Procedures: none  Antimicrobials:  Subjective:  Anxious prior to surgery Objective: Vitals:   02/22/23 1245 02/22/23 1300 02/22/23 1315 02/22/23 1343  BP: (!) 167/43 (!) 163/48 (!) 149/39 (!) 146/72  Pulse: 76 70 70 72  Resp: 11 13 10    Temp:    98.2 F (36.8 C)  TempSrc:      SpO2: 99% 98% 97% 96%  Weight:      Height:        Intake/Output  Summary (Last 24 hours) at 02/22/2023 1559 Last data filed at 02/22/2023 1500 Gross per 24 hour  Intake 1699.33 ml  Output 40 ml  Net 1659.33 ml   Filed Weights   02/16/23 1418  Weight: 54.4 kg    Examination:  General exam: Appears in no acute distress  respiratory system: Clear to auscultation. Respiratory effort normal. Cardiovascular system: S1 & S2 heard, RRR. No JVD, murmurs, rubs, gallops or clicks. No pedal edema. Gastrointestinal system: Abdomen is  nondistended, soft and nontender. No organomegaly or masses felt. Normal bowel sounds heard. Central nervous system: Alert and oriented. No focal neurological deficits. Extremities:no edema  Data Reviewed: I have personally reviewed following labs and imaging studies  CBC: Recent Labs  Lab 02/17/23 1514 02/18/23 0045 02/18/23 0454 02/19/23 0723 02/22/23 0524  WBC 7.4 9.2 8.7 7.7 2.7*  NEUTROABS  --   --   --   --  1.5*  HGB 7.7* 8.5* 8.3* 9.1* 9.2*  HCT 26.6* 27.9* 26.9* 29.8* 30.4*  MCV 71.3* 75.0* 73.3* 73.6* 75.4*  PLT 305 272 265 306 281   Basic Metabolic Panel: Recent Labs  Lab 02/16/23 1537 02/17/23 0525 02/18/23 0454 02/19/23 0723 02/22/23 0524  NA 135 136 133* 136 132*  K 4.0 3.8 3.7 3.9 3.6  CL 102 104 101 100 100  CO2 24 25 21* 25 22  GLUCOSE 115* 102* 87 93 117*  BUN 17 15 16 13 9   CREATININE 0.51 0.39* 0.64 0.77 0.86  CALCIUM 9.3 9.1 8.8* 9.0 8.6*   GFR: Estimated Creatinine Clearance: 41.3 mL/min (by C-G formula based on SCr of 0.86 mg/dL). Liver Function Tests: Recent Labs  Lab 02/16/23 1537 02/17/23 0525 02/18/23 0454 02/19/23 0723  AST 18 17 17 19   ALT 10 12 11 14   ALKPHOS 67 57 57 63  BILITOT 0.8 1.1 1.5* 1.4*  PROT 7.9 7.3 6.9 7.5  ALBUMIN 3.6 3.2* 3.0* 3.2*   No results for input(s): "LIPASE", "AMYLASE" in the last 168 hours. No results for input(s): "AMMONIA" in the last 168 hours. Coagulation Profile: No results for input(s): "INR", "PROTIME" in the last 168 hours. Cardiac Enzymes: No results for input(s): "CKTOTAL", "CKMB", "CKMBINDEX", "TROPONINI" in the last 168 hours. BNP (last 3 results) No results for input(s): "PROBNP" in the last 8760 hours. HbA1C: No results for input(s): "HGBA1C" in the last 72 hours.  CBG: No results for input(s): "GLUCAP" in the last 168 hours. Lipid Profile: No results for input(s): "CHOL", "HDL", "LDLCALC", "TRIG", "CHOLHDL", "LDLDIRECT" in the last 72 hours.  Thyroid Function Tests: No results  for input(s): "TSH", "T4TOTAL", "FREET4", "T3FREE", "THYROIDAB" in the last 72 hours. Anemia Panel: No results for input(s): "VITAMINB12", "FOLATE", "FERRITIN", "TIBC", "IRON", "RETICCTPCT" in the last 72 hours. Sepsis Labs: No results for input(s): "PROCALCITON", "LATICACIDVEN" in the last 168 hours.  No results found for this or any previous visit (from the past 240 hours).   Radiology Studies: No results found.   Scheduled Meds:  acetaminophen  1,000 mg Oral Q6H   vitamin D3  2,000 Units Oral Daily   cyanocobalamin  1,000 mcg Oral Daily   droperidol       enoxaparin (LOVENOX) injection  40 mg Subcutaneous Q24H   feeding supplement  1 Container Oral TID BM   pantoprazole (PROTONIX) IV  40 mg Intravenous Q12H   phenylephrine       Vitamin D (Ergocalciferol)  50,000 Units Oral Q7 days   Continuous Infusions:  lactated ringers  LOS: 6 days    Time spent: 38 min Alwyn Ren, MD  02/22/2023, 3:59 PM

## 2023-02-22 NOTE — Plan of Care (Signed)
Problem: Education: Goal: Knowledge of General Education information will improve Description: Including pain rating scale, medication(s)/side effects and non-pharmacologic comfort measures 02/22/2023 1404 by Fraser Din, RN Outcome: Progressing 02/22/2023 0910 by Fraser Din, RN Outcome: Progressing   Problem: Health Behavior/Discharge Planning: Goal: Ability to manage health-related needs will improve 02/22/2023 1404 by Fraser Din, RN Outcome: Progressing 02/22/2023 0910 by Fraser Din, RN Outcome: Progressing   Problem: Clinical Measurements: Goal: Ability to maintain clinical measurements within normal limits will improve 02/22/2023 1404 by Fraser Din, RN Outcome: Progressing 02/22/2023 0910 by Fraser Din, RN Outcome: Progressing Goal: Will remain free from infection 02/22/2023 1404 by Fraser Din, RN Outcome: Progressing 02/22/2023 0910 by Fraser Din, RN Outcome: Progressing Goal: Diagnostic test results will improve 02/22/2023 1404 by Fraser Din, RN Outcome: Progressing 02/22/2023 0910 by Fraser Din, RN Outcome: Progressing Goal: Respiratory complications will improve 02/22/2023 1404 by Fraser Din, RN Outcome: Progressing 02/22/2023 0910 by Fraser Din, RN Outcome: Progressing Goal: Cardiovascular complication will be avoided 02/22/2023 1404 by Fraser Din, RN Outcome: Progressing 02/22/2023 0910 by Fraser Din, RN Outcome: Progressing   Problem: Activity: Goal: Risk for activity intolerance will decrease 02/22/2023 1404 by Fraser Din, RN Outcome: Progressing 02/22/2023 0910 by Fraser Din, RN Outcome: Progressing   Problem: Nutrition: Goal: Adequate nutrition will be maintained 02/22/2023 1404 by Fraser Din, RN Outcome: Progressing 02/22/2023 0910 by Fraser Din, RN Outcome: Progressing   Problem: Coping: Goal: Level of anxiety will decrease 02/22/2023 1404 by Fraser Din, RN Outcome: Progressing 02/22/2023 0910 by Fraser Din, RN Outcome: Progressing   Problem: Elimination: Goal: Will not experience complications related to bowel motility 02/22/2023 1404 by Fraser Din, RN Outcome: Progressing 02/22/2023 0910 by Fraser Din, RN Outcome: Progressing Goal: Will not experience complications related to urinary retention 02/22/2023 1404 by Fraser Din, RN Outcome: Progressing 02/22/2023 0910 by Fraser Din, RN Outcome: Progressing   Problem: Pain Managment: Goal: General experience of comfort will improve and/or be controlled 02/22/2023 1404 by Fraser Din, RN Outcome: Progressing 02/22/2023 0910 by Fraser Din, RN Outcome: Progressing   Problem: Safety: Goal: Ability to remain free from injury will improve 02/22/2023 1404 by Fraser Din, RN Outcome: Progressing 02/22/2023 0910 by Fraser Din, RN Outcome: Progressing   Problem: Skin Integrity: Goal: Risk for impaired skin integrity will decrease 02/22/2023 1404 by Fraser Din, RN Outcome: Progressing 02/22/2023 0910 by Fraser Din, RN Outcome: Progressing   Problem: Education: Goal: Understanding of discharge needs will improve 02/22/2023 1404 by Fraser Din, RN Outcome: Progressing 02/22/2023 0910 by Fraser Din, RN Outcome: Progressing Goal: Verbalization of understanding of the causes of altered bowel function will improve 02/22/2023 1404 by Fraser Din, RN Outcome: Progressing 02/22/2023 0910 by Fraser Din, RN Outcome: Progressing   Problem: Activity: Goal: Ability to tolerate increased activity will improve 02/22/2023 1404 by Fraser Din, RN Outcome: Progressing 02/22/2023 0910 by Fraser Din, RN Outcome: Progressing   Problem: Bowel/Gastric: Goal: Gastrointestinal status for postoperative course will improve 02/22/2023 1404 by Fraser Din, RN Outcome: Progressing 02/22/2023 0910 by Fraser Din, RN Outcome: Progressing   Problem: Health Behavior/Discharge Planning: Goal: Identification of community resources to assist with postoperative recovery needs will improve 02/22/2023 1404 by Fraser Din, RN Outcome: Progressing 02/22/2023 0910 by Fraser Din, RN Outcome: Progressing   Problem:  Nutritional: Goal: Will attain and maintain optimal nutritional status will improve 02/22/2023 1404 by Fraser Din, RN Outcome: Progressing 02/22/2023 0910 by Fraser Din, RN Outcome: Progressing   Problem: Clinical Measurements: Goal: Postoperative complications will be avoided or minimized 02/22/2023 1404 by Fraser Din, RN Outcome: Progressing 02/22/2023 0910 by Fraser Din, RN Outcome: Progressing   Problem: Respiratory: Goal: Respiratory status will improve 02/22/2023 1404 by Fraser Din, RN Outcome: Progressing 02/22/2023 0910 by Fraser Din, RN Outcome: Progressing   Problem: Skin Integrity: Goal: Will show signs of wound healing 02/22/2023 1404 by Fraser Din, RN Outcome: Progressing 02/22/2023 0910 by Fraser Din, RN Outcome: Progressing   Problem: Education: Goal: Understanding of discharge needs will improve Outcome: Progressing Goal: Verbalization of understanding of the causes of altered bowel function will improve Outcome: Progressing   Problem: Activity: Goal: Ability to tolerate increased activity will improve Outcome: Progressing   Problem: Bowel/Gastric: Goal: Gastrointestinal status for postoperative course will improve Outcome: Progressing   Problem: Health Behavior/Discharge Planning: Goal: Identification of community resources to assist with postoperative recovery needs will improve Outcome: Progressing   Problem: Nutritional: Goal: Will attain and maintain optimal nutritional status will improve Outcome: Progressing   Problem: Clinical Measurements: Goal: Postoperative complications will be  avoided or minimized Outcome: Progressing   Problem: Respiratory: Goal: Respiratory status will improve Outcome: Progressing   Problem: Skin Integrity: Goal: Will show signs of wound healing Outcome: Progressing

## 2023-02-22 NOTE — Transfer of Care (Signed)
Immediate Anesthesia Transfer of Care Note  Patient: Debra Fry  Procedure(s) Performed: LAPAROSCOPIC PARTIAL RIGHT COLECTOMY (Right: Abdomen)  Patient Location: PACU  Anesthesia Type:General  Level of Consciousness: awake and alert   Airway & Oxygen Therapy: Patient Spontanous Breathing and Patient connected to face mask oxygen  Post-op Assessment: Report given to RN and Post -op Vital signs reviewed and stable  Post vital signs: Reviewed and stable  Last Vitals:  Vitals Value Taken Time  BP 183/56 02/22/23 1150  Temp    Pulse 73 02/22/23 1152  Resp 20 02/22/23 1152  SpO2 100 % 02/22/23 1152  Vitals shown include unfiled device data.  Last Pain:  Vitals:   02/22/23 0825  TempSrc: Oral  PainSc: 0-No pain         Complications: No notable events documented.

## 2023-02-22 NOTE — Interval H&P Note (Signed)
History and Physical Interval Note:  02/22/2023 8:51 AM  Debra Fry  has presented today for surgery, with the diagnosis of COLON CANCER.  The various methods of treatment have been discussed with the patient and family. After consideration of risks, benefits and other options for treatment, the patient has consented to  Procedure(s): LAPAROSCOPIC PARTIAL RIGHT COLECTOMY (Right) as a surgical intervention.  The patient's history has been reviewed, patient examined, no change in status, stable for surgery.  I have reviewed the patient's chart and labs.  Questions were answered to the patient's satisfaction.     Anabel Lykins Lollie Sails

## 2023-02-22 NOTE — Progress Notes (Signed)
Patient noted to have a small BM dark in color with bright red blood noted with the stool

## 2023-02-22 NOTE — Plan of Care (Signed)

## 2023-02-22 NOTE — Anesthesia Procedure Notes (Addendum)
Procedure Name: Intubation Date/Time: 02/22/2023 9:54 AM  Performed by: Jamelle Rushing, CRNAPre-anesthesia Checklist: Patient identified, Emergency Drugs available, Suction available, Patient being monitored and Timeout performed Patient Re-evaluated:Patient Re-evaluated prior to induction Oxygen Delivery Method: Circle system utilized Preoxygenation: Pre-oxygenation with 100% oxygen Induction Type: IV induction Ventilation: Mask ventilation without difficulty Laryngoscope Size: Mac and 3 Grade View: Grade II Tube type: Oral Tube size: 7.0 mm Number of attempts: 1 Airway Equipment and Method: Stylet Placement Confirmation: ETT inserted through vocal cords under direct vision and breath sounds checked- equal and bilateral Secured at: 20 cm Tube secured with: Tape Dental Injury: Teeth and Oropharynx as per pre-operative assessment

## 2023-02-22 NOTE — Anesthesia Postprocedure Evaluation (Signed)
Anesthesia Post Note  Patient: Debra Fry  Procedure(s) Performed: LAPAROSCOPIC PARTIAL RIGHT COLECTOMY (Right: Abdomen)     Patient location during evaluation: PACU Anesthesia Type: General Level of consciousness: awake and alert Pain management: pain level controlled Vital Signs Assessment: post-procedure vital signs reviewed and stable Respiratory status: spontaneous breathing, nonlabored ventilation, respiratory function stable and patient connected to nasal cannula oxygen Cardiovascular status: blood pressure returned to baseline and stable Postop Assessment: no apparent nausea or vomiting Anesthetic complications: no  No notable events documented.  Last Vitals:  Vitals:   02/22/23 1315 02/22/23 1343  BP: (!) 149/39 (!) 146/72  Pulse: 70 72  Resp: 10   Temp:  36.8 C  SpO2: 97% 96%    Last Pain:  Vitals:   02/22/23 1354  TempSrc:   PainSc: 2                  Shelton Silvas

## 2023-02-22 NOTE — Anesthesia Preprocedure Evaluation (Addendum)
Anesthesia Evaluation  Patient identified by MRN, date of birth, ID band Patient awake    Reviewed: Allergy & Precautions, NPO status , Patient's Chart, lab work & pertinent test results  Airway Mallampati: II  TM Distance: >3 FB Neck ROM: Full    Dental  (+) Teeth Intact, Dental Advisory Given, Partial Lower, Partial Upper   Pulmonary neg pulmonary ROS   breath sounds clear to auscultation       Cardiovascular + CAD, + Past MI and + CABG   Rhythm:Regular Rate:Normal  Echo:  - Left ventricle: The cavity size was normal. Wall thickness was    increased in a pattern of mild LVH. Systolic function was normal.    The estimated ejection fraction was in the range of 55% to 60%.    Wall motion was normal; there were no regional wall motion    abnormalities.  - Mitral valve: Calcified annulus. There was mild regurgitation.      Neuro/Psych  Headaches PSYCHIATRIC DISORDERS       Neuromuscular disease CVA    GI/Hepatic Neg liver ROS, hiatal hernia,GERD  Medicated,,  Endo/Other  diabetesHypothyroidism    Renal/GU negative Renal ROS     Musculoskeletal  (+) Arthritis ,    Abdominal   Peds  Hematology  (+) Blood dyscrasia, anemia   Anesthesia Other Findings   Reproductive/Obstetrics                             Anesthesia Physical Anesthesia Plan  ASA: 3  Anesthesia Plan: General   Post-op Pain Management: Tylenol PO (pre-op)*   Induction: Intravenous  PONV Risk Score and Plan: 4 or greater and Ondansetron, Treatment may vary due to age or medical condition and Dexamethasone  Airway Management Planned: Oral ETT  Additional Equipment: None  Intra-op Plan:   Post-operative Plan: Extubation in OR  Informed Consent: I have reviewed the patients History and Physical, chart, labs and discussed the procedure including the risks, benefits and alternatives for the proposed anesthesia with the  patient or authorized representative who has indicated his/her understanding and acceptance.     Dental advisory given  Plan Discussed with: CRNA  Anesthesia Plan Comments:        Anesthesia Quick Evaluation

## 2023-02-22 NOTE — Progress Notes (Incomplete)
Regarding previous progress note from day shift RN, patient denies having any bowel movements post-op so far.

## 2023-02-23 ENCOUNTER — Encounter (HOSPITAL_COMMUNITY): Payer: Self-pay | Admitting: Surgery

## 2023-02-23 DIAGNOSIS — D649 Anemia, unspecified: Secondary | ICD-10-CM | POA: Diagnosis not present

## 2023-02-23 LAB — TSH: TSH: 1.847 u[IU]/mL (ref 0.350–4.500)

## 2023-02-23 MED ORDER — GUAIFENESIN 100 MG/5ML PO LIQD
5.0000 mL | ORAL | Status: DC | PRN
  Administered 2023-02-23 – 2023-02-26 (×6): 5 mL via ORAL
  Filled 2023-02-23 (×6): qty 10

## 2023-02-23 MED ORDER — DOCUSATE SODIUM 100 MG PO CAPS
100.0000 mg | ORAL_CAPSULE | Freq: Two times a day (BID) | ORAL | Status: DC
  Administered 2023-02-23 – 2023-02-24 (×4): 100 mg via ORAL
  Filled 2023-02-23 (×6): qty 1

## 2023-02-23 MED ORDER — PANTOPRAZOLE SODIUM 40 MG PO TBEC
40.0000 mg | DELAYED_RELEASE_TABLET | Freq: Two times a day (BID) | ORAL | Status: DC
  Administered 2023-02-23 – 2023-02-27 (×8): 40 mg via ORAL
  Filled 2023-02-23 (×8): qty 1

## 2023-02-23 NOTE — Plan of Care (Signed)
  Problem: Education: Goal: Knowledge of General Education information will improve Description: Including pain rating scale, medication(s)/side effects and non-pharmacologic comfort measures Outcome: Progressing   Problem: Clinical Measurements: Goal: Will remain free from infection Outcome: Progressing Goal: Diagnostic test results will improve Outcome: Progressing Goal: Respiratory complications will improve Outcome: Progressing Goal: Cardiovascular complication will be avoided Outcome: Progressing   Problem: Activity: Goal: Risk for activity intolerance will decrease Outcome: Progressing   Problem: Nutrition: Goal: Adequate nutrition will be maintained Outcome: Progressing   Problem: Coping: Goal: Level of anxiety will decrease Outcome: Progressing   Problem: Elimination: Goal: Will not experience complications related to urinary retention Outcome: Progressing   Problem: Pain Managment: Goal: General experience of comfort will improve and/or be controlled Outcome: Progressing   Problem: Safety: Goal: Ability to remain free from injury will improve Outcome: Progressing   Problem: Skin Integrity: Goal: Risk for impaired skin integrity will decrease Outcome: Progressing   Problem: Education: Goal: Verbalization of understanding of the causes of altered bowel function will improve Outcome: Progressing   Problem: Activity: Goal: Ability to tolerate increased activity will improve Outcome: Progressing   Problem: Nutritional: Goal: Will attain and maintain optimal nutritional status will improve Outcome: Progressing   Problem: Clinical Measurements: Goal: Postoperative complications will be avoided or minimized Outcome: Progressing   Problem: Respiratory: Goal: Respiratory status will improve Outcome: Progressing   Problem: Skin Integrity: Goal: Will show signs of wound healing Outcome: Progressing   Problem: Activity: Goal: Ability to tolerate  increased activity will improve Outcome: Progressing

## 2023-02-23 NOTE — Progress Notes (Signed)
1 Day Post-Op   Subjective/Chief Complaint: No complaints, pain well controlled No flatus yet. No nausea with clears   Objective: Vital signs in last 24 hours: Temp:  [97.5 F (36.4 C)-98.4 F (36.9 C)] 98.2 F (36.8 C) (01/29 0517) Pulse Rate:  [70-80] 72 (01/29 0517) Resp:  [10-22] 18 (01/29 0517) BP: (124-183)/(39-78) 138/45 (01/29 0517) SpO2:  [91 %-100 %] 100 % (01/29 0517) Last BM Date : 02/21/23  Intake/Output from previous day: 01/28 0701 - 01/29 0700 In: 1588.6 [I.V.:1488.6; IV Piggyback:100] Out: 1040 [Urine:1000; Blood:40] Intake/Output this shift: Total I/O In: -  Out: 250 [Urine:250]  Abd s/nontender, mildly distended. Incisions c/d/I no cellulitis or hematoma  Lab Results:  Recent Labs    02/22/23 0524  WBC 2.7*  HGB 9.2*  HCT 30.4*  PLT 281   BMET Recent Labs    02/22/23 0524  NA 132*  K 3.6  CL 100  CO2 22  GLUCOSE 117*  BUN 9  CREATININE 0.86  CALCIUM 8.6*   PT/INR No results for input(s): "LABPROT", "INR" in the last 72 hours. ABG No results for input(s): "PHART", "HCO3" in the last 72 hours.  Invalid input(s): "PCO2", "PO2"  Studies/Results: No results found.   Anti-infectives: Anti-infectives (From admission, onward)    Start     Dose/Rate Route Frequency Ordered Stop   02/22/23 0600  cefoTEtan (CEFOTAN) 2 g in sodium chloride 0.9 % 100 mL IVPB        2 g 200 mL/hr over 30 Minutes Intravenous On call to O.R. 02/21/23 4696 02/22/23 0940   02/21/23 1400  neomycin (MYCIFRADIN) tablet 1,000 mg       Placed in "And" Linked Group   1,000 mg Oral 3 times per day 02/21/23 0851 02/21/23 2131   02/21/23 1400  metroNIDAZOLE (FLAGYL) tablet 1,000 mg       Placed in "And" Linked Group   1,000 mg Oral 3 times per day 02/21/23 0851 02/21/23 2130       Assessment/Plan: Right colon mass w bleeding s/p laparoscopic right hemicolectomy 1/28. Doing well. Stop IVF Advance to fulls Await bowel function Mobilize    LOS: 7 days     Berna Bue 02/23/2023

## 2023-02-23 NOTE — Progress Notes (Signed)
Pt has NOT had a BM since surgery on 1/28. Previous note was documented in error.

## 2023-02-23 NOTE — Progress Notes (Signed)
[facility] PROGRESS NOTE    Progress Note  Debra Fry  ZOX:[id number] DOB: 12-31-1940 DOA: 02/16/2023 PCP: Patient, No Pcp Per     Brief Narrative:   Debra Fry is an 83 y.o. 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 General surgery was consulted who performed right colectomy on 02/22/2023. CT scan of the abdomen pelvis showed no mets. CEA was 0.8. Further management per general surgery.  Recommend to stop IV fluids diet advance to full. Recommended to continue monitor bowel function.  Out of bed to chair consult physical therapy.  Diabetes mellitus type 2: With a last A1c of 5.8. Blood glucose well-controlled on diet.  History of ischemic cardiomyopathy: With a last EF of 55%, Seen by cardiology they had no further workup prior to surgery.  History of CVA: Noted, will need aspirin once okay with surgery.  Familial hyperlipidemia Will need to be on a statin once able to take orals.  Vitamin D B12 deficiency: Continue supplementation.  Anxiety: Continue carbamazepine.  DVT prophylaxis: lovenox Family Communication:none Status is: Inpatient Remains inpatient appropriate because: Acute blood loss anemia due to colonic mass    Code  Status:     Code Status Orders  (From admission, onward)           Start     Ordered   02/16/23 1846  Full code  Continuous       Question:  By:  Answer:  Consent: discussion documented in EHR   02/16/23 1847           Code Status History     Date Active Date Inactive Code Status Order ID Comments User Context   01/22/2023 1143 01/23/2023 1927 Full Code [id number]  Bobette Mo, MD Inpatient   02/13/2018 1702 02/14/2018 1742 Full Code [id number]  Sherren Kerns, MD Inpatient   01/27/2018 2316 01/29/2018 2206 Full Code [id number]  John Giovanni, MD Inpatient         IV Access:   Peripheral IV   Procedures and diagnostic studies:   No results found.   Medical Consultants:   None.   Subjective:    Debra Fry to rated her diet feels good denies any abdominal pain has had a bowel movement.  Objective:    Vitals:   02/22/23 1806 02/22/23 2106 02/23/23 0210 02/23/23 0517  BP: (!) 124/50 (!) 146/54 (!) 141/46 (!) 138/45  Pulse: 77 74 70 72  Resp: 16 18 18 18   Temp: (!) 97.5 F (36.4 C) 97.7 F (36.5 C) 97.6 F (36.4 C) 98.2 F (36.8 C)  TempSrc: Oral Oral Oral Oral  SpO2: 99% 100%  100%  Weight:      Height:       SpO2: 100 % O2 Flow Rate (L/min): 2 L/min   Intake/Output Summary (Last 24 hours)  at 02/23/2023 1056 Last data filed at 02/23/2023 0904 Gross per 24 hour  Intake 1588.58 ml  Output 1290 ml  Net 298.58 ml   Filed Weights   02/16/23 1418  Weight: 54.4 kg    Exam: General exam: In no acute distress. Respiratory system: Good air movement and clear to auscultation. Cardiovascular system: S1 & S2 heard, RRR. No JVD. Gastrointestinal system: Abdomen is nondistended, soft and nontender.  Extremities: No pedal edema. Skin: No rashes, lesions or ulcers Psychiatry: Judgement and insight appear normal. Mood & affect appropriate.    Data Reviewed:    Labs: Basic Metabolic Panel: Recent Labs  Lab 02/16/23 1537  02/17/23 0525 02/18/23 0454 02/19/23 0723 02/22/23 0524  NA 135 136 133* 136 132*  K 4.0 3.8 3.7 3.9 3.6  CL 102 104 101 100 100  CO2 24 25 21* 25 22  GLUCOSE 115* 102* 87 93 117*  BUN 17 15 16 13 9   CREATININE 0.51 0.39* 0.64 0.77 0.86  CALCIUM 9.3 9.1 8.8* 9.0 8.6*   GFR Estimated Creatinine Clearance: 41.3 mL/min (by C-G formula based on SCr of 0.86 mg/dL). Liver Function Tests: Recent Labs  Lab 02/16/23 1537 02/17/23 0525 02/18/23 0454 02/19/23 0723  AST 18 17 17 19   ALT 10 12 11 14   ALKPHOS 67 57 57 63  BILITOT 0.8 1.1 1.5* 1.4*  PROT 7.9 7.3 6.9 7.5  ALBUMIN 3.6 3.2* 3.0* 3.2*   No results for input(s): "LIPASE", "AMYLASE" in the last 168 hours. No results for input(s): "AMMONIA" in the last 168 hours. Coagulation profile No results for input(s): "INR", "PROTIME" in the last 168 hours. COVID-19 Labs  No results for input(s): "DDIMER", "FERRITIN", "LDH", "CRP" in the last 72 hours.  Lab Results  Component Value Date   SARSCOV2NAA NOT DETECTED 11/20/2018    CBC: Recent Labs  Lab 02/17/23 1514 02/18/23 0045 02/18/23 0454 02/19/23 0723 02/22/23 0524  WBC 7.4 9.2 8.7 7.7 2.7*  NEUTROABS  --   --   --   --  1.5*  HGB 7.7* 8.5* 8.3* 9.1* 9.2*  HCT 26.6* 27.9* 26.9* 29.8* 30.4*  MCV 71.3* 75.0* 73.3* 73.6* 75.4*  PLT 305 272 265 306 281   Cardiac Enzymes: No results for input(s): "CKTOTAL", "CKMB", "CKMBINDEX", "TROPONINI" in the last 168 hours. BNP (last 3 results) No results for input(s): "PROBNP" in the last 8760 hours. CBG: No results for input(s): "GLUCAP" in the last 168 hours. D-Dimer: No results for input(s): "DDIMER" in the last 72 hours. Hgb A1c: No results for input(s): "HGBA1C" in the last 72 hours. Lipid Profile: No results for input(s): "CHOL", "HDL", "LDLCALC", "TRIG", "CHOLHDL", "LDLDIRECT" in the last 72 hours. Thyroid function studies: Recent Labs    02/23/23 0552  TSH 1.847   Anemia work up: No results for input(s):  "VITAMINB12", "FOLATE", "FERRITIN", "TIBC", "IRON", "RETICCTPCT" in the last 72 hours. Sepsis Labs: Recent Labs  Lab 02/18/23 0045 02/18/23 0454 02/19/23 0723 02/22/23 0524  WBC 9.2 8.7 7.7 2.7*   Microbiology No results found for this or any previous visit (from the past 240 hours).   Medications:    acetaminophen  1,000 mg Oral Q6H   vitamin D3  2,000 Units Oral Daily   cyanocobalamin  1,000 mcg Oral Daily   docusate sodium  100 mg Oral BID   enoxaparin (LOVENOX) injection  40 mg Subcutaneous Q24H   feeding supplement  1 Container Oral TID BM   pantoprazole (PROTONIX) IV  40 mg Intravenous Q12H  Vitamin D (Ergocalciferol)  50,000 Units Oral Q7 days   Continuous Infusions:    LOS: 7 days   Marinda Elk  [facility]  02/23/2023, 10:56 AM

## 2023-02-23 NOTE — Evaluation (Signed)
Physical Therapy Brief Evaluation and Discharge Note Patient Details Name: Debra Fry MRN: [id number] DOB: 1940-01-27 Today's Date: 02/23/2023   History of Present Illness  83 yo female admitted with anemia. S/P R hemicolectomy 02/22/23. Hx of CAD, CVA, MI, R colon mass, CVA, DM, cardiomyopathy  Clinical Impression  Independent. Pt up mobilizing in room ad lib. 1x eval.        PT Assessment    Assistance Needed at Discharge       Equipment Recommendations None recommended by PT  Recommendations for Other Services       Precautions/Restrictions Precautions Precautions: None Restrictions Weight Bearing Restrictions Per Provider Order: No        Mobility  Bed Mobility          Transfers                   General transfer comment: pt oob mobilizing    Ambulation/Gait Ambulation/Gait assistance: Independent Gait Distance (Feet): 50 Feet       General Gait Details: walked in room. pt up and about gathering items for transfer to different room. able to reach and unplug personal charge from wall outlet  Home Activity Instructions    Stairs            Modified Rankin (Stroke Patients Only)        Balance                          Pertinent Vitals/Pain   Pain Assessment Pain Assessment: Faces Faces Pain Scale: Hurts a little bit Pain Location: abdomen Pain Descriptors / Indicators: Sore Pain Intervention(s): Monitored during session     Home Living   Living Arrangements: Children       Home Equipment: None        Prior Function        UE/LE Assessment               Communication   Communication Communication: No apparent difficulties     Cognition         General Comments      Exercises     Assessment/Plan    PT Problem List         PT Visit Diagnosis      No Skilled PT Patient is independent with all acitivity/mobility   Co-evaluation                AMPAC 6 Clicks Help  needed turning from your back to your side while in a flat bed without using bedrails?: None Help needed moving from lying on your back to sitting on the side of a flat bed without using bedrails?: None Help needed moving to and from a bed to a chair (including a wheelchair)?: None Help needed standing up from a chair using your arms (e.g., wheelchair or bedside chair)?: None Help needed to walk in hospital room?: None Help needed climbing 3-5 steps with a railing? : None 6 Click Score: 24      End of Session   Activity Tolerance: Patient tolerated treatment well           Time: 7829-5621 PT Time Calculation (min) (ACUTE ONLY): 8 min  Charges:   PT Evaluation $PT Eval Low Complexity: 1 Low          Faye Ramsay, PT Acute Rehabilitation  Office: [phone]

## 2023-02-24 ENCOUNTER — Other Ambulatory Visit: Payer: Self-pay | Admitting: *Deleted

## 2023-02-24 ENCOUNTER — Encounter: Payer: Self-pay | Admitting: *Deleted

## 2023-02-24 DIAGNOSIS — C182 Malignant neoplasm of ascending colon: Secondary | ICD-10-CM

## 2023-02-24 DIAGNOSIS — D649 Anemia, unspecified: Secondary | ICD-10-CM | POA: Diagnosis not present

## 2023-02-24 LAB — BASIC METABOLIC PANEL
Anion gap: 7 (ref 5–15)
BUN: 6 mg/dL — ABNORMAL LOW (ref 8–23)
CO2: 23 mmol/L (ref 22–32)
Calcium: 8.4 mg/dL — ABNORMAL LOW (ref 8.9–10.3)
Chloride: 105 mmol/L (ref 98–111)
Creatinine, Ser: 0.57 mg/dL (ref 0.44–1.00)
GFR, Estimated: >60 mL/min (ref >60)
Glucose, Bld: 93 mg/dL (ref 70–99)
Potassium: 3.1 mmol/L — ABNORMAL LOW (ref 3.5–5.1)
Sodium: 135 mmol/L (ref 135–145)

## 2023-02-24 LAB — MAGNESIUM: Magnesium: 1.5 mg/dL — ABNORMAL LOW (ref 1.7–2.4)

## 2023-02-24 LAB — CBC
HCT: 26.4 % — ABNORMAL LOW (ref 36.0–46.0)
Hemoglobin: 7.9 g/dL — ABNORMAL LOW (ref 12.0–15.0)
MCH: 22.4 pg — ABNORMAL LOW (ref 26.0–34.0)
MCHC: 29.9 g/dL — ABNORMAL LOW (ref 30.0–36.0)
MCV: 74.8 fL — ABNORMAL LOW (ref 80.0–100.0)
Platelets: 240 10*3/uL (ref 150–400)
RBC: 3.53 MIL/uL — ABNORMAL LOW (ref 3.87–5.11)
RDW: 24.3 % — ABNORMAL HIGH (ref 11.5–15.5)
WBC: 7.8 10*3/uL (ref 4.0–10.5)
nRBC: 0 % (ref 0.0–0.2)

## 2023-02-24 MED ORDER — POTASSIUM CHLORIDE CRYS ER 20 MEQ PO TBCR
40.0000 meq | EXTENDED_RELEASE_TABLET | Freq: Two times a day (BID) | ORAL | Status: AC
Start: 2023-02-24 — End: 2023-02-24
  Administered 2023-02-24 (×2): 40 meq via ORAL
  Filled 2023-02-24 (×2): qty 2

## 2023-02-24 MED ORDER — MAGNESIUM SULFATE 4 GM/100ML IV SOLN
4.0000 g | Freq: Once | INTRAVENOUS | Status: AC
  Administered 2023-02-24: 4 g via INTRAVENOUS
  Filled 2023-02-24: qty 100

## 2023-02-24 MED ORDER — POTASSIUM CHLORIDE 10 MEQ/100ML IV SOLN: 10.0000 meq | INTRAVENOUS | Status: DC

## 2023-02-24 MED ORDER — POTASSIUM CHLORIDE 20 MEQ PO PACK
40.0000 meq | PACK | Freq: Once | ORAL | Status: AC
  Administered 2023-02-24: 40 meq via ORAL
  Filled 2023-02-24: qty 2

## 2023-02-24 NOTE — Plan of Care (Signed)
  Problem: Education: Goal: Knowledge of General Education information will improve Description: Including pain rating scale, medication(s)/side effects and non-pharmacologic comfort measures Outcome: Progressing   Problem: Health Behavior/Discharge Planning: Goal: Ability to manage health-related needs will improve Outcome: Progressing   Problem: Clinical Measurements: Goal: Ability to maintain clinical measurements within normal limits will improve Outcome: Progressing Goal: Will remain free from infection Outcome: Progressing Goal: Diagnostic test results will improve Outcome: Progressing Goal: Respiratory complications will improve Outcome: Progressing Goal: Cardiovascular complication will be avoided Outcome: Progressing   Problem: Activity: Goal: Risk for activity intolerance will decrease Outcome: Progressing   Problem: Nutrition: Goal: Adequate nutrition will be maintained Outcome: Progressing   Problem: Coping: Goal: Level of anxiety will decrease Outcome: Progressing   Problem: Elimination: Goal: Will not experience complications related to bowel motility Outcome: Progressing Goal: Will not experience complications related to urinary retention Outcome: Progressing   Problem: Pain Managment: Goal: General experience of comfort will improve and/or be controlled Outcome: Progressing   Problem: Safety: Goal: Ability to remain free from injury will improve Outcome: Progressing   Problem: Skin Integrity: Goal: Risk for impaired skin integrity will decrease Outcome: Progressing   Problem: Education: Goal: Understanding of discharge needs will improve Outcome: Progressing Goal: Verbalization of understanding of the causes of altered bowel function will improve Outcome: Progressing   Problem: Activity: Goal: Ability to tolerate increased activity will improve Outcome: Progressing   Problem: Bowel/Gastric: Goal: Gastrointestinal status for postoperative  course will improve Outcome: Progressing   Problem: Health Behavior/Discharge Planning: Goal: Identification of community resources to assist with postoperative recovery needs will improve Outcome: Progressing   Problem: Nutritional: Goal: Will attain and maintain optimal nutritional status will improve Outcome: Progressing   Problem: Clinical Measurements: Goal: Postoperative complications will be avoided or minimized Outcome: Progressing   Problem: Respiratory: Goal: Respiratory status will improve Outcome: Progressing   Problem: Skin Integrity: Goal: Will show signs of wound healing Outcome: Progressing   Problem: Education: Goal: Understanding of discharge needs will improve Outcome: Progressing Goal: Verbalization of understanding of the causes of altered bowel function will improve Outcome: Progressing   Problem: Activity: Goal: Ability to tolerate increased activity will improve Outcome: Progressing   Problem: Bowel/Gastric: Goal: Gastrointestinal status for postoperative course will improve Outcome: Progressing   Problem: Health Behavior/Discharge Planning: Goal: Identification of community resources to assist with postoperative recovery needs will improve Outcome: Progressing   Problem: Nutritional: Goal: Will attain and maintain optimal nutritional status will improve Outcome: Progressing   Problem: Clinical Measurements: Goal: Postoperative complications will be avoided or minimized Outcome: Progressing   Problem: Respiratory: Goal: Respiratory status will improve Outcome: Progressing   Problem: Skin Integrity: Goal: Will show signs of wound healing Outcome: Progressing

## 2023-02-24 NOTE — Progress Notes (Signed)
[facility] PROGRESS NOTE    Progress Note  Debra Fry  ZOX:[id number] DOB: [birth year] DOA: 02/16/2023 PCP: Patient, No Pcp Per     Brief Narrative:   Debra Fry is an 83 y.o. 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 General surgery was consulted who performed right colectomy on 02/22/2023. CT scan of the abdomen pelvis showed no mets. CEA was 0.8. Further management per general surgery.   Advance diet per surgery, having flatus, no bowel movements. Recommended to continue monitor bowel function.   Out of bed to chair consult physical therapy.  Diabetes mellitus type 2: With a last A1c of 5.8. Blood glucose well-controlled on diet.  Hypokalemia: Replete orally recheck in the morning. Acute potassium greater than 4 magnesium greater than 2.  History of ischemic cardiomyopathy: With a last EF of 55%, Seen by cardiology they had no further workup prior to surgery.  History of CVA: Noted, will need aspirin once okay with surgery.  Familial hyperlipidemia Will need to be on a statin once able to take orals.  Vitamin D B12 deficiency: Continue supplementation.  Anxiety: Continue carbamazepine.  DVT prophylaxis: lovenox Family  Communication:none Status is: Inpatient Remains inpatient appropriate because: Acute blood loss anemia due to colonic mass    Code Status:     Code Status Orders  (From admission, onward)           Start     Ordered   02/16/23 1846  Full code  Continuous       Question:  By:  Answer:  Consent: discussion documented in EHR   02/16/23 1847           Code Status History     Date Active Date Inactive Code Status Order ID Comments User Context   01/22/2023 1143 01/23/2023 1927 Full Code [id number]  Bobette Mo, MD Inpatient   02/13/2018 1702 02/14/2018 1742 Full Code [id number]  Sherren Kerns, MD Inpatient   01/27/2018 2316 01/29/2018 2206 Full Code [id number]  John Giovanni, MD Inpatient         IV Access:   Peripheral IV   Procedures and diagnostic studies:   No results found.   Medical Consultants:   None.   Subjective:    Debra Fry passing gas has not had a bowel movement tolerating her diet.  Objective:    Vitals:   02/23/23 1522 02/23/23 1940 02/24/23 0522 02/24/23 0611  BP: (!) 135/57 (!) 176/65 (!) 185/69 136/62  Pulse: 64 68 74 71  Resp: 18 19 19    Temp: (!) 97.5 F (36.4 C) 98.1 F (36.7 C) 98.2 F (36.8 C)   TempSrc:      SpO2: 100% 98% 98%   Weight:      Height:       SpO2: 98 % O2 Flow  Rate (L/min): 2 L/min   Intake/Output Summary (Last 24 hours) at 02/24/2023 1033 Last data filed at 02/23/2023 2025 Gross per 24 hour  Intake 720 ml  Output --  Net 720 ml   Filed Weights   02/16/23 1418  Weight: 54.4 kg    Exam: General exam: In no acute distress. Respiratory system: Good air movement and clear to auscultation. Cardiovascular system: S1 & S2 heard, RRR. No JVD. Gastrointestinal system: Abdomen is nondistended, soft and nontender.  Extremities: No pedal edema. Skin: No rashes, lesions or ulcers Psychiatry: Judgement and insight appear normal. Mood & affect appropriate.  Data Reviewed:     Labs: Basic Metabolic Panel: Recent Labs  Lab 02/18/23 0454 02/19/23 0723 02/22/23 0524 02/24/23 0447  NA 133* 136 132* 135  K 3.7 3.9 3.6 3.1*  CL 101 100 100 105  CO2 21* 25 22 23   GLUCOSE 87 93 117* 93  BUN 16 13 9  6*  CREATININE 0.64 0.77 0.86 0.57  CALCIUM 8.8* 9.0 8.6* 8.4*  MG  --   --   --  1.5*   GFR Estimated Creatinine Clearance: 44.4 mL/min (by C-G formula based on SCr of 0.57 mg/dL). Liver Function Tests: Recent Labs  Lab 02/18/23 0454 02/19/23 0723  AST 17 19  ALT 11 14  ALKPHOS 57 63  BILITOT 1.5* 1.4*  PROT 6.9 7.5  ALBUMIN 3.0* 3.2*   No results for input(s): "LIPASE", "AMYLASE" in the last 168 hours. No results for input(s): "AMMONIA" in the last 168 hours. Coagulation profile No results for input(s): "INR", "PROTIME" in the last 168 hours. COVID-19 Labs  No results for input(s): "DDIMER", "FERRITIN", "LDH", "CRP" in the last 72 hours.  Lab Results  Component Value Date   SARSCOV2NAA NOT DETECTED 11/20/2018    CBC: Recent Labs  Lab 02/18/23 0045 02/18/23 0454 02/19/23 0723 02/22/23 0524 02/24/23 0447  WBC 9.2 8.7 7.7 2.7* 7.8  NEUTROABS  --   --   --  1.5*  --   HGB 8.5* 8.3* 9.1* 9.2* 7.9*  HCT 27.9* 26.9* 29.8* 30.4* 26.4*  MCV 75.0* 73.3* 73.6* 75.4* 74.8*  PLT 272 265 306 281 240   Cardiac Enzymes: No results for input(s): "CKTOTAL", "CKMB", "CKMBINDEX", "TROPONINI" in the last 168 hours. BNP (last 3 results) No results for input(s): "PROBNP" in the last 8760 hours. CBG: No results for input(s): "GLUCAP" in the last 168 hours. D-Dimer: No results for input(s): "DDIMER" in the last 72 hours. Hgb A1c: No results for input(s): "HGBA1C" in the last 72 hours. Lipid Profile: No results for input(s): "CHOL", "HDL", "LDLCALC", "TRIG", "CHOLHDL", "LDLDIRECT" in the last 72 hours. Thyroid function studies: Recent Labs    02/23/23 0552  TSH 1.847   Anemia work up: No results for input(s): "VITAMINB12", "FOLATE",  "FERRITIN", "TIBC", "IRON", "RETICCTPCT" in the last 72 hours. Sepsis Labs: Recent Labs  Lab 02/18/23 0454 02/19/23 0723 02/22/23 0524 02/24/23 0447  WBC 8.7 7.7 2.7* 7.8   Microbiology No results found for this or any previous visit (from the past 240 hours).   Medications:    acetaminophen  1,000 mg Oral Q6H   vitamin D3  2,000 Units Oral Daily   cyanocobalamin  1,000 mcg Oral Daily   docusate sodium  100 mg Oral BID   enoxaparin (LOVENOX) injection  40 mg Subcutaneous Q24H   feeding supplement  1 Container Oral TID BM   pantoprazole  40 mg Oral BID   Vitamin D (Ergocalciferol)  50,000 Units Oral  Q7 days   Continuous Infusions:  magnesium sulfate bolus IVPB 4 g (02/24/23 0921)   potassium chloride        LOS: 8 days   Marinda Elk  [facility]  02/24/2023, 10:33 AM

## 2023-02-24 NOTE — Plan of Care (Signed)
  Problem: Education: Goal: Knowledge of General Education information will improve Description: Including pain rating scale, medication(s)/side effects and non-pharmacologic comfort measures Outcome: Progressing   Problem: Clinical Measurements: Goal: Will remain free from infection Outcome: Progressing Goal: Diagnostic test results will improve Outcome: Progressing Goal: Respiratory complications will improve Outcome: Progressing Goal: Cardiovascular complication will be avoided Outcome: Progressing   Problem: Activity: Goal: Risk for activity intolerance will decrease Outcome: Progressing   Problem: Nutrition: Goal: Adequate nutrition will be maintained Outcome: Progressing   Problem: Elimination: Goal: Will not experience complications related to urinary retention Outcome: Progressing   Problem: Pain Managment: Goal: General experience of comfort will improve and/or be controlled Outcome: Progressing   Problem: Safety: Goal: Ability to remain free from injury will improve Outcome: Progressing   Problem: Skin Integrity: Goal: Risk for impaired skin integrity will decrease Outcome: Progressing   Problem: Education: Goal: Verbalization of understanding of the causes of altered bowel function will improve Outcome: Progressing   Problem: Activity: Goal: Ability to tolerate increased activity will improve Outcome: Progressing   Problem: Bowel/Gastric: Goal: Gastrointestinal status for postoperative course will improve Outcome: Progressing   Problem: Nutritional: Goal: Will attain and maintain optimal nutritional status will improve Outcome: Progressing   Problem: Clinical Measurements: Goal: Postoperative complications will be avoided or minimized Outcome: Progressing   Problem: Respiratory: Goal: Respiratory status will improve Outcome: Progressing   Problem: Skin Integrity: Goal: Will show signs of wound healing Outcome: Progressing

## 2023-02-24 NOTE — Progress Notes (Addendum)
2 Days Post-Op   Subjective/Chief Complaint: No complaints, pain well controlled.  + flatus, no BM.  Ambulating well.  Tolerating FLD with no issues.   Objective: Vital signs in last 24 hours: Temp:  [97.5 F (36.4 C)-98.2 F (36.8 C)] 98.2 F (36.8 C) (01/30 0522) Pulse Rate:  [64-74] 71 (01/30 0611) Resp:  [16-19] 19 (01/30 0522) BP: (126-185)/(49-69) 136/62 (01/30 0611) SpO2:  [98 %-100 %] 98 % (01/30 0522) Last BM Date : 02/21/23  Intake/Output from previous day: 01/29 0701 - 01/30 0700 In: 720 [P.O.:720] Out: 250 [Urine:250] Intake/Output this shift: No intake/output data recorded.  Abd soft, minimally tender, but appropriate post op, ND. Incisions c/d/I no cellulitis or hematoma  Lab Results:  Recent Labs    02/22/23 0524 02/24/23 0447  WBC 2.7* 7.8  HGB 9.2* 7.9*  HCT 30.4* 26.4*  PLT 281 240   BMET Recent Labs    02/22/23 0524 02/24/23 0447  NA 132* 135  K 3.6 3.1*  CL 100 105  CO2 22 23  GLUCOSE 117* 93  BUN 9 6*  CREATININE 0.86 0.57  CALCIUM 8.6* 8.4*   PT/INR No results for input(s): "LABPROT", "INR" in the last 72 hours. ABG No results for input(s): "PHART", "HCO3" in the last 72 hours.  Invalid input(s): "PCO2", "PO2"  Studies/Results: No results found.   Anti-infectives: Anti-infectives (From admission, onward)    Start     Dose/Rate Route Frequency Ordered Stop   02/22/23 0600  cefoTEtan (CEFOTAN) 2 g in sodium chloride 0.9 % 100 mL IVPB        2 g 200 mL/hr over 30 Minutes Intravenous On call to O.R. 02/21/23 7829 02/22/23 0940   02/21/23 1400  neomycin (MYCIFRADIN) tablet 1,000 mg       Placed in "And" Linked Group   1,000 mg Oral 3 times per day 02/21/23 0851 02/21/23 2131   02/21/23 1400  metroNIDAZOLE (FLAGYL) tablet 1,000 mg       Placed in "And" Linked Group   1,000 mg Oral 3 times per day 02/21/23 0851 02/21/23 2130       Assessment/Plan: POD 2, Right colon mass w bleeding s/p laparoscopic right hemicolectomy  1/28, Dr. Fredricka Bonine.  -Doing well. -Stop IVF, yesterday -cont fulls, but may be able to adv to soft -Await more bowel function -Mobilize -replace K today -hgb 7.9 from 9.  Cont to monitor.  No evidence of acute bleeding at this time.  FEN - FLD VTE - Lovenox ID - none currently needed   LOS: 8 days    Letha Cape 02/24/2023

## 2023-02-24 NOTE — Progress Notes (Signed)
Referral for medical oncology entered

## 2023-02-24 NOTE — Progress Notes (Signed)
Oncology Discharge Planning Note  [facility] Address: [address], [place], [place] [address] Hours of Operation:  Lewayne Bunting, Monday - Friday  Clinic Contact Information:  [phone]  Oncology Care Team: Medical Oncologist:  Truett Perna  Patient Details: Name:  Debra Fry, Debra Fry MRN:   [id number] DOB:   [birth year] Reason for Current Admission: Colon cancer  Discharge Planning Narrative: Notification of admission received by inpatient team for [facility].  Discharge follow-up appointments for oncology are current and available on the AVS and MyChart.   Upon discharge from the hospital, hematology/oncology's post discharge plan of care for the outpatient setting is:  February 13,2025 at 1:40 pm  [facility] [address] [place], [place] [address]  [phone]   Debra Fry will be called within two business days after discharge to review hematology/oncology's plan of care for full understanding.    Outpatient Oncology Specific Care Only: Oncology appointment transportation needs addressed?:  no Oncology medication management for symptom management addressed?:  not applicable Chemo Alert Card reviewed?:  not applicable Immunotherapy Alert Card reviewed?:  not applicable

## 2023-02-25 DIAGNOSIS — D649 Anemia, unspecified: Secondary | ICD-10-CM | POA: Diagnosis not present

## 2023-02-25 LAB — BASIC METABOLIC PANEL
Anion gap: 8 (ref 5–15)
BUN: 7 mg/dL — ABNORMAL LOW (ref 8–23)
CO2: 24 mmol/L (ref 22–32)
Calcium: 8.6 mg/dL — ABNORMAL LOW (ref 8.9–10.3)
Chloride: 102 mmol/L (ref 98–111)
Creatinine, Ser: 0.64 mg/dL (ref 0.44–1.00)
GFR, Estimated: >60 mL/min (ref >60)
Glucose, Bld: 109 mg/dL — ABNORMAL HIGH (ref 70–99)
Potassium: 4.8 mmol/L (ref 3.5–5.1)
Sodium: 134 mmol/L — ABNORMAL LOW (ref 135–145)

## 2023-02-25 LAB — CBC
HCT: 26 % — ABNORMAL LOW (ref 36.0–46.0)
Hemoglobin: 7.7 g/dL — ABNORMAL LOW (ref 12.0–15.0)
MCH: 22.3 pg — ABNORMAL LOW (ref 26.0–34.0)
MCHC: 29.6 g/dL — ABNORMAL LOW (ref 30.0–36.0)
MCV: 75.4 fL — ABNORMAL LOW (ref 80.0–100.0)
Platelets: 282 10*3/uL (ref 150–400)
RBC: 3.45 MIL/uL — ABNORMAL LOW (ref 3.87–5.11)
RDW: 25.1 % — ABNORMAL HIGH (ref 11.5–15.5)
WBC: 5.3 10*3/uL (ref 4.0–10.5)
nRBC: 0 % (ref 0.0–0.2)

## 2023-02-25 LAB — SURGICAL PATHOLOGY

## 2023-02-25 MED ORDER — ACETAMINOPHEN 500 MG PO TABS: 1000.0000 mg | ORAL_TABLET | Freq: Four times a day (QID) | ORAL | Status: AC | PRN

## 2023-02-25 MED ORDER — OXYCODONE HCL 5 MG PO TABS: 5.0000 mg | ORAL_TABLET | Freq: Four times a day (QID) | ORAL | 0 refills | Status: DC | PRN

## 2023-02-25 MED ORDER — AMOXICILLIN-POT CLAVULANATE 875-125 MG PO TABS: 1.0000 | ORAL_TABLET | Freq: Two times a day (BID) | ORAL | 0 refills | Status: AC

## 2023-02-25 MED ORDER — AMOXICILLIN-POT CLAVULANATE 875-125 MG PO TABS
1.0000 | ORAL_TABLET | Freq: Two times a day (BID) | ORAL | Status: DC
  Administered 2023-02-25 – 2023-02-27 (×5): 1 via ORAL
  Filled 2023-02-25 (×6): qty 1

## 2023-02-25 NOTE — Plan of Care (Signed)

## 2023-02-25 NOTE — Discharge Instructions (Signed)

## 2023-02-25 NOTE — Progress Notes (Signed)
Messaged MD on secure chat confirming he is aware patient has constant dripping of big drops of blood out of her bottom

## 2023-02-25 NOTE — Discharge Summary (Signed)
Physician Discharge Summary  Debra Fry DOB: 1940-10-17 DOA: 02/16/2023  PCP: Patient, No Pcp Per  Admit date: 02/16/2023 Discharge date: 02/25/2023  Admitted From: Home Disposition:  Home  Recommendations for Outpatient Follow-up:  Follow up with PCP in 1-2 weeks Please obtain BMP/CBC in one week   Home Health:No Equipment/Devices:None  Discharge Condition:Stable CODE STATUS:Full Diet recommendation: Heart Healthy  Brief/Interim Summary: 83 y.o. 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   Discharge Diagnoses:  Principal Problem:   Symptomatic anemia Active Problems:   Familial hyperlipidemia   Diabetes (HCC)   Hypothyroidism   Adjustment disorder with anxiety   Gout   Leucocytosis  Acute blood loss anemia/symptomatic anemia due to colonic mass: GI was consulted who performed colonoscopy showed large mass extending into the cecal valve and ascending colon. General surgery was consulted to perform a right colectomy on 02/14/2023. CT scan of the abdomen and pelvis showed no mets. CEA was 0.8. NG tube was removed her diet was advanced which she tolerated she required minimal pain medication. Physical therapy evaluated the patient recommended home health PT. Oncology was notified and she will follow-up with oncology as an outpatient.  Diabetes mellitus type 2 diet controlled: Noted A1c of 5.8.  Hypokalemia: Repleted orally now  improved.  History of ischemic cardiomyopathy: With an EF of 55%: She will follow-up with cardiology as an outpatient.  History of CVA: Will restart aspirin as an outpatient.  Hyperlipidemia: No changes made to medication.  Vitamin B12 and vitamin D deficiency : continue oral supplementation as an outpatient.  Discharge Instructions  Discharge Instructions     Diet - low sodium heart healthy   Complete by: As directed    Increase activity slowly   Complete by: As directed       Allergies as of 02/25/2023       Reactions   Atorvastatin    Knee pain        Medication List     TAKE these medications    acetaminophen 500 MG tablet Commonly known as: TYLENOL Take 2 tablets (1,000 mg total) by mouth every 6 (six) hours as needed.   amoxicillin-clavulanate 875-125 MG tablet Commonly known as: AUGMENTIN Take 1 tablet by mouth every 12 (twelve) hours for 5 days.   calcium carbonate 500 MG chewable tablet Commonly known as: TUMS - dosed in mg elemental calcium Chew 1 tablet by mouth daily as needed for indigestion or heartburn.   carbamazepine 100 MG 12 hr capsule Commonly known as: CARBATROL Take 100 mg by mouth daily as needed (acute TN flare ups).   cyanocobalamin 1000 MCG tablet Commonly known as: VITAMIN B12 Take 1 tablet (1,000 mcg total) by mouth daily.   gabapentin 300 MG capsule Commonly known as: Neurontin Take 1 capsule (300 mg total) by mouth 3 (three) times daily. What changed:  how much to take when to take this reasons to take this   oxyCODONE 5 MG immediate release tablet Commonly known as: Oxy IR/ROXICODONE Take 1 tablet (5 mg total)  by mouth every 6 (six) hours as needed (pain).   pantoprazole 40 MG tablet Commonly known as: Protonix Take 1 tablet (40 mg total) by mouth 2 (two) times daily before a meal. GI recommending Protonix twice a day for 3 months then once a day   vitamin D3 25 MCG tablet Commonly known as:  CHOLECALCIFEROL Take 2 tablets (2,000 Units total) by mouth daily.        Follow-up Information     Ladene Artist, MD Follow up.   Specialty: Oncology Contact information: [address] [place] [address] [phone]         [facility] Follow up on 03/07/2023.   Specialty: General Surgery Why: this is a nurse visit only for staple removal., Arrive 30 minutes prior to your appointment time, Please bring your insurance card and photo ID Contact information: [address] [place] [address] [phone]         Berna Bue, MD Follow up in 3 week(s).   Specialty: General Surgery Why: Arrive 15 minutes prior to your appointment time, Please bring your insurance card and photo ID Contact information: [address] [place] [address] [phone]                Allergies  Allergen Reactions   Atorvastatin     Knee pain    Consultations: General Surgery Gastroenterology   Procedures/Studies: ECHOCARDIOGRAM COMPLETE Result Date: 02/19/2023    ECHOCARDIOGRAM REPORT   Patient Name:   Debra Fry Date of Exam: 02/19/2023 Medical Rec #:  [id number]        Height:       62.0 in Accession #:    [id number]       Weight:       120.0 lb Date of Birth:  Sep 29, 1940       BSA:          1.539 m Patient Age:    82 years         BP:           140/41 mmHg Patient Gender: F                HR:           73 bpm. Exam Location:  Inpatient Procedure: 2D Echo, Cardiac Doppler, Color Doppler and Intracardiac            Opacification Agent Indications:    R94.31 Abnormal EKG  History:        Patient has no prior history of Echocardiogram examinations.                 Stroke; Risk Factors:Diabetes.  Sonographer:    Webb Laws Referring Phys: [id number] ELIZABETH G MATHEWS IMPRESSIONS  1. Left ventricular ejection fraction, by estimation, is 60 to 65%. The left ventricle has normal function. The left ventricle has no  regional wall motion abnormalities. There is mild left ventricular hypertrophy of the basal-septal segment. Left ventricular diastolic parameters are consistent with Grade I diastolic dysfunction (impaired relaxation). Elevated left atrial pressure.  2. Right ventricular systolic function is normal. The right ventricular size is normal.  3. The mitral valve is normal in structure. Trivial mitral valve regurgitation. No evidence of mitral stenosis.  4. The aortic valve is tricuspid. Aortic valve regurgitation is not visualized. No aortic stenosis is present.  5. The inferior vena cava is normal in size with greater than 50% respiratory variability, suggesting right  atrial pressure of 3 mmHg. Comparison(s): No prior Echocardiogram. FINDINGS  Left Ventricle: Left ventricular ejection fraction, by estimation, is 60 to 65%. The left ventricle has normal function. The left ventricle has no regional wall motion abnormalities. Definity contrast agent was given IV to delineate the left ventricular  endocardial borders. The left ventricular internal cavity size was normal in size. There is mild left ventricular hypertrophy of the basal-septal segment. Left ventricular diastolic parameters are consistent with Grade I diastolic dysfunction (impaired relaxation). Elevated left atrial pressure. Right Ventricle: The right ventricular size is normal. Right ventricular systolic function is normal. Left Atrium: Left atrial size was normal in size. Right Atrium: Right atrial size was normal in size. Pericardium: There is no evidence of pericardial effusion. Mitral Valve: The mitral valve is normal in structure. Trivial mitral valve regurgitation. No evidence of mitral valve stenosis. Tricuspid Valve: The tricuspid valve is normal in structure. Tricuspid valve regurgitation is trivial. No evidence of tricuspid stenosis. Aortic Valve: The aortic valve is tricuspid. Aortic valve regurgitation is not visualized. No aortic stenosis is  present. Pulmonic Valve: The pulmonic valve was normal in structure. Pulmonic valve regurgitation is trivial. No evidence of pulmonic stenosis. Aorta: The aortic root is normal in size and structure. Venous: The inferior vena cava is normal in size with greater than 50% respiratory variability, suggesting right atrial pressure of 3 mmHg. IAS/Shunts: No atrial level shunt detected by color flow Doppler.  LEFT VENTRICLE PLAX 2D LVIDd:         4.10 cm     Diastology LVIDs:         3.00 cm     LV e' medial:    4.13 cm/s LV PW:         1.10 cm     LV E/e' medial:  21.4 LV IVS:        1.40 cm     LV e' lateral:   8.05 cm/s LVOT diam:     1.80 cm     LV E/e' lateral: 11.0 LV SV:         59 LV SV Index:   39 LVOT Area:     2.54 cm  LV Volumes (MOD) LV vol d, MOD A2C: 65.0 ml LV vol d, MOD A4C: 91.1 ml LV vol s, MOD A2C: 32.9 ml LV vol s, MOD A4C: 30.6 ml LV SV MOD A2C:     32.1 ml LV SV MOD A4C:     91.1 ml LV SV MOD BP:      45.1 ml RIGHT VENTRICLE             IVC RV Basal diam:  3.40 cm     IVC diam: 1.40 cm RV S prime:     10.40 cm/s TAPSE (M-mode): 1.6 cm LEFT ATRIUM             Index        RIGHT ATRIUM           Index LA diam:        3.70 cm 2.40 cm/m   RA Area:     10.20 cm LA Vol (A2C):   33.3 ml 21.64 ml/m  RA Volume:   20.30 ml  13.19 ml/m LA Vol (A4C):   40.5 ml 26.32 ml/m LA Biplane Vol: 40.4 ml 26.26 ml/m  AORTIC VALVE             PULMONIC VALVE LVOT Vmax:   100.00 cm/s PR End Diast Vel:  1.13 msec LVOT Vmean:  69.300 cm/s LVOT VTI:    0.233 m  AORTA Ao Root diam: 2.70 cm Ao Asc diam:  3.30 cm MITRAL VALVE                TRICUSPID VALVE MV Area (PHT): 3.72 cm     TR Peak grad:   13.2 mmHg MV Decel Time: 204 msec     TR Vmax:        182.00 cm/s MV E velocity: 88.30 cm/s MV A velocity: 107.00 cm/s  SHUNTS MV E/A ratio:  0.83         Systemic VTI:  0.23 m                             Systemic Diam: 1.80 cm Olga Millers MD Electronically signed by Olga Millers MD Signature Date/Time: 02/19/2023/1:14:49 PM     Final    CT CHEST ABDOMEN PELVIS W CONTRAST Result Date: 02/18/2023 CLINICAL DATA:  New diagnosis colon cancer, staging * Tracking Code: BO * EXAM: CT CHEST, ABDOMEN, AND PELVIS WITH CONTRAST TECHNIQUE: Multidetector CT imaging of the chest, abdomen and pelvis was performed following the standard protocol during bolus administration of intravenous contrast. RADIATION DOSE REDUCTION: This exam was performed according to the departmental dose-optimization program which includes automated exposure control, adjustment of the mA and/or kV according to patient size and/or use of iterative reconstruction technique. CONTRAST:  OMNIPAQUE IOHEXOL 300 MG/ML SOLN additional oral enteric contrast COMPARISON:  None Available. FINDINGS: CT CHEST FINDINGS Cardiovascular: Aortic atherosclerosis. Cardiomegaly. Three-vessel coronary artery calcifications status post median sternotomy and CABG. No pericardial effusion. Mediastinum/Nodes: No enlarged mediastinal, hilar, or axillary lymph nodes. Moderate hiatal hernia with intrathoracic position of the gastric fundus. Thyroid gland, trachea, and esophagus demonstrate no significant findings. Lungs/Pleura: Lungs are clear. No pleural effusion or pneumothorax. Musculoskeletal: No chest wall abnormality. No acute osseous findings. CT ABDOMEN PELVIS FINDINGS Hepatobiliary: No solid liver abnormality is seen. No gallstones, gallbladder wall thickening, or biliary dilatation. Pancreas: Unremarkable. No pancreatic ductal dilatation or surrounding inflammatory changes. Spleen: Normal in size without significant abnormality. Adrenals/Urinary Tract: Adrenal glands are unremarkable. Kidneys are normal, without renal calculi, solid lesion, or hydronephrosis. Bladder is unremarkable. Stomach/Bowel: Stomach is within normal limits. Sessile mass of the cecum just superior to the ileocecal valve, approximately 4.8 x 3.5 cm (series 6, image 40, series 2, image 84). Sigmoid diverticulosis.  Vascular/Lymphatic: Severe aortic atherosclerosis. Enlarged lymph nodes within the right lower quadrant mesocolon adjacent to cecal mass, measuring up to 0.9 x 0.6 cm (series 2, image 80). Reproductive: No mass or other abnormality. Other: No abdominal wall hernia or abnormality. No ascites. Musculoskeletal: No acute osseous findings. IMPRESSION: 1. Sessile mass of the cecum just superior to the ileocecal valve, consistent with primary colon malignancy. 2. Enlarged lymph nodes within the right lower quadrant mesocolon adjacent to cecal mass, consistent with nodal metastases. 3. No evidence of distant metastatic disease in the chest, abdomen, or pelvis. 4. Sigmoid diverticulosis without evidence of acute diverticulitis. 5. Moderate hiatal hernia with intrathoracic position of the gastric fundus. 6. Cardiomegaly and coronary artery disease. Aortic Atherosclerosis (ICD10-I70.0). Electronically Signed   By: Jearld Lesch M.D.   On: 02/18/2023 17:11   DG Chest 2 View Result Date: 02/16/2023 CLINICAL DATA:  SOB EXAM: CHEST - 2 VIEW COMPARISON:  01/21/23. FINDINGS: Sternal wires. Post op changes. No consolidation, pneumothorax, effusion or edema. Calcified aorta. Bilateral apical pleural thickening.  Degenerative changes along the spine. IMPRESSION: Post op chest.  Chronic changes. Electronically Signed   By: Karen Kays M.D.   On: 02/16/2023 15:04     Subjective: No plaints  Discharge Exam: Vitals:   02/25/23 0523 02/25/23 0614  BP: (!) 184/70 (!) 134/49  Pulse: 80 93  Resp: 15   Temp: 99 F (37.2 C)   SpO2: 96%    Vitals:   02/24/23 1321 02/24/23 2008 02/25/23 0523 02/25/23 0614  BP: (!) 145/64 (!) 152/58 (!) 184/70 (!) 134/49  Pulse: 97 96 80 93  Resp: 16 18 15    Temp: 98.5 F (36.9 C) 99.3 F (37.4 C) 99 F (37.2 C)   TempSrc: Oral Oral Oral   SpO2: 99% 98% 96%   Weight:      Height:        General: Pt is alert, awake, not in acute distress Cardiovascular: RRR, S1/S2 +, no rubs, no  gallops Respiratory: CTA bilaterally, no wheezing, no rhonchi Abdominal: Soft, NT, ND, bowel sounds + Extremities: no edema, no cyanosis    The results of significant diagnostics from this hospitalization (including imaging, microbiology, ancillary and laboratory) are listed below for reference.     Microbiology: No results found for this or any previous visit (from the past 240 hours).   Labs: BNP (last 3 results) No results for input(s): "BNP" in the last 8760 hours. Basic Metabolic Panel: Recent Labs  Lab 02/19/23 0723 02/22/23 0524 02/24/23 0447 02/25/23 0419  NA 136 132* 135 134*  K 3.9 3.6 3.1* 4.8  CL 100 100 105 102  CO2 25 22 23 24   GLUCOSE 93 117* 93 109*  BUN 13 9 6* 7*  CREATININE 0.77 0.86 0.57 0.64  CALCIUM 9.0 8.6* 8.4* 8.6*  MG  --   --  1.5*  --    Liver Function Tests: Recent Labs  Lab 02/19/23 0723  AST 19  ALT 14  ALKPHOS 63  BILITOT 1.4*  PROT 7.5  ALBUMIN 3.2*   No results for input(s): "LIPASE", "AMYLASE" in the last 168 hours. No results for input(s): "AMMONIA" in the last 168 hours. CBC: Recent Labs  Lab 02/19/23 0723 02/22/23 0524 02/24/23 0447 02/25/23 0419  WBC 7.7 2.7* 7.8 5.3  NEUTROABS  --  1.5*  --   --   HGB 9.1* 9.2* 7.9* 7.7*  HCT 29.8* 30.4* 26.4* 26.0*  MCV 73.6* 75.4* 74.8* 75.4*  PLT 306 281 240 282   Cardiac Enzymes: No results for input(s): "CKTOTAL", "CKMB", "CKMBINDEX", "TROPONINI" in the last 168 hours. BNP: Invalid input(s): "POCBNP" CBG: No results for input(s): "GLUCAP" in the last 168 hours. D-Dimer No results for input(s): "DDIMER" in the last 72 hours. Hgb A1c No results for input(s): "HGBA1C" in the last 72 hours. Lipid Profile No results for input(s): "CHOL", "HDL", "LDLCALC", "TRIG", "CHOLHDL", "LDLDIRECT" in the last 72 hours. Thyroid function studies Recent Labs    02/23/23 0552  TSH 1.847   Anemia work up No results for input(s): "VITAMINB12", "FOLATE", "FERRITIN", "TIBC", "IRON",  "RETICCTPCT" in the last 72 hours. Urinalysis    Component Value Date/Time   COLORURINE YELLOW 09/15/2020 1634   APPEARANCEUR CLEAR 09/15/2020 1634   LABSPEC 1.014 09/15/2020 1634   PHURINE 5.5 09/15/2020 1634   GLUCOSEU NEGATIVE 09/15/2020 1634   HGBUR NEGATIVE 09/15/2020 1634   BILIRUBINUR NEGATIVE 02/08/2018 1515   KETONESUR NEGATIVE 09/15/2020 1634   PROTEINUR NEGATIVE 09/15/2020 1634   NITRITE POSITIVE (A) 02/08/2018 1515   LEUKOCYTESUR SMALL (  A) 02/08/2018 1515   Sepsis Labs Recent Labs  Lab 02/19/23 0723 02/22/23 0524 02/24/23 0447 02/25/23 0419  WBC 7.7 2.7* 7.8 5.3   Microbiology No results found for this or any previous visit (from the past 240 hours).   Time coordinating discharge: Over 35 minutes  SIGNED:   Marinda Elk, MD  [facility] 02/25/2023, 9:33 AM Pager   If 7PM-7AM, please contact night-coverage [email or web address] Password [email or web address]

## 2023-02-25 NOTE — Plan of Care (Signed)
  Problem: Education: Goal: Knowledge of General Education information will improve Description: Including pain rating scale, medication(s)/side effects and non-pharmacologic comfort measures Outcome: Adequate for Discharge   Problem: Health Behavior/Discharge Planning: Goal: Ability to manage health-related needs will improve Outcome: Adequate for Discharge   Problem: Clinical Measurements: Goal: Ability to maintain clinical measurements within normal limits will improve Outcome: Adequate for Discharge Goal: Will remain free from infection Outcome: Adequate for Discharge Goal: Diagnostic test results will improve Outcome: Adequate for Discharge Goal: Respiratory complications will improve Outcome: Adequate for Discharge Goal: Cardiovascular complication will be avoided Outcome: Adequate for Discharge   Problem: Coping: Goal: Level of anxiety will decrease Outcome: Adequate for Discharge   Problem: Elimination: Goal: Will not experience complications related to bowel motility Outcome: Adequate for Discharge Goal: Will not experience complications related to urinary retention Outcome: Adequate for Discharge   Problem: Nutrition: Goal: Adequate nutrition will be maintained Outcome: Adequate for Discharge   Problem: Skin Integrity: Goal: Risk for impaired skin integrity will decrease Outcome: Adequate for Discharge   Problem: Education: Goal: Understanding of discharge needs will improve Outcome: Adequate for Discharge Goal: Verbalization of understanding of the causes of altered bowel function will improve Outcome: Adequate for Discharge

## 2023-02-25 NOTE — Progress Notes (Signed)
3 Days Post-Op   Subjective/Chief Complaint: Some abdominal soreness today from coughing and diarrhea.  Has had 5 episodes of diarrhea.  She is tolerating a soft diet well with no issues.  Mobilizing well.   Objective: Vital signs in last 24 hours: Temp:  [98.5 F (36.9 C)-99.3 F (37.4 C)] 99 F (37.2 C) (01/31 0523) Pulse Rate:  [80-97] 93 (01/31 0614) Resp:  [15-18] 15 (01/31 0523) BP: (134-184)/(49-70) 134/49 (01/31 0614) SpO2:  [96 %-99 %] 96 % (01/31 0523) Last BM Date : 02/21/23  Intake/Output from previous day: 01/30 0701 - 01/31 0700 In: 240 [P.O.:240] Out: -  Intake/Output this shift: No intake/output data recorded.  Abd soft, minimally tender, but appropriate post op, ND. Incisions c/d/I no cellulitis or hematoma, staples intact   Lab Results:  Recent Labs    02/24/23 0447 02/25/23 0419  WBC 7.8 5.3  HGB 7.9* 7.7*  HCT 26.4* 26.0*  PLT 240 282   BMET Recent Labs    02/24/23 0447 02/25/23 0419  NA 135 134*  K 3.1* 4.8  CL 105 102  CO2 23 24  GLUCOSE 93 109*  BUN 6* 7*  CREATININE 0.57 0.64  CALCIUM 8.4* 8.6*   PT/INR No results for input(s): "LABPROT", "INR" in the last 72 hours. ABG No results for input(s): "PHART", "HCO3" in the last 72 hours.  Invalid input(s): "PCO2", "PO2"  Studies/Results: No results found.   Anti-infectives: Anti-infectives (From admission, onward)    Start     Dose/Rate Route Frequency Ordered Stop   02/22/23 0600  cefoTEtan (CEFOTAN) 2 g in sodium chloride 0.9 % 100 mL IVPB        2 g 200 mL/hr over 30 Minutes Intravenous On call to O.R. 02/21/23 1610 02/22/23 0940   02/21/23 1400  neomycin (MYCIFRADIN) tablet 1,000 mg       Placed in "And" Linked Group   1,000 mg Oral 3 times per day 02/21/23 0851 02/21/23 2131   02/21/23 1400  metroNIDAZOLE (FLAGYL) tablet 1,000 mg       Placed in "And" Linked Group   1,000 mg Oral 3 times per day 02/21/23 0851 02/21/23 2130       Assessment/Plan: POD 3, Right colon  mass w bleeding s/p laparoscopic right hemicolectomy 1/28, Dr. Fredricka Bonine.  -Doing well. -tolerating soft diet well -having good bowel function.  Diarrhea not unexpected at the beginning.  Discussed it will take time for her "new norm" to occur. -Mobilize -K 4.8 -hgb 7.7, stable today.  No signs of bleeding -patient is surgically stable for DC home.  Discussed with primary service.  FEN - soft VTE - Lovenox ID - none currently needed   LOS: 9 days    Letha Cape 02/25/2023

## 2023-02-26 ENCOUNTER — Inpatient Hospital Stay (HOSPITAL_COMMUNITY): Payer: Medicare HMO

## 2023-02-26 DIAGNOSIS — D649 Anemia, unspecified: Secondary | ICD-10-CM | POA: Diagnosis not present

## 2023-02-26 LAB — CBC
HCT: 24.6 % — ABNORMAL LOW (ref 36.0–46.0)
Hemoglobin: 7.5 g/dL — ABNORMAL LOW (ref 12.0–15.0)
MCH: 22.7 pg — ABNORMAL LOW (ref 26.0–34.0)
MCHC: 30.5 g/dL (ref 30.0–36.0)
MCV: 74.5 fL — ABNORMAL LOW (ref 80.0–100.0)
Platelets: 295 10*3/uL (ref 150–400)
RBC: 3.3 MIL/uL — ABNORMAL LOW (ref 3.87–5.11)
RDW: 25 % — ABNORMAL HIGH (ref 11.5–15.5)
WBC: 5 10*3/uL (ref 4.0–10.5)
nRBC: 0 % (ref 0.0–0.2)

## 2023-02-26 LAB — IRON AND TIBC
Iron: 30 ug/dL (ref 28–170)
Saturation Ratios: 8 % — ABNORMAL LOW (ref 10.4–31.8)
TIBC: 363 ug/dL (ref 250–450)
UIBC: 333 ug/dL

## 2023-02-26 LAB — BASIC METABOLIC PANEL
Anion gap: 10 (ref 5–15)
BUN: 10 mg/dL (ref 8–23)
CO2: 25 mmol/L (ref 22–32)
Calcium: 8.7 mg/dL — ABNORMAL LOW (ref 8.9–10.3)
Chloride: 97 mmol/L — ABNORMAL LOW (ref 98–111)
Creatinine, Ser: 0.72 mg/dL (ref 0.44–1.00)
GFR, Estimated: >60 mL/min (ref >60)
Glucose, Bld: 106 mg/dL — ABNORMAL HIGH (ref 70–99)
Potassium: 4 mmol/L (ref 3.5–5.1)
Sodium: 132 mmol/L — ABNORMAL LOW (ref 135–145)

## 2023-02-26 LAB — FERRITIN: Ferritin: 62 ng/mL (ref 11–307)

## 2023-02-26 LAB — RETICULOCYTES
Immature Retic Fract: 22.5 % — ABNORMAL HIGH (ref 2.3–15.9)
RBC.: 3.34 MIL/uL — ABNORMAL LOW (ref 3.87–5.11)
Retic Count, Absolute: 22 10*3/uL (ref 19.0–186.0)
Retic Ct Pct: 0.7 % (ref 0.4–3.1)

## 2023-02-26 LAB — VITAMIN B12: Vitamin B-12: 930 pg/mL — ABNORMAL HIGH (ref 180–914)

## 2023-02-26 LAB — FOLATE: Folate: 13.6 ng/mL (ref >5.9)

## 2023-02-26 MED ORDER — CARMEX CLASSIC LIP BALM EX OINT
TOPICAL_OINTMENT | CUTANEOUS | Status: DC | PRN
  Filled 2023-02-26: qty 10

## 2023-02-26 NOTE — Progress Notes (Signed)
Lab called and stated the wrong collection tube was used with the correct swab.  I reported that the initial lab phone call stated he would send me the correct swab collection device.  This was reported to the lab when they called and told me the wrong collection device was used and she stated "sorry he may have gotten it confused."  Pt had stated "do not come back and try this again because the answer is no.  To notify MD at this time

## 2023-02-26 NOTE — Progress Notes (Signed)
4 Days Post-Op   Subjective/Chief Complaint: No more blood with bms, better today, tol  diet   Objective: Vital signs in last 24 hours: Temp:  [98.3 F (36.8 C)-98.9 F (37.2 C)] 98.6 F (37 C) (02/01 0526) Pulse Rate:  [74-88] 88 (02/01 0526) Resp:  [17-18] 17 (02/01 0526) BP: (114-160)/(56-65) 153/60 (02/01 0526) SpO2:  [95 %-99 %] 95 % (02/01 0526) Last BM Date : 02/25/23  Intake/Output from previous day: No intake/output data recorded. Intake/Output this shift: No intake/output data recorded.  Ab soft nontender nondistended dressing dry  Lab Results:  Recent Labs    02/25/23 0419 02/26/23 0430  WBC 5.3 5.0  HGB 7.7* 7.5*  HCT 26.0* 24.6*  PLT 282 295   BMET Recent Labs    02/25/23 0419 02/26/23 0430  NA 134* 132*  K 4.8 4.0  CL 102 97*  CO2 24 25  GLUCOSE 109* 106*  BUN 7* 10  CREATININE 0.64 0.72  CALCIUM 8.6* 8.7*   PT/INR No results for input(s): "LABPROT", "INR" in the last 72 hours. ABG No results for input(s): "PHART", "HCO3" in the last 72 hours.  Invalid input(s): "PCO2", "PO2"  Studies/Results: No results found.  Anti-infectives: Anti-infectives (From admission, onward)    Start     Dose/Rate Route Frequency Ordered Stop   02/25/23 1030  amoxicillin-clavulanate (AUGMENTIN) 875-125 MG per tablet 1 tablet        1 tablet Oral Every 12 hours 02/25/23 0931 03/02/23 0959   02/25/23 0000  amoxicillin-clavulanate (AUGMENTIN) 875-125 MG tablet        1 tablet Oral Every 12 hours 02/25/23 0931 03/02/23 2359   02/22/23 0600  cefoTEtan (CEFOTAN) 2 g in sodium chloride 0.9 % 100 mL IVPB        2 g 200 mL/hr over 30 Minutes Intravenous On call to O.R. 02/21/23 8416 02/22/23 0940   02/21/23 1400  neomycin (MYCIFRADIN) tablet 1,000 mg       Placed in "And" Linked Group   1,000 mg Oral 3 times per day 02/21/23 0851 02/21/23 2131   02/21/23 1400  metroNIDAZOLE (FLAGYL) tablet 1,000 mg       Placed in "And" Linked Group   1,000 mg Oral 3 times per  day 02/21/23 0851 02/21/23 2130       Assessment/Plan: POD 4 laparoscopic right hemicolectomy 1/28, Dr. Fredricka Bonine.  -Doing better today, hb lower with a little blood yesterday but this is resolved now -tolerating soft diet well -patient is surgically stable for DC home.     FEN - soft VTE - Lovenox ID - none currently needed   Emelia Loron 02/26/2023

## 2023-02-26 NOTE — Progress Notes (Signed)
[facility] PROGRESS NOTE    Progress Note  Debra Fry  ZOX:[id number] DOB: April 18, 1940 DOA: 02/16/2023 PCP: Patient, No Pcp Per     Brief Narrative:   Debra Fry is an 83 y.o. 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 Out of bed to chair consult physical therapy. Mild drop in hemoglobin restart a DOAC recheck CBC in the morning. Check an anemia panel on MCV w slow  New cough: Check a chest x-ray, continue Augmentin. Check respiratory panel. She relates he has a poor appetite. Has remained afebrile with no leukocytosis. Concern about aspiration pneumonia. Out of bed to chair continues inspirometry and flutter valve.  Diabetes mellitus type 2: With a last A1c of 5.8. Blood glucose well-controlled on diet.  Hypokalemia: Replete orally recheck in the morning. Acute potassium greater than 4 magnesium greater than 2.  History of ischemic cardiomyopathy: With a last EF of 55%, Seen by cardiology they had no further workup prior to surgery.  History of CVA: Noted, will need aspirin once okay with surgery.  Familial hyperlipidemia Will need to be on a statin once able to take orals.  Vitamin D B12 deficiency: Continue supplementation.  Anxiety: Continue carbamazepine.  DVT  prophylaxis: lovenox Family Communication:none Status is: Inpatient Remains inpatient appropriate because: Acute blood loss anemia due to colonic mass    Code Status:     Code Status Orders  (From admission, onward)           Start     Ordered   02/16/23 1846  Full code  Continuous       Question:  By:  Answer:  Consent: discussion documented in EHR   02/16/23 1847           Code Status History     Date Active Date Inactive Code Status Order ID Comments User Context   01/22/2023 1143 01/23/2023 1927 Full Code [id number]  Bobette Mo, MD Inpatient   02/13/2018 1702 02/14/2018 1742 Full Code [id number]  Sherren Kerns, MD Inpatient   01/27/2018 2316 01/29/2018 2206 Full Code [id number]  John Giovanni, MD Inpatient         IV Access:   Peripheral IV   Procedures and diagnostic studies:   No results found.   Medical Consultants:   None.   Subjective:    Debra Fry  has had a bloody bowel movement yesterday new cough poor appetite.  Objective:    Vitals:   02/25/23 0614 02/25/23 1323 02/25/23 2002 02/26/23 0526  BP: (!) 134/49 (!) 160/65 (!) 114/56 (!) 153/60  Pulse: 93 74 85 88  Resp:  18 18 17   Temp:  98.3 F (36.8 C) 98.9 F (37.2 C) 98.6 F (37 C)  TempSrc:    Oral  SpO2:  95% 99% 95%  Weight:      Height:       SpO2:  95 % O2 Flow Rate (L/min): 2 L/min  No intake or output data in the 24 hours ending 02/26/23 1051  Filed Weights   02/16/23 1418  Weight: 54.4 kg    Exam: General exam: In no acute distress. Respiratory system: Good air movement and crackles in the right lower lung Cardiovascular system: S1 & S2 heard, RRR. No JVD. Gastrointestinal system: Abdomen is nondistended, soft and nontender.  Extremities: No pedal edema. Skin: No rashes, lesions or ulcers Psychiatry: Judgement and insight appear normal. Mood & affect appropriate.  Data Reviewed:    Labs: Basic Metabolic Panel: Recent Labs  Lab  02/22/23 0524 02/24/23 0447 02/25/23 0419 02/26/23 0430  NA 132* 135 134* 132*  K 3.6 3.1* 4.8 4.0  CL 100 105 102 97*  CO2 22 23 24 25   GLUCOSE 117* 93 109* 106*  BUN 9 6* 7* 10  CREATININE 0.86 0.57 0.64 0.72  CALCIUM 8.6* 8.4* 8.6* 8.7*  MG  --  1.5*  --   --    GFR Estimated Creatinine Clearance: 44.4 mL/min (by C-G formula based on SCr of 0.72 mg/dL). Liver Function Tests: No results for input(s): "AST", "ALT", "ALKPHOS", "BILITOT", "PROT", "ALBUMIN" in the last 168 hours.  No results for input(s): "LIPASE", "AMYLASE" in the last 168 hours. No results for input(s): "AMMONIA" in the last 168 hours. Coagulation profile No results for input(s): "INR", "PROTIME" in the last 168 hours. COVID-19 Labs  No results for input(s): "DDIMER", "FERRITIN", "LDH", "CRP" in the last 72 hours.  Lab Results  Component Value Date   SARSCOV2NAA NOT DETECTED 11/20/2018    CBC: Recent Labs  Lab 02/22/23 0524 02/24/23 0447 02/25/23 0419 02/26/23 0430  WBC 2.7* 7.8 5.3 5.0  NEUTROABS 1.5*  --   --   --   HGB 9.2* 7.9* 7.7* 7.5*  HCT 30.4* 26.4* 26.0* 24.6*  MCV 75.4* 74.8* 75.4* 74.5*  PLT 281 240 282 295   Cardiac Enzymes: No results for input(s): "CKTOTAL", "CKMB", "CKMBINDEX", "TROPONINI" in the last 168 hours. BNP (last 3 results) No results for input(s): "PROBNP" in the last 8760 hours. CBG: No results for input(s): "GLUCAP" in the last 168 hours. D-Dimer: No results for input(s): "DDIMER" in the last 72 hours. Hgb A1c: No results for input(s): "HGBA1C" in the last 72 hours. Lipid Profile: No results for input(s): "CHOL", "HDL", "LDLCALC", "TRIG", "CHOLHDL", "LDLDIRECT" in the last 72 hours. Thyroid function studies: No results for input(s): "TSH", "T4TOTAL", "T3FREE", "THYROIDAB" in the last 72 hours.  Invalid input(s): "FREET3"  Anemia work up: No results for input(s): "VITAMINB12", "FOLATE", "FERRITIN", "TIBC", "IRON", "RETICCTPCT" in the last 72 hours. Sepsis  Labs: Recent Labs  Lab 02/22/23 0524 02/24/23 0447 02/25/23 0419 02/26/23 0430  WBC 2.7* 7.8 5.3 5.0   Microbiology No results found for this or any previous visit (from the past 240 hours).   Medications:    acetaminophen  1,000 mg Oral Q6H   amoxicillin-clavulanate  1 tablet Oral Q12H   vitamin D3  2,000 Units Oral Daily   cyanocobalamin  1,000 mcg Oral Daily   docusate sodium  100 mg Oral BID   feeding supplement  1 Container Oral TID BM   pantoprazole  40 mg Oral BID   Vitamin D (Ergocalciferol)  50,000 Units Oral Q7 days   Continuous Infusions:      LOS: 10 days   Marinda Elk  [facility]  02/26/2023, 10:51 AM

## 2023-02-26 NOTE — Progress Notes (Signed)
Resp swab sent per order

## 2023-02-26 NOTE — Progress Notes (Signed)
Swab sent to lab per order, lab called and stated the wrong swab was used

## 2023-02-27 DIAGNOSIS — D649 Anemia, unspecified: Secondary | ICD-10-CM | POA: Diagnosis not present

## 2023-02-27 LAB — RESPIRATORY PANEL BY PCR

## 2023-02-27 LAB — CBC
HCT: 25.1 % — ABNORMAL LOW (ref 36.0–46.0)
Hemoglobin: 7.7 g/dL — ABNORMAL LOW (ref 12.0–15.0)
MCH: 22.6 pg — ABNORMAL LOW (ref 26.0–34.0)
MCHC: 30.7 g/dL (ref 30.0–36.0)
MCV: 73.6 fL — ABNORMAL LOW (ref 80.0–100.0)
Platelets: 328 10*3/uL (ref 150–400)
RBC: 3.41 MIL/uL — ABNORMAL LOW (ref 3.87–5.11)
RDW: 25 % — ABNORMAL HIGH (ref 11.5–15.5)
WBC: 4.8 10*3/uL (ref 4.0–10.5)
nRBC: 0 % (ref 0.0–0.2)

## 2023-02-27 MED ORDER — FERROUS SULFATE 325 (65 FE) MG PO TABS: 325.0000 mg | ORAL_TABLET | Freq: Every day | ORAL | 3 refills | Status: DC

## 2023-02-27 MED ORDER — FERROUS SULFATE 325 (65 FE) MG PO TABS
325.0000 mg | ORAL_TABLET | Freq: Every day | ORAL | Status: DC
Start: 2023-02-28 — End: 2023-02-27

## 2023-02-27 NOTE — Discharge Summary (Signed)
Physician Discharge Summary  Debra Fry Debra Fry ZOX:[id number] DOB: 1940-07-08 DOA: 02/16/2023  PCP: Patient, No Pcp Per  Admit date: 02/16/2023 Discharge date: 02/27/2023  Admitted From: Home Disposition:  Home  Recommendations for Outpatient Follow-up:  Follow up with PCP in 1-2 weeks Please obtain BMP/CBC in one week Follow-up with oncology in 1 week, address whether aspirin can be restarted.   Home Health:No Equipment/Devices:None  Discharge Condition:Stable CODE STATUS:Full Diet recommendation: Heart Healthy  Brief/Interim Summary: 83 y.o. 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   Discharge Diagnoses:  Principal Problem:   Symptomatic anemia Active Problems:   Familial hyperlipidemia   Diabetes (HCC)   Hypothyroidism   Adjustment disorder with anxiety   Gout   Leucocytosis  Acute blood loss anemia/symptomatic anemia due to colonic mass: GI was consulted who performed colonoscopy showed large mass extending into the cecal valve and ascending colon. General surgery was consulted to perform a right colectomy on 02/14/2023.  Biopsy results are pending follow-up with oncology as an outpatient. CT scan of the abdomen and pelvis showed no mets. CEA was 0.8. NG tube was removed her diet was advanced which she tolerated she required minimal pain medication. Physical therapy evaluated the patient recommended home health PT. Oncology was notified and she will follow-up  with oncology as an outpatient. Aspirin was held will be restarted by oncology as an outpatient.  Diabetes mellitus type 2 diet controlled: Noted A1c of 5.8.  Hypokalemia: Repleted orally now improved.  History of ischemic cardiomyopathy: With an EF of 55%: She will follow-up with cardiology as an outpatient.  History of CVA: Follow-up with PCP as an outpatient. Aspirin was held on admission to be resume as an outpatient by hematology oncology  Hyperlipidemia: No changes made to medication.  Vitamin B12 and vitamin D deficiency : continue oral supplementation as an outpatient.  Discharge Instructions  Discharge Instructions     Diet - low sodium heart healthy   Complete by: As directed    Diet - low sodium heart healthy   Complete by: As directed    Increase activity slowly   Complete by: As directed    Increase activity slowly   Complete by: As directed       Allergies as of 02/27/2023       Reactions   Atorvastatin    Knee pain        Medication List     TAKE these medications    acetaminophen 500 MG tablet Commonly known as: TYLENOL Take 2 tablets (1,000 mg total) by mouth every 6 (six) hours as needed.   amoxicillin-clavulanate 875-125 MG tablet Commonly known as: AUGMENTIN Take 1 tablet by mouth every 12 (twelve) hours for 5 days.   calcium carbonate 500 MG chewable tablet Commonly known as: TUMS - dosed in mg elemental calcium Chew 1 tablet by mouth daily as needed for indigestion or heartburn.   carbamazepine 100 MG 12 hr capsule Commonly known as: CARBATROL Take 100 mg by mouth daily as needed (acute TN flare ups).  cyanocobalamin 1000 MCG tablet Commonly known as: VITAMIN B12 Take 1 tablet (1,000 mcg total) by mouth daily.   ferrous sulfate 325 (65 FE) MG tablet Take 1 tablet (325 mg total) by mouth daily with breakfast. Start taking on: February 28, 2023   gabapentin 300 MG capsule Commonly known as: Neurontin Take 1 capsule (300  mg total) by mouth 3 (three) times daily. What changed:  how much to take when to take this reasons to take this   oxyCODONE 5 MG immediate release tablet Commonly known as: Oxy IR/ROXICODONE Take 1 tablet (5 mg total) by mouth every 6 (six) hours as needed (pain).   pantoprazole 40 MG tablet Commonly known as: Protonix Take 1 tablet (40 mg total) by mouth 2 (two) times daily before a meal. GI recommending Protonix twice a day for 3 months then once a day   vitamin D3 25 MCG tablet Commonly known as: CHOLECALCIFEROL Take 2 tablets (2,000 Units total) by mouth daily.        Follow-up Information     Ladene Artist, MD Follow up.   Specialty: Oncology Contact information: [address] [place] [address] [phone]         [facility] Follow up on 03/07/2023.   Specialty: General Surgery Why: 2:00pm, this is a nurse visit only for staple removal., Arrive 30 minutes prior to your appointment time, Please bring your insurance card and photo ID Contact information: [address] [place] [address] [phone]         Berna Bue, MD Follow up on 03/17/2023.   Specialty: General Surgery Why: 10:15am, Arrive 15 minutes prior to your appointment time, Please bring your insurance card and photo ID Contact information: [address] [place] [address] [phone]                Allergies  Allergen Reactions   Atorvastatin     Knee pain    Consultations: General Surgery Gastroenterology   Procedures/Studies: DG CHEST PORT 1 VIEW Result Date: 02/26/2023 CLINICAL DATA:  Cough. EXAM: PORTABLE CHEST 1 VIEW COMPARISON:  July 17, 2023. FINDINGS: The heart size and mediastinal contours are within normal limits. Sternotomy wires are noted. Both lungs are clear. The visualized skeletal structures are unremarkable. IMPRESSION: No active disease. Electronically Signed   By: Lupita Raider M.D.    On: 02/26/2023 15:55   ECHOCARDIOGRAM COMPLETE Result Date: 02/19/2023    ECHOCARDIOGRAM REPORT   Patient Name:   Debra Fry Date of Exam: 02/19/2023 Medical Rec #:  [id number]        Height:       62.0 in Accession #:    [id number]       Weight:       120.0 lb Date of Birth:  [birth year]       BSA:          1.539 m Patient Age:    82 years         BP:           140/41 mmHg Patient Gender: F                HR:           73 bpm. Exam Location:  Inpatient Procedure: 2D Echo, Cardiac Doppler, Color Doppler and Intracardiac            Opacification Agent Indications:    R94.31 Abnormal EKG  History:  Patient has no prior history of Echocardiogram examinations.                 Stroke; Risk Factors:Diabetes.  Sonographer:    Webb Laws Referring Phys: [id number] ELIZABETH G MATHEWS IMPRESSIONS  1. Left ventricular ejection fraction, by estimation, is 60 to 65%. The left ventricle has normal function. The left ventricle has no regional wall motion abnormalities. There is mild left ventricular hypertrophy of the basal-septal segment. Left ventricular diastolic parameters are consistent with Grade I diastolic dysfunction (impaired relaxation). Elevated left atrial pressure.  2. Right ventricular systolic function is normal. The right ventricular size is normal.  3. The mitral valve is normal in structure. Trivial mitral valve regurgitation. No evidence of mitral stenosis.  4. The aortic valve is tricuspid. Aortic valve regurgitation is not visualized. No aortic stenosis is present.  5. The inferior vena cava is normal in size with greater than 50% respiratory variability, suggesting right atrial pressure of 3 mmHg. Comparison(s): No prior Echocardiogram. FINDINGS  Left Ventricle: Left ventricular ejection fraction, by estimation, is 60 to 65%. The left ventricle has normal function. The left ventricle has no regional wall motion abnormalities. Definity contrast agent was given IV to delineate the left  ventricular  endocardial borders. The left ventricular internal cavity size was normal in size. There is mild left ventricular hypertrophy of the basal-septal segment. Left ventricular diastolic parameters are consistent with Grade I diastolic dysfunction (impaired relaxation). Elevated left atrial pressure. Right Ventricle: The right ventricular size is normal. Right ventricular systolic function is normal. Left Atrium: Left atrial size was normal in size. Right Atrium: Right atrial size was normal in size. Pericardium: There is no evidence of pericardial effusion. Mitral Valve: The mitral valve is normal in structure. Trivial mitral valve regurgitation. No evidence of mitral valve stenosis. Tricuspid Valve: The tricuspid valve is normal in structure. Tricuspid valve regurgitation is trivial. No evidence of tricuspid stenosis. Aortic Valve: The aortic valve is tricuspid. Aortic valve regurgitation is not visualized. No aortic stenosis is present. Pulmonic Valve: The pulmonic valve was normal in structure. Pulmonic valve regurgitation is trivial. No evidence of pulmonic stenosis. Aorta: The aortic root is normal in size and structure. Venous: The inferior vena cava is normal in size with greater than 50% respiratory variability, suggesting right atrial pressure of 3 mmHg. IAS/Shunts: No atrial level shunt detected by color flow Doppler.  LEFT VENTRICLE PLAX 2D LVIDd:         4.10 cm     Diastology LVIDs:         3.00 cm     LV e' medial:    4.13 cm/s LV PW:         1.10 cm     LV E/e' medial:  21.4 LV IVS:        1.40 cm     LV e' lateral:   8.05 cm/s LVOT diam:     1.80 cm     LV E/e' lateral: 11.0 LV SV:         59 LV SV Index:   39 LVOT Area:     2.54 cm  LV Volumes (MOD) LV vol d, MOD A2C: 65.0 ml LV vol d, MOD A4C: 91.1 ml LV vol s, MOD A2C: 32.9 ml LV vol s, MOD A4C: 30.6 ml LV SV MOD A2C:     32.1 ml LV SV MOD A4C:     91.1 ml LV SV MOD BP:      45.1  ml RIGHT VENTRICLE             IVC RV Basal diam:  3.40 cm      IVC diam: 1.40 cm RV S prime:     10.40 cm/s TAPSE (M-mode): 1.6 cm LEFT ATRIUM             Index        RIGHT ATRIUM           Index LA diam:        3.70 cm 2.40 cm/m   RA Area:     10.20 cm LA Vol (A2C):   33.3 ml 21.64 ml/m  RA Volume:   20.30 ml  13.19 ml/m LA Vol (A4C):   40.5 ml 26.32 ml/m LA Biplane Vol: 40.4 ml 26.26 ml/m  AORTIC VALVE             PULMONIC VALVE LVOT Vmax:   100.00 cm/s PR End Diast Vel: 1.13 msec LVOT Vmean:  69.300 cm/s LVOT VTI:    0.233 m  AORTA Ao Root diam: 2.70 cm Ao Asc diam:  3.30 cm MITRAL VALVE                TRICUSPID VALVE MV Area (PHT): 3.72 cm     TR Peak grad:   13.2 mmHg MV Decel Time: 204 msec     TR Vmax:        182.00 cm/s MV E velocity: 88.30 cm/s MV A velocity: 107.00 cm/s  SHUNTS MV E/A ratio:  0.83         Systemic VTI:  0.23 m                             Systemic Diam: 1.80 cm Olga Millers MD Electronically signed by Olga Millers MD Signature Date/Time: 02/19/2023/1:14:49 PM    Final    CT CHEST ABDOMEN PELVIS W CONTRAST Result Date: 02/18/2023 CLINICAL DATA:  New diagnosis colon cancer, staging * Tracking Code: BO * EXAM: CT CHEST, ABDOMEN, AND PELVIS WITH CONTRAST TECHNIQUE: Multidetector CT imaging of the chest, abdomen and pelvis was performed following the standard protocol during bolus administration of intravenous contrast. RADIATION DOSE REDUCTION: This exam was performed according to the departmental dose-optimization program which includes automated exposure control, adjustment of the mA and/or kV according to patient size and/or use of iterative reconstruction technique. CONTRAST:  OMNIPAQUE IOHEXOL 300 MG/ML SOLN additional oral enteric contrast COMPARISON:  None Available. FINDINGS: CT CHEST FINDINGS Cardiovascular: Aortic atherosclerosis. Cardiomegaly. Three-vessel coronary artery calcifications status post median sternotomy and CABG. No pericardial effusion. Mediastinum/Nodes: No enlarged mediastinal, hilar, or axillary lymph  nodes. Moderate hiatal hernia with intrathoracic position of the gastric fundus. Thyroid gland, trachea, and esophagus demonstrate no significant findings. Lungs/Pleura: Lungs are clear. No pleural effusion or pneumothorax. Musculoskeletal: No chest wall abnormality. No acute osseous findings. CT ABDOMEN PELVIS FINDINGS Hepatobiliary: No solid liver abnormality is seen. No gallstones, gallbladder wall thickening, or biliary dilatation. Pancreas: Unremarkable. No pancreatic ductal dilatation or surrounding inflammatory changes. Spleen: Normal in size without significant abnormality. Adrenals/Urinary Tract: Adrenal glands are unremarkable. Kidneys are normal, without renal calculi, solid lesion, or hydronephrosis. Bladder is unremarkable. Stomach/Bowel: Stomach is within normal limits. Sessile mass of the cecum just superior to the ileocecal valve, approximately 4.8 x 3.5 cm (series 6, image 40, series 2, image 84). Sigmoid diverticulosis. Vascular/Lymphatic: Severe aortic atherosclerosis. Enlarged lymph nodes within the right lower quadrant mesocolon adjacent to  cecal mass, measuring up to 0.9 x 0.6 cm (series 2, image 80). Reproductive: No mass or other abnormality. Other: No abdominal wall hernia or abnormality. No ascites. Musculoskeletal: No acute osseous findings. IMPRESSION: 1. Sessile mass of the cecum just superior to the ileocecal valve, consistent with primary colon malignancy. 2. Enlarged lymph nodes within the right lower quadrant mesocolon adjacent to cecal mass, consistent with nodal metastases. 3. No evidence of distant metastatic disease in the chest, abdomen, or pelvis. 4. Sigmoid diverticulosis without evidence of acute diverticulitis. 5. Moderate hiatal hernia with intrathoracic position of the gastric fundus. 6. Cardiomegaly and coronary artery disease. Aortic Atherosclerosis (ICD10-I70.0). Electronically Signed   By: Jearld Lesch M.D.   On: 02/18/2023 17:11   DG Chest 2 View Result Date:  02/16/2023 CLINICAL DATA:  SOB EXAM: CHEST - 2 VIEW COMPARISON:  01/21/23. FINDINGS: Sternal wires. Post op changes. No consolidation, pneumothorax, effusion or edema. Calcified aorta. Bilateral apical pleural thickening. Degenerative changes along the spine. IMPRESSION: Post op chest.  Chronic changes. Electronically Signed   By: Karen Kays M.D.   On: 02/16/2023 15:04     Subjective: No complaints  Discharge Exam: Vitals:   02/26/23 1943 02/27/23 0455  BP: (!) 142/65 (!) 141/51  Pulse: 80 65  Resp: 15 20  Temp: 98.1 F (36.7 C) 98 F (36.7 C)  SpO2: 98% 94%   Vitals:   02/26/23 0526 02/26/23 1423 02/26/23 1943 02/27/23 0455  BP: (!) 153/60 (!) 130/52 (!) 142/65 (!) 141/51  Pulse: 88 70 80 65  Resp: 17 17 15 20   Temp: 98.6 F (37 C) 98.4 F (36.9 C) 98.1 F (36.7 C) 98 F (36.7 C)  TempSrc: Oral Oral Oral Oral  SpO2: 95% 93% 98% 94%  Weight:      Height:        General: Pt is alert, awake, not in acute distress Cardiovascular: RRR, S1/S2 +, no rubs, no gallops Respiratory: CTA bilaterally, no wheezing, no rhonchi Abdominal: Soft, NT, ND, bowel sounds + Extremities: no edema, no cyanosis    The results of significant diagnostics from this hospitalization (including imaging, microbiology, ancillary and laboratory) are listed below for reference.     Microbiology: Recent Results (from the past 240 hours)  Respiratory (~20 pathogens) panel by PCR     Status: Abnormal   Collection Time: 02/26/23  1:51 PM   Specimen: Nasopharyngeal Swab; Respiratory  Result Value Ref Range Status   Adenovirus NOT DETECTED NOT DETECTED Final   Coronavirus 229E NOT DETECTED NOT DETECTED Final    Comment: (NOTE) The Coronavirus on the Respiratory Panel, DOES NOT test for the novel  Coronavirus (2019 nCoV)    Coronavirus HKU1 NOT DETECTED NOT DETECTED Final   Coronavirus NL63 NOT DETECTED NOT DETECTED Final   Coronavirus OC43 NOT DETECTED NOT DETECTED Final   Metapneumovirus NOT  DETECTED NOT DETECTED Final   Rhinovirus / Enterovirus NOT DETECTED NOT DETECTED Final   Influenza A NOT DETECTED NOT DETECTED Final   Influenza B NOT DETECTED NOT DETECTED Final   Parainfluenza Virus 1 NOT DETECTED NOT DETECTED Final   Parainfluenza Virus 2 NOT DETECTED NOT DETECTED Final   Parainfluenza Virus 3 NOT DETECTED NOT DETECTED Final   Parainfluenza Virus 4 NOT DETECTED NOT DETECTED Final   Respiratory Syncytial Virus DETECTED (A) NOT DETECTED Final   Bordetella pertussis NOT DETECTED NOT DETECTED Final   Bordetella Parapertussis NOT DETECTED NOT DETECTED Final   Chlamydophila pneumoniae NOT DETECTED NOT DETECTED Final  Mycoplasma pneumoniae NOT DETECTED NOT DETECTED Final    Comment: Performed at [facility], [address], [place], [place] [address]     Labs: BNP (last 3 results) No results for input(s): "BNP" in the last 8760 hours. Basic Metabolic Panel: Recent Labs  Lab 02/22/23 0524 02/24/23 0447 02/25/23 0419 02/26/23 0430  NA 132* 135 134* 132*  K 3.6 3.1* 4.8 4.0  CL 100 105 102 97*  CO2 22 23 24 25   GLUCOSE 117* 93 109* 106*  BUN 9 6* 7* 10  CREATININE 0.86 0.57 0.64 0.72  CALCIUM 8.6* 8.4* 8.6* 8.7*  MG  --  1.5*  --   --    Liver Function Tests: No results for input(s): "AST", "ALT", "ALKPHOS", "BILITOT", "PROT", "ALBUMIN" in the last 168 hours.  No results for input(s): "LIPASE", "AMYLASE" in the last 168 hours. No results for input(s): "AMMONIA" in the last 168 hours. CBC: Recent Labs  Lab 02/22/23 0524 02/24/23 0447 02/25/23 0419 02/26/23 0430 02/27/23 0426  WBC 2.7* 7.8 5.3 5.0 4.8  NEUTROABS 1.5*  --   --   --   --   HGB 9.2* 7.9* 7.7* 7.5* 7.7*  HCT 30.4* 26.4* 26.0* 24.6* 25.1*  MCV 75.4* 74.8* 75.4* 74.5* 73.6*  PLT 281 240 282 295 328   Cardiac Enzymes: No results for input(s): "CKTOTAL", "CKMB", "CKMBINDEX", "TROPONINI" in the last 168 hours. BNP: Invalid input(s): "POCBNP" CBG: No results for input(s): "GLUCAP" in  the last 168 hours. D-Dimer No results for input(s): "DDIMER" in the last 72 hours. Hgb A1c No results for input(s): "HGBA1C" in the last 72 hours. Lipid Profile No results for input(s): "CHOL", "HDL", "LDLCALC", "TRIG", "CHOLHDL", "LDLDIRECT" in the last 72 hours. Thyroid function studies No results for input(s): "TSH", "T4TOTAL", "T3FREE", "THYROIDAB" in the last 72 hours.  Invalid input(s): "FREET3"  Anemia work up Recent Labs    02/26/23 1122  VITAMINB12 930*  FOLATE 13.6  FERRITIN 62  TIBC 363  IRON 30  RETICCTPCT 0.7   Urinalysis    Component Value Date/Time   COLORURINE YELLOW 09/15/2020 1634   APPEARANCEUR CLEAR 09/15/2020 1634   LABSPEC 1.014 09/15/2020 1634   PHURINE 5.5 09/15/2020 1634   GLUCOSEU NEGATIVE 09/15/2020 1634   HGBUR NEGATIVE 09/15/2020 1634   BILIRUBINUR NEGATIVE 02/08/2018 1515   KETONESUR NEGATIVE 09/15/2020 1634   PROTEINUR NEGATIVE 09/15/2020 1634   NITRITE POSITIVE (A) 02/08/2018 1515   LEUKOCYTESUR SMALL (A) 02/08/2018 1515   Sepsis Labs Recent Labs  Lab 02/24/23 0447 02/25/23 0419 02/26/23 0430 02/27/23 0426  WBC 7.8 5.3 5.0 4.8   Microbiology Recent Results (from the past 240 hours)  Respiratory (~20 pathogens) panel by PCR     Status: Abnormal   Collection Time: 02/26/23  1:51 PM   Specimen: Nasopharyngeal Swab; Respiratory  Result Value Ref Range Status   Adenovirus NOT DETECTED NOT DETECTED Final   Coronavirus 229E NOT DETECTED NOT DETECTED Final    Comment: (NOTE) The Coronavirus on the Respiratory Panel, DOES NOT test for the novel  Coronavirus (2019 nCoV)    Coronavirus HKU1 NOT DETECTED NOT DETECTED Final   Coronavirus NL63 NOT DETECTED NOT DETECTED Final   Coronavirus OC43 NOT DETECTED NOT DETECTED Final   Metapneumovirus NOT DETECTED NOT DETECTED Final   Rhinovirus / Enterovirus NOT DETECTED NOT DETECTED Final   Influenza A NOT DETECTED NOT DETECTED Final   Influenza B NOT DETECTED NOT DETECTED Final    Parainfluenza Virus 1 NOT DETECTED NOT  DETECTED Final   Parainfluenza Virus 2 NOT DETECTED NOT DETECTED Final   Parainfluenza Virus 3 NOT DETECTED NOT DETECTED Final   Parainfluenza Virus 4 NOT DETECTED NOT DETECTED Final   Respiratory Syncytial Virus DETECTED (A) NOT DETECTED Final   Bordetella pertussis NOT DETECTED NOT DETECTED Final   Bordetella Parapertussis NOT DETECTED NOT DETECTED Final   Chlamydophila pneumoniae NOT DETECTED NOT DETECTED Final   Mycoplasma pneumoniae NOT DETECTED NOT DETECTED Final    Comment: Performed at [facility], [address], [place], [place] [address]     Time coordinating discharge: Over 35 minutes  SIGNED:   Marinda Elk, MD  [facility] 02/27/2023, 11:58 AM Pager   If 7PM-7AM, please contact night-coverage [email or web address] Password [email or web address]

## 2023-02-27 NOTE — Progress Notes (Signed)
5 Days Post-Op   Subjective/Chief Complaint: Cough, rsv on swab, having bowel function, tol diet   Objective: Vital signs in last 24 hours: Temp:  [98 F (36.7 C)-98.4 F (36.9 C)] 98 F (36.7 C) (02/02 0455) Pulse Rate:  [65-80] 65 (02/02 0455) Resp:  [15-20] 20 (02/02 0455) BP: (130-142)/(51-65) 141/51 (02/02 0455) SpO2:  [93 %-98 %] 94 % (02/02 0455) Last BM Date : 02/26/23  Intake/Output from previous day: 02/01 0701 - 02/02 0700 In: 120 [P.O.:120] Out: -  Intake/Output this shift: No intake/output data recorded.  Ab soft approp tender dressing dry  Lab Results:  Recent Labs    02/26/23 0430 02/27/23 0426  WBC 5.0 4.8  HGB 7.5* 7.7*  HCT 24.6* 25.1*  PLT 295 328   BMET Recent Labs    02/25/23 0419 02/26/23 0430  NA 134* 132*  K 4.8 4.0  CL 102 97*  CO2 24 25  GLUCOSE 109* 106*  BUN 7* 10  CREATININE 0.64 0.72  CALCIUM 8.6* 8.7*   PT/INR No results for input(s): "LABPROT", "INR" in the last 72 hours. ABG No results for input(s): "PHART", "HCO3" in the last 72 hours.  Invalid input(s): "PCO2", "PO2"  Studies/Results: DG CHEST PORT 1 VIEW Result Date: 02/26/2023 CLINICAL DATA:  Cough. EXAM: PORTABLE CHEST 1 VIEW COMPARISON:  July 17, 2023. FINDINGS: The heart size and mediastinal contours are within normal limits. Sternotomy wires are noted. Both lungs are clear. The visualized skeletal structures are unremarkable. IMPRESSION: No active disease. Electronically Signed   By: Lupita Raider M.D.   On: 02/26/2023 15:55    Anti-infectives: Anti-infectives (From admission, onward)    Start     Dose/Rate Route Frequency Ordered Stop   02/25/23 1030  amoxicillin-clavulanate (AUGMENTIN) 875-125 MG per tablet 1 tablet        1 tablet Oral Every 12 hours 02/25/23 0931 03/02/23 0959   02/25/23 0000  amoxicillin-clavulanate (AUGMENTIN) 875-125 MG tablet        1 tablet Oral Every 12 hours 02/25/23 0931 03/02/23 2359   02/22/23 0600  cefoTEtan (CEFOTAN) 2 g  in sodium chloride 0.9 % 100 mL IVPB        2 g 200 mL/hr over 30 Minutes Intravenous On call to O.R. 02/21/23 1610 02/22/23 0940   02/21/23 1400  neomycin (MYCIFRADIN) tablet 1,000 mg       Placed in "And" Linked Group   1,000 mg Oral 3 times per day 02/21/23 0851 02/21/23 2131   02/21/23 1400  metroNIDAZOLE (FLAGYL) tablet 1,000 mg       Placed in "And" Linked Group   1,000 mg Oral 3 times per day 02/21/23 0851 02/21/23 2130       Assessment/Plan: POD 5 laparoscopic right hemicolectomy 1/28, Dr. Fredricka Bonine.  -tol soft diet -ready for dc surgically  -fine to restart DOAC, hb low but stable, no evidence bleeding  RSV/cough- per TRH   FEN - soft VTE - Lovenox   Emelia Loron 02/27/2023

## 2023-02-27 NOTE — Plan of Care (Signed)

## 2023-03-04 ENCOUNTER — Encounter: Payer: Self-pay | Admitting: *Deleted

## 2023-03-04 NOTE — Progress Notes (Signed)
 BRAF testing added to accession number [id number]

## 2023-03-10 ENCOUNTER — Ambulatory Visit: Payer: Medicare HMO | Admitting: Oncology

## 2023-03-15 ENCOUNTER — Telehealth: Payer: Self-pay

## 2023-03-15 LAB — MOLECULAR PATHOLOGY

## 2023-03-15 NOTE — Telephone Encounter (Signed)
Called and left message for patient to come in at 930 instead of 1140.  Will await call back to confirm. Lorayne Marek, RN

## 2023-03-15 NOTE — Telephone Encounter (Signed)
Delice Bison called back and will have her mother here tomorrow at 930. Lorayne Marek, RN

## 2023-03-15 NOTE — Telephone Encounter (Signed)
Called patient to move her new patient appt on Thrusday afternoon to Wednesday at 1140. She is calling her daughter to see if she can move to this day.  Daughter Debra Fry called me back and can have her here tomorrow. Inquired about labs and will ask Dr. Truett Perna. Lorayne Marek, RN

## 2023-03-16 ENCOUNTER — Inpatient Hospital Stay: Payer: Medicare HMO

## 2023-03-16 ENCOUNTER — Encounter: Payer: Self-pay | Admitting: *Deleted

## 2023-03-16 ENCOUNTER — Inpatient Hospital Stay: Payer: Medicare HMO | Attending: Oncology | Admitting: Oncology

## 2023-03-16 VITALS — BP 114/52 | HR 74 | Temp 98.1°F | Resp 18 | Ht 62.0 in | Wt 110.9 lb

## 2023-03-16 DIAGNOSIS — E785 Hyperlipidemia, unspecified: Secondary | ICD-10-CM | POA: Diagnosis not present

## 2023-03-16 DIAGNOSIS — D509 Iron deficiency anemia, unspecified: Secondary | ICD-10-CM | POA: Diagnosis not present

## 2023-03-16 DIAGNOSIS — C182 Malignant neoplasm of ascending colon: Secondary | ICD-10-CM | POA: Insufficient documentation

## 2023-03-16 DIAGNOSIS — I2510 Atherosclerotic heart disease of native coronary artery without angina pectoris: Secondary | ICD-10-CM | POA: Insufficient documentation

## 2023-03-16 LAB — CBC WITH DIFFERENTIAL (CANCER CENTER ONLY)
Abs Immature Granulocytes: 0.01 10*3/uL (ref 0.00–0.07)
Basophils Absolute: 0 10*3/uL (ref 0.0–0.1)
Basophils Relative: 0 %
Eosinophils Absolute: 0.1 10*3/uL (ref 0.0–0.5)
Eosinophils Relative: 2 %
HCT: 31 % — ABNORMAL LOW (ref 36.0–46.0)
Hemoglobin: 9.2 g/dL — ABNORMAL LOW (ref 12.0–15.0)
Immature Granulocytes: 0 %
Lymphocytes Relative: 22 %
Lymphs Abs: 1.4 10*3/uL (ref 0.7–4.0)
MCH: 22.8 pg — ABNORMAL LOW (ref 26.0–34.0)
MCHC: 29.7 g/dL — ABNORMAL LOW (ref 30.0–36.0)
MCV: 76.9 fL — ABNORMAL LOW (ref 80.0–100.0)
Monocytes Absolute: 0.6 10*3/uL (ref 0.1–1.0)
Monocytes Relative: 9 %
Neutro Abs: 4.4 10*3/uL (ref 1.7–7.7)
Neutrophils Relative %: 67 %
Platelet Count: 308 10*3/uL (ref 150–400)
RBC: 4.03 MIL/uL (ref 3.87–5.11)
RDW: 25 % — ABNORMAL HIGH (ref 11.5–15.5)
WBC Count: 6.5 10*3/uL (ref 4.0–10.5)
nRBC: 0 % (ref 0.0–0.2)

## 2023-03-16 NOTE — Progress Notes (Signed)
[facility] New Patient Consult   Requesting MD: Berna Bue, Md [address] [place],  [place] [address]   Debra Fry 83 y.o.  [birth year]    Reason for Consult: Colon cancer   HPI: Debra Fry presented on 01/21/2023 with exertional dyspnea, palpitations, right shoulder blade pain, and dizziness.  The hemoglobin returned at 5.1.  The ferritin returned at 3. She was admitted and transfused with 2 units of packed red blood cells.  Gastroenterology was consulted.  An upper endoscopy revealed a nonbleeding gastric ulcer and gastritis.  She was discharged home on 01/23/2023. She presented emergency room on 02/15/2023 with fatigue and dyspnea.  The hemoglobin returned at 9.8.  She was admitted and transfused with 1 unit of packed red blood cells.  Gastroenterology was consulted.  A repeat upper endoscopy 02/18/2023 found gastritis.  A colonoscopy 02/18/2023 revealed a nonobstructing mass in the proximal ascending colon and at the ileocecal valve.  A polyp was removed from the FLOX of transverse colon.  The right-sided colon mass was biopsied and the area was tattooed.  The pathology from the ascending mass revealed invasive moderately differentiated adenocarcinoma.  The transverse polyp returned as a tubular adenoma.  The surgical service was consulted.  She was taken the operating room by Dr. Fredricka Bonine on 02/22/2023 for a laparoscopic right hemicolectomy.  There was no evidence of metastatic disease on the liver or peritoneal surfaces.  An ileocolic side-to-side anastomosis was created.  She was discharged to home 02/25/2023.  Debra Fry is referred for oncology evaluation.  She reports feeling better following surgery.  She is taking iron.  She is tolerating the iron well. Past Medical History:  Diagnosis Date   Coronary artery disease    GERD (gastroesophageal reflux disease)    Headache    History of hiatal hernia    Hyperlipidemia    Myocardial infarction  (HCC) 12/1999   Stroke (HCC)     .  G3, P3  Past Surgical History:  Procedure Laterality Date   BIOPSY  01/23/2023   Procedure: BIOPSY;  Surgeon: Charlott Rakes, MD;  Location: [facility];  Service: Gastroenterology;;   BIOPSY  02/18/2023   Procedure: BIOPSY;  Surgeon: Kathi Der, MD;  Location: [facility];  Service: Gastroenterology;;   CARDIAC CATHETERIZATION  2001   results in Echart conversion   COLONOSCOPY WITH PROPOFOL N/A 02/18/2023   Procedure: COLONOSCOPY WITH PROPOFOL;  Surgeon: Kathi Der, MD;  Location: [facility];  Service: Gastroenterology;  Laterality: N/A;   CORONARY ARTERY BYPASS GRAFT  2001   ENDARTERECTOMY Left 02/13/2018   Procedure: ENDARTERECTOMY CAROTID;  Surgeon: Sherren Kerns, MD;  Location: [facility] OR;  Service: Vascular;  Laterality: Left;   ESOPHAGOGASTRODUODENOSCOPY (EGD) WITH PROPOFOL N/A 01/23/2023   Procedure: ESOPHAGOGASTRODUODENOSCOPY (EGD) WITH PROPOFOL;  Surgeon: Charlott Rakes, MD;  Location: [facility];  Service: Gastroenterology;  Laterality: N/A;   ESOPHAGOGASTRODUODENOSCOPY (EGD) WITH PROPOFOL N/A 02/18/2023   Procedure: ESOPHAGOGASTRODUODENOSCOPY (EGD) WITH PROPOFOL;  Surgeon: Kathi Der, MD;  Location: [facility];  Service: Gastroenterology;  Laterality: N/A;   LAPAROSCOPIC PARTIAL RIGHT COLECTOMY Right 02/22/2023   Procedure: LAPAROSCOPIC PARTIAL RIGHT COLECTOMY;  Surgeon: Berna Bue, MD;  Location: [facility];  Service: General;  Laterality: Right;   OPEN REDUCTION INTERNAL FIXATION (ORIF) DISTAL RADIAL FRACTURE Left 11/23/2018   Procedure: OPEN REDUCTION INTERNAL FIXATION (ORIF) LEFT DISTAL RADIAL AND ULNA  FRACTURES;  Surgeon: Betha Loa, MD;  Location: [facility];  Service: Orthopedics;  Laterality:  Left;   POLYPECTOMY  02/18/2023   Procedure: POLYPECTOMY;  Surgeon: Kathi Der, MD;  Location: [facility];  Service: Gastroenterology;;   SUBMUCOSAL TATTOO INJECTION  02/18/2023    Procedure: SUBMUCOSAL TATTOO INJECTION;  Surgeon: Kathi Der, MD;  Location: [facility];  Service: Gastroenterology;;    Medications: Reviewed  Allergies:  Allergies  Allergen Reactions   Atorvastatin     Knee pain    Family history: No family history of cancer  Social History:   She lives alone in [place].  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  She does not use cigarettes.  No alcohol use.  She was transfused packed red blood cells in December 2024 and January 2025.  No risk factor for HIV or hepatitis.  ROS:   Positives include: Occasional night sweats, constipation/diarrhea prior to the right colectomy  A complete ROS was otherwise negative.  Physical Exam:  Blood pressure (!) 114/52, pulse 74, temperature 98.1 F (36.7 C), temperature source Temporal, resp. rate 18, height 5\' 2"  (1.575 m), weight 110 lb 14.4 oz (50.3 kg), SpO2 98%.  HEENT: Upper and lower denture plate, oropharynx without visible mass Lungs: Clear bilaterally Cardiac: Regular rate and rhythm Abdomen: Healed midline incision, no hepatosplenomegaly, mild tenderness at the right subcostal region, no mass  Vascular: No leg edema Lymph nodes: No cervical, supraclavicular, axillary, or inguinal nodes Neurologic: Alert and oriented, the motor exam appears intact in the upper and lower extremities bilaterally Skin: No rash Musculoskeletal: No spine tenderness   LAB:  CBC  Lab Results  Component Value Date   WBC 6.5 03/16/2023   HGB 9.2 (L) 03/16/2023   HCT 31.0 (L) 03/16/2023   MCV 76.9 (L) 03/16/2023   PLT 308 03/16/2023   NEUTROABS 4.4 03/16/2023        CMP  Lab Results  Component Value Date   NA 132 (L) 02/26/2023   K 4.0 02/26/2023   CL 97 (L) 02/26/2023   CO2 25 02/26/2023   GLUCOSE 106 (H) 02/26/2023   BUN 10 02/26/2023   CREATININE 0.72 02/26/2023   CALCIUM 8.7 (L) 02/26/2023   PROT 7.5 02/19/2023   ALBUMIN 3.2 (L) 02/19/2023   AST 19  02/19/2023   ALT 14 02/19/2023   ALKPHOS 63 02/19/2023   BILITOT 1.4 (H) 02/19/2023   GFRNONAA >60 02/26/2023   GFRAA >60 09/25/2019     Lab Results  Component Value Date   CEA1 0.8 02/18/2023    Imaging:  CT images from 02/18/2023 reviewed    Assessment/Plan:   Ascending colon cancer, 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, stage II (pT3 pN0) Moderate to poorly differentiated adenocarcinoma with focal mucinous features, tumor focally invades pericolonic adipose tissue, no lymphovascular perineural invasion, negative margins, 0/21 nodes, MSI high, loss of MLH1 and PMS2 expression, BRAF V600E, MLH1 hypermethylation present Colonoscopy 02/18/2023-nonobstructing mass in the proximal ascending colon and at the ileocecal valve-invasive moderately differentiated adenocarcinoma CTs 02/18/2023-sessile mass of the cecum, enlarged lymph node in the right lower quadrant mesocolon adjacent to the cecal mass, no evidence of distant metastatic disease Tubular adenoma of the transverse colon colonoscopy 02/18/2023 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 Figge has been diagnosed with adenocarcinoma of the ascending colon.  She underwent a right  colectomy for treatment of a stage II colon cancer.  I reviewed details of the surgery pathology report, the prognosis, and adjuvant treatment options of this In[place] and her daughter.  The tumor does not have "high risk "features seen in some patients with stage II colon cancer.  She has a good prognosis for a long-term disease-free survival.  I do not recommend adjuvant chemotherapy in her case.  The tumor returned MSI high with loss of MLH1 and PMS2 expression  most consistent with a sporadic tumor.  MLH1 hyper methylation and a BRAF alteration were detected, confirming the sporadic nature of the test.  The BRAF alteration can predict for an aggressive, but tumor does not have other high risk features and the MSI high status predicts for an improved prognosis.  Patients with MSI high tumors do not benefit from single agent adjuvant 5-fluorouracil chemotherapy.  She does not appear to have hereditary nonpolyposis colon cancer syndrome, but her family members are at increased risk of developing colorectal cancer and should receive appropriate screening.  Mr Cauble presented with symptomatic anemia.  The hemoglobin remains low when she was discharged from the hospital earlier this month.  She will return to the lab for a CBC today.  Ms. Bowe we will follow-up with Dr. Levora Angel to consider the indication for a surveillance colonoscopy in 1 year.  She will return here for for an office visit and CEA in 6 months.  Thornton Papas, MD  03/16/2023, 5:34 PM

## 2023-03-16 NOTE — Progress Notes (Signed)
PATIENT NAVIGATOR PROGRESS NOTE  Name: Debra Fry Date: 03/16/2023 MRN: [id number]  DOB: 20-Jul-1940   Reason for visit:  New patient appt  Comments:  Met with Debra Fry and her daughter Debra Fry during visit with Dr Truett Perna  Debra Fry is recovering well from surgery and eating regularly, does not wish for nutrition referral Dr Truett Perna does not recommend treatment for Stage II colon cancer that has no high risk features He recommends F/U visit in 6 months with CEA level check Pt given written information on colon cancer and contact information to call with any questions or concerns    Time spent counseling/coordinating care: > 60 minutes

## 2023-03-16 NOTE — Progress Notes (Signed)
Called and spoke with daughter Delice Bison regarding CBC results, hgb improved to 9.2  Dr Truett Perna advises to continue Ferrous Sulfate and have CBC checked in 2-3 months

## 2023-03-17 ENCOUNTER — Ambulatory Visit: Payer: Medicare HMO | Admitting: Oncology

## 2023-09-15 ENCOUNTER — Other Ambulatory Visit: Payer: Medicare HMO

## 2023-09-15 ENCOUNTER — Ambulatory Visit: Payer: Medicare HMO | Admitting: Oncology

## 2023-10-03 ENCOUNTER — Inpatient Hospital Stay: Attending: Oncology

## 2023-10-03 ENCOUNTER — Ambulatory Visit: Payer: Self-pay | Admitting: Oncology

## 2023-10-03 ENCOUNTER — Other Ambulatory Visit: Payer: Self-pay | Admitting: *Deleted

## 2023-10-03 ENCOUNTER — Inpatient Hospital Stay: Admitting: Oncology

## 2023-10-03 VITALS — BP 134/64 | HR 62 | Temp 97.8°F | Resp 18 | Ht 62.0 in | Wt 119.1 lb

## 2023-10-03 DIAGNOSIS — E538 Deficiency of other specified B group vitamins: Secondary | ICD-10-CM | POA: Diagnosis not present

## 2023-10-03 DIAGNOSIS — C182 Malignant neoplasm of ascending colon: Secondary | ICD-10-CM

## 2023-10-03 DIAGNOSIS — D509 Iron deficiency anemia, unspecified: Secondary | ICD-10-CM | POA: Diagnosis not present

## 2023-10-03 DIAGNOSIS — Z85038 Personal history of other malignant neoplasm of large intestine: Secondary | ICD-10-CM | POA: Insufficient documentation

## 2023-10-03 LAB — CEA (ACCESS): CEA (CHCC): <1 ng/mL (ref 0.00–5.00)

## 2023-10-03 MED ORDER — GABAPENTIN 300 MG PO CAPS
300.0000 mg | ORAL_CAPSULE | Freq: Three times a day (TID) | ORAL | 0 refills | Status: AC | PRN
Start: 2023-10-03 — End: ?

## 2023-10-03 NOTE — Telephone Encounter (Signed)
 Notified Ms. Rohman that MD approved a one time fill on her gabapentin . Neurology will need to f/u

## 2023-10-03 NOTE — Progress Notes (Signed)
  [facility] OFFICE PROGRESS NOTE   Diagnosis: Colon cancer  INTERVAL HISTORY:   Debra Fry returns as scheduled.  She feels well.  Good appetite.  Energy level.  She reports nausea and episodes of vomiting prior to having a bowel movement.  This occurs intermittently.  No bleeding.  She is taking no medications.  She would like to have her medications for trigeminal neuralgia refilled.  Objective:  Vital signs in last 24 hours:  Blood pressure 134/64, pulse 62, temperature 97.8 F (36.6 C), temperature source Temporal, resp. rate 18, height 5' 2 (1.575 m), weight 119 lb 1.6 oz (54 kg), SpO2 99%.    Lymphatics: No cervical, supraclavicular, axillary, or inguinal nodes Resp: Lungs clear bilaterally Cardio: Regular rate and rhythm GI: Nontender, no mass, no hepatosplenomegaly Vascular: No leg edema   Lab Results:  Lab Results  Component Value Date   WBC 6.5 03/16/2023   HGB 9.2 (L) 03/16/2023   HCT 31.0 (L) 03/16/2023   MCV 76.9 (L) 03/16/2023   PLT 308 03/16/2023   NEUTROABS 4.4 03/16/2023    CMP  Lab Results  Component Value Date   NA 132 (L) 02/26/2023   K 4.0 02/26/2023   CL 97 (L) 02/26/2023   CO2 25 02/26/2023   GLUCOSE 106 (H) 02/26/2023   BUN 10 02/26/2023   CREATININE 0.72 02/26/2023   CALCIUM  8.7 (L) 02/26/2023   PROT 7.5 02/19/2023   ALBUMIN 3.2 (L) 02/19/2023   AST 19 02/19/2023   ALT 14 02/19/2023   ALKPHOS 63 02/19/2023   BILITOT 1.4 (H) 02/19/2023   GFRNONAA >60 02/26/2023   GFRAA >60 09/25/2019    Lab Results  Component Value Date   CEA1 0.8 02/18/2023    Medications: I have reviewed the patient's current medications.   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, stage II (pT3 pN0) Moderate to poorly differentiated adenocarcinoma with focal mucinous features, tumor focally invades pericolonic adipose tissue, no lymphovascular perineural invasion, negative margins, 0/21 nodes, MSI  high, loss of MLH1 and PMS2 expression, BRAF V600E, MLH1 hypermethylation present Colonoscopy 02/18/2023-nonobstructing mass in the proximal ascending colon and at the ileocecal valve-invasive moderately differentiated adenocarcinoma CTs 02/18/2023-sessile mass of the cecum, enlarged lymph node in the right lower quadrant mesocolon adjacent to the cecal mass, no evidence of distant metastatic disease Tubular adenoma of the transverse colon colonoscopy 02/18/2023 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  We will follow-up on the CEA from today.  She will return for an office visit and CEA in 6 months.  I recommended she follow-up with Dr. Elicia if the episodes of nausea persist.  She will discuss the indication for a surveillance colonoscopy with Dr. Elicia.  Her daughter inquired about her hemoglobin.  She does not have symptoms of anemia, but remained anemic when she was here in February.  Debra Fry did not wish to have the hemoglobin checked today.  He plans to establish care with a primary provider and have her hemoglobin checked there.  Arley Hof, MD  10/03/2023  10:05 AM

## 2023-10-04 ENCOUNTER — Other Ambulatory Visit: Payer: Self-pay | Admitting: *Deleted

## 2023-10-04 DIAGNOSIS — C182 Malignant neoplasm of ascending colon: Secondary | ICD-10-CM

## 2023-10-04 NOTE — Telephone Encounter (Signed)
-----   Message from Arley Hof sent at 10/03/2023  5:51 PM EDT ----- Please call patient, the CEA is normal, follow-up as scheduled  ----- Message ----- From: Rebecka, Lab In [place] Sent: 10/03/2023  11:06 AM EDT To: Arley KATHEE Hof, MD

## 2023-10-04 NOTE — Telephone Encounter (Signed)
 Notified that CEA is normal via VM

## 2023-10-05 ENCOUNTER — Encounter: Payer: Self-pay | Admitting: *Deleted

## 2023-10-05 NOTE — Progress Notes (Signed)
 Faxed referral order, demographics and medical records to [facility] [phone] per Dr. Cloretta order for management of trigeminal neuralgia

## 2023-11-02 ENCOUNTER — Other Ambulatory Visit: Payer: Self-pay | Admitting: Oncology

## 2023-11-03 ENCOUNTER — Encounter: Payer: Self-pay | Admitting: *Deleted

## 2023-11-03 NOTE — Progress Notes (Signed)
 Notified [facility] that MD will not refill the gabapentin . Last fill said that all future refills per neurology.

## 2023-11-05 ENCOUNTER — Other Ambulatory Visit: Payer: Self-pay

## 2023-11-05 ENCOUNTER — Encounter (HOSPITAL_COMMUNITY): Payer: Self-pay | Admitting: *Deleted

## 2023-11-05 ENCOUNTER — Emergency Department (HOSPITAL_COMMUNITY)
Admission: EM | Admit: 2023-11-05 | Discharge: 2023-11-05 | Disposition: A | Discharge status: Home / Self Care | Attending: Emergency Medicine | Admitting: Emergency Medicine

## 2023-11-05 DIAGNOSIS — H1031 Unspecified acute conjunctivitis, right eye: Secondary | ICD-10-CM | POA: Insufficient documentation

## 2023-11-05 DIAGNOSIS — H5711 Ocular pain, right eye: Secondary | ICD-10-CM | POA: Diagnosis present

## 2023-11-05 LAB — CBC WITH DIFFERENTIAL/PLATELET
Abs Immature Granulocytes: 0 K/uL (ref 0.00–0.07)
Basophils Absolute: 0 K/uL (ref 0.0–0.1)
Basophils Relative: 1 %
Eosinophils Absolute: 0.1 K/uL (ref 0.0–0.5)
Eosinophils Relative: 1 %
HCT: 38.9 % (ref 36.0–46.0)
Hemoglobin: 13 g/dL (ref 12.0–15.0)
Immature Granulocytes: 0 %
Lymphocytes Relative: 33 %
Lymphs Abs: 1.5 K/uL (ref 0.7–4.0)
MCH: 30 pg (ref 26.0–34.0)
MCHC: 33.4 g/dL (ref 30.0–36.0)
MCV: 89.8 fL (ref 80.0–100.0)
Monocytes Absolute: 0.4 K/uL (ref 0.1–1.0)
Monocytes Relative: 9 %
Neutro Abs: 2.5 K/uL (ref 1.7–7.7)
Neutrophils Relative %: 56 %
Platelets: 126 K/uL — ABNORMAL LOW (ref 150–400)
RBC: 4.33 MIL/uL (ref 3.87–5.11)
RDW: 13.4 % (ref 11.5–15.5)
WBC: 4.4 K/uL (ref 4.0–10.5)
nRBC: 0 % (ref 0.0–0.2)

## 2023-11-05 LAB — BASIC METABOLIC PANEL WITH GFR
Anion gap: 12 (ref 5–15)
BUN: 17 mg/dL (ref 8–23)
CO2: 23 mmol/L (ref 22–32)
Calcium: 9.4 mg/dL (ref 8.9–10.3)
Chloride: 103 mmol/L (ref 98–111)
Creatinine, Ser: 0.64 mg/dL (ref 0.44–1.00)
GFR, Estimated: >60 mL/min (ref >60)
Glucose, Bld: 92 mg/dL (ref 70–99)
Potassium: 3.7 mmol/L (ref 3.5–5.1)
Sodium: 138 mmol/L (ref 135–145)

## 2023-11-05 MED ORDER — POLYMYXIN B-TRIMETHOPRIM 10000-0.1 UNIT/ML-% OP SOLN: 1.0000 [drp] | OPHTHALMIC | 0 refills | Status: DC

## 2023-11-05 NOTE — ED Provider Notes (Signed)
 [place] EMERGENCY DEPARTMENT AT [facility] Provider Note   CSN: [id number] Arrival date & time: 11/05/23  1355     Patient presents with: Eye Pain   Debra Fry is a 83 y.o. female.   83 yo F with a chief complaints of right eyelid pain and swelling.  She actually thinks it is a little bit better, started a few days ago.  She denies any injury to the eye.  Denies any new products applied to it.  Has been using warm compresses with some improvement.  Denies any change in the vision denies pain to the eye.   Eye Pain       Prior to Admission medications   Medication Sig Start Date End Date Taking? Authorizing Provider  trimethoprim-polymyxin b (POLYTRIM) ophthalmic solution Place 1 drop into the right eye every 4 (four) hours. 11/05/23  Yes Emil Share, DO  acetaminophen  (TYLENOL ) 500 MG tablet Take 2 tablets (1,000 mg total) by mouth every 6 (six) hours as needed. 02/25/23   Tammy Sor, PA-C  calcium  carbonate (TUMS - DOSED IN MG ELEMENTAL CALCIUM ) 500 MG chewable tablet Chew 1 tablet by mouth daily as needed for indigestion or heartburn.    [provider]  carbamazepine  (CARBATROL ) 100 MG 12 hr capsule Take 100 mg by mouth daily as needed (acute [place] flare ups). Patient not taking: Reported on 10/03/2023    [provider]  cholecalciferol  (CHOLECALCIFEROL ) 25 MCG tablet Take 2 tablets (2,000 Units total) by mouth daily. Patient not taking: Reported on 10/03/2023 01/23/23   Cheryle Page, MD  cyanocobalamin  (VITAMIN B12) 1000 MCG tablet Take 1 tablet (1,000 mcg total) by mouth daily. Patient not taking: Reported on 10/03/2023 01/23/23   Cheryle Page, MD  ferrous sulfate  325 (65 FE) MG tablet Take 1 tablet (325 mg total) by mouth daily with breakfast. Patient not taking: Reported on 10/03/2023 02/28/23   Odell Celinda Balo, MD  gabapentin  (NEURONTIN ) 300 MG capsule Take 1 capsule (300 mg total) by mouth 3 (three) times daily as needed. Future  refills per neurology please 10/03/23   Cloretta Arley NOVAK, MD  oxyCODONE  (OXY IR/ROXICODONE ) 5 MG immediate release tablet Take 1 tablet (5 mg total) by mouth every 6 (six) hours as needed (pain). Patient not taking: Reported on 10/03/2023 02/25/23   Tammy Sor, PA-C  pantoprazole  (PROTONIX ) 40 MG tablet Take 1 tablet (40 mg total) by mouth 2 (two) times daily before a meal. GI recommending Protonix  twice a day for 3 months then once a day Patient not taking: Reported on 10/03/2023 01/23/23 04/23/23  Cheryle Page, MD    Allergies: Atorvastatin     Review of Systems  Eyes:  Positive for pain.    Updated Vital Signs BP (!) 168/66   Pulse 68   Temp 98.1 F (36.7 C) (Oral)   Resp 16   Ht 5' 2 (1.575 m)   Wt 54 kg   SpO2 100%   BMI 21.77 kg/m   Physical Exam Vitals and nursing note reviewed.  Constitutional:      General: She is not in acute distress.    Appearance: She is well-developed. She is not diaphoretic.  HENT:     Head: Normocephalic and atraumatic.  Eyes:     Pupils: Pupils are equal, round, and reactive to light.     Comments: Conjunctival injection watery appearing drainage erythema to the upper lid.  Cardiovascular:     Rate and Rhythm: Normal rate and regular rhythm.  Heart sounds: No murmur heard.    No friction rub. No gallop.  Pulmonary:     Effort: Pulmonary effort is normal.     Breath sounds: No wheezing or rales.  Abdominal:     General: There is no distension.     Palpations: Abdomen is soft.     Tenderness: There is no abdominal tenderness.  Musculoskeletal:        General: No tenderness.     Cervical back: Normal range of motion and neck supple.  Skin:    General: Skin is warm and dry.  Neurological:     Mental Status: She is alert and oriented to person, place, and time.  Psychiatric:        Behavior: Behavior normal.     (all labs ordered are listed, but only abnormal results are displayed) Labs Reviewed  CBC WITH DIFFERENTIAL/PLATELET  - Abnormal; Notable for the following components:      Result Value   Platelets 126 (*)    All other components within normal limits  BASIC METABOLIC PANEL WITH GFR    EKG: None  Radiology: No results found.   Procedures   Medications Ordered in the ED - No data to display                                  Medical Decision Making Amount and/or Complexity of Data Reviewed Labs: ordered.  Risk Prescription drug management.   83 yo F with a chief complaints of swollen right upper eyelid.  This has been going on for a couple days.  She thinks it is improving.  No visual complaints.  Clinically the patient has conjunctivitis.  Will start on topical antibiotics.  Patient is worried that she may be losing blood.  Tells me that she has been fatigued.  Would like to have her blood level checked.  Blood work without anemia, no electrolyte abnormalities.  Will discharge home.  Topical antibiotic drops.  Ophthalmology follow-up.  7:38 PM:  I have discussed the diagnosis/risks/treatment options with the patient.  Evaluation and diagnostic testing in the emergency department does not suggest an emergent condition requiring admission or immediate intervention beyond what has been performed at this time.  They will follow up with PCP, optho. We also discussed returning to the ED immediately if new or worsening sx occur. We discussed the sx which are most concerning (e.g., sudden worsening pain, fever, inability to tolerate by mouth) that necessitate immediate return. Medications administered to the patient during their visit and any new prescriptions provided to the patient are listed below.  Medications given during this visit Medications - No data to display   The patient appears reasonably screen and/or stabilized for discharge and I doubt any other medical condition or other [facility] requiring further screening, evaluation, or treatment in the ED at this time prior to discharge.        Final diagnoses:  Acute bacterial conjunctivitis of right eye    ED Discharge Orders          Ordered    trimethoprim-polymyxin b (POLYTRIM) ophthalmic solution  Every 4 hours        11/05/23 1926               Peony Barner, DO 11/05/23 1938

## 2023-11-05 NOTE — ED Triage Notes (Signed)
 Pt states swelling and burning to R upper lid x 2 days.  She also states she slept all day she is so tired.  Denies headache or change in vision.

## 2023-11-05 NOTE — ED Provider Triage Note (Signed)
 Emergency Medicine Provider Triage Evaluation Note  Debra Fry See , a 83 y.o. female  was evaluated in triage.  Pt complains of right eye irritation. Reports that she felt well Thursday night and then woke up Friday morning with right eye irritation. Reports the upper lid was swollen as well. Used warm compresses with improvement, also had some crusting around her eye when she woke up. Denies vision changes. No painful eye movements. No fevers or chills. No increased tearing or drainage.   Review of Systems  Positive:  Negative:   Physical Exam  BP (!) 177/63 (BP Location: Right Arm)   Pulse 78   Temp 98.1 F (36.7 C)   Resp 17   Ht 5' 2 (1.575 m)   Wt 54 kg   SpO2 98%   BMI 21.77 kg/m  Gen:   Awake, no distress   Resp:  Normal effort  MSK:   Moves extremities without difficulty  Other:  Mild conjunctival erythema and upper eyelid erythema. No swelling.  No chemosis or proptosis.  PERRLA and EOMs are intact without pain.  Medical Decision Making  Medically screening exam initiated at 3:02 PM.  Appropriate orders placed.  Alfonse Gaskins Swier was informed that the remainder of the evaluation will be completed by another provider, this initial triage assessment does not replace that evaluation, and the importance of remaining in the ED until their evaluation is complete.     Nora Lauraine LABOR, PA-C 11/05/23 1507

## 2023-11-05 NOTE — Discharge Instructions (Signed)
 Continue warm compresses.  Please follow-up with your ophthalmologist.  I have given you information for the ophthalmologist on-call if you would like to see them in clinic.

## 2023-11-05 NOTE — ED Triage Notes (Signed)
 Pt has been completing compresses without relief and notices crusting in the morning.

## 2024-01-04 ENCOUNTER — Ambulatory Visit (HOSPITAL_BASED_OUTPATIENT_CLINIC_OR_DEPARTMENT_OTHER): Admitting: Family Medicine

## 2024-01-05 ENCOUNTER — Encounter (HOSPITAL_BASED_OUTPATIENT_CLINIC_OR_DEPARTMENT_OTHER): Payer: Self-pay | Admitting: Family Medicine

## 2024-01-05 ENCOUNTER — Ambulatory Visit (INDEPENDENT_AMBULATORY_CARE_PROVIDER_SITE_OTHER): Admitting: Family Medicine

## 2024-01-05 VITALS — BP 129/75 | HR 68 | Ht 62.0 in | Wt 120.0 lb

## 2024-01-05 DIAGNOSIS — C182 Malignant neoplasm of ascending colon: Secondary | ICD-10-CM | POA: Diagnosis not present

## 2024-01-05 DIAGNOSIS — Z7689 Persons encountering health services in other specified circumstances: Secondary | ICD-10-CM

## 2024-01-05 DIAGNOSIS — E119 Type 2 diabetes mellitus without complications: Secondary | ICD-10-CM | POA: Diagnosis not present

## 2024-01-05 DIAGNOSIS — R5382 Chronic fatigue, unspecified: Secondary | ICD-10-CM | POA: Diagnosis not present

## 2024-01-05 DIAGNOSIS — D649 Anemia, unspecified: Secondary | ICD-10-CM

## 2024-01-05 MED ORDER — CARBAMAZEPINE ER 100 MG PO CP12: 100.0000 mg | ORAL_CAPSULE | Freq: Every day | ORAL | 0 refills | Status: DC | PRN

## 2024-01-05 MED ORDER — GABAPENTIN 300 MG PO CAPS: 300.0000 mg | ORAL_CAPSULE | ORAL | 3 refills | Status: DC | PRN

## 2024-01-05 NOTE — Progress Notes (Unsigned)
 New Patient Office Visit  Subjective:   Debra Fry December 18, 1940 01/05/2024  Chief Complaint  Patient presents with   New Patient (Initial Visit)    Patient is here today to get established with the practice. Pt has been sleeping more recently than usual and also sometimes has pain in her legs.    Discussed the use of AI scribe software for clinical note transcription with the patient, who gave verbal consent to proceed.  History of Present Illness     HPI: Debra Fry presents today to establish care at [facility] at [facility]. Introduced to publishing rights manager role and practice setting.  All questions answered. She is accompanied by her daughter Rexene.     Patient has hx of malignancy of colon that is currently in remission with recent labs performed in October for CEA and hx of anemia. Labs reviewed by PCP.   She states she feels like she would like to stay in the bed when she is not working. She does not currently take her B12 or Vitamin D3 currently and feels decreased motivation with getting out of bed. She does use gabapentin  and tegretol  in the AM as needed for trigeminal neuralgia but denies drowsiness when taking.    DIABETES MELLITUS: Debra Fry presents for the medical management of diabetes.  Current diabetes medication regimen: Diet, Exercise  Patient is  adhering to a diabetic diet.  Patient is  exercising regularly.  Patient is not checking BS regularly.  Patient is  checking their feet regularly.  Denies polydipsia, polyphagia, polyuria, open wounds or ulcers on feet.   Lab Results  Component Value Date   HGBA1C 5.8 (H) 02/18/2023    Foot Exam: No foot exam found No results found for: LABMICR, MICROALBUR  Wt Readings from Last 3 Encounters:  01/05/24 120 lb (54.4 kg)  11/05/23 119 lb 0.8 oz (54 kg)  10/03/23 119 lb 1.6 oz (54 kg)      The following portions of the patient's history  were reviewed and updated as appropriate: past medical history, past surgical history, family history, social history, allergies, medications, and problem list.   Patient Active Problem List   Diagnosis Date Noted   Leucocytosis 02/16/2023   B12 deficiency 01/22/2023   ABLA (acute blood loss anemia) 01/22/2023   Iron deficiency anemia 01/22/2023   Symptomatic anemia 01/21/2023   Urinary tract infection, site not specified 09/21/2021   Vitamin D  deficiency 09/11/2021   Occipital neuralgia 07/30/2021   Adjustment disorder with anxiety 07/08/2021   Osteoarthritis of knee 07/08/2021   Gout 07/08/2021   Elevated blood-pressure reading without diagnosis of hypertension 06/11/2021   Hypothyroidism 08/02/2019   Type 2 diabetes mellitus without complications (HCC) 05/01/2019   Hyperglycemia 09/27/2018   Familial hyperlipidemia 03/27/2018   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) 02/13/2018   Acute CVA (cerebrovascular accident) (HCC) 01/27/2018   Past Medical History:  Diagnosis Date   Coronary artery disease    GERD (gastroesophageal reflux disease)    Headache    History of hiatal hernia    Hyperlipidemia    Myocardial infarction (HCC) 12/1999   Stroke [facility])    Past Surgical History:  Procedure Laterality Date   BIOPSY  01/23/2023   Procedure: BIOPSY;  Surgeon: Dianna Specking, MD;  Location: [facility];  Service: Gastroenterology;;   BIOPSY  02/18/2023   Procedure: BIOPSY;  Surgeon: Elicia Claw, MD;  Location: [facility];  Service:  Gastroenterology;;   CARDIAC CATHETERIZATION  2001   results in Echart conversion   COLONOSCOPY WITH PROPOFOL  N/A 02/18/2023   Procedure: COLONOSCOPY WITH PROPOFOL ;  Surgeon: Elicia Claw, MD;  Location: [facility];  Service: Gastroenterology;  Laterality: N/A;   CORONARY ARTERY BYPASS GRAFT  2001   ENDARTERECTOMY Left 02/13/2018   Procedure: ENDARTERECTOMY CAROTID;  Surgeon: Harvey Carlin BRAVO, MD;  [facility]  OR;  Service: Vascular;  Laterality: Left;   ESOPHAGOGASTRODUODENOSCOPY (EGD) WITH PROPOFOL  N/A 01/23/2023   Procedure: ESOPHAGOGASTRODUODENOSCOPY (EGD) WITH PROPOFOL ;  Surgeon: Dianna Specking, MD;  Location: [facility];  Service: Gastroenterology;  Laterality: N/A;   ESOPHAGOGASTRODUODENOSCOPY (EGD) WITH PROPOFOL  N/A 02/18/2023   Procedure: ESOPHAGOGASTRODUODENOSCOPY (EGD) WITH PROPOFOL ;  Surgeon: Elicia Claw, MD;  Location: [facility];  Service: Gastroenterology;  Laterality: N/A;   LAPAROSCOPIC PARTIAL RIGHT COLECTOMY Right 02/22/2023   Procedure: LAPAROSCOPIC PARTIAL RIGHT COLECTOMY;  Surgeon: Signe Mitzie LABOR, MD;  Location: [facility];  Service: General;  Laterality: Right;   OPEN REDUCTION INTERNAL FIXATION (ORIF) DISTAL RADIAL FRACTURE Left 11/23/2018   Procedure: OPEN REDUCTION INTERNAL FIXATION (ORIF) LEFT DISTAL RADIAL AND ULNA  FRACTURES;  Surgeon: Murrell Drivers, MD;  Location: [facility];  Service: Orthopedics;  Laterality: Left;   POLYPECTOMY  02/18/2023   Procedure: POLYPECTOMY;  Surgeon: Elicia Claw, MD;  Location: [facility];  Service: Gastroenterology;;   SUBMUCOSAL TATTOO INJECTION  02/18/2023   Procedure: SUBMUCOSAL TATTOO INJECTION;  Surgeon: Elicia Claw, MD;  Location: [facility];  Service: Gastroenterology;;   Family History  Family history unknown: Yes   Social History   Socioeconomic History   Marital status: Married    Spouse name: Not on file   Number of children: Not on file   Years of education: Not on file   Highest education level: Not on file  Occupational History   Not on file  Tobacco Use   Smoking status: Never   Smokeless tobacco: Never  Vaping Use   Vaping status: Never Used  Substance and Sexual Activity   Alcohol use: No   Drug use: No   Sexual activity: Not on file  Other Topics Concern   Not on file  Social History Narrative   Not on file   Social Drivers of Health   Tobacco Use: Low Risk  (01/05/2024)   Patient History    Smoking Tobacco Use: Never    Smokeless Tobacco Use: Never    Passive Exposure: Not on file  Financial Resource Strain: Not on file  Food Insecurity: No Food Insecurity (02/17/2023)   Hunger Vital Sign    Worried About Running Out of Food in the Last Year: Never [place]    Ran Out of Food in the Last Year: Never [place]  Transportation Needs: No Transportation Needs (02/17/2023)   PRAPARE - Administrator, Civil Service (Medical): No    Lack of Transportation (Non-Medical): No  Physical Activity: Not on file  Stress: Not on file  Social Connections: Moderately Integrated (02/17/2023)   Social Connection and Isolation Panel    Frequency of Communication with Friends and Family: Three times a week    Frequency of Social Gatherings with Friends and Family: Once a week    Attends Religious Services: More than 4 times per year    Active Member of [employer] or Organizations: Yes    Attends Banker Meetings: More than 4 times per year    Marital Status: Widowed  Intimate Partner Violence: Not At Risk (02/17/2023)  Humiliation, Afraid, Rape, and Kick questionnaire    Fear of Current or Ex-Partner: No    Emotionally Abused: No    Physically Abused: No    Sexually Abused: No  Depression (PHQ2-9): Medium Risk (01/05/2024)   Depression (PHQ2-9)    PHQ-2 Score: 6  Alcohol Screen: Not on file  Housing: Unknown (03/07/2023)   Received from [facility]   Epic    Unable to Pay for Housing in the Last Year: Not on file    Number of Times Moved in the Last Year: Not on file    At any time in the past 12 months, were you homeless or living in a shelter (including now)?: No  Utilities: Not At Risk (02/17/2023)   [facility] Utilities    Threatened with loss of utilities: No  Health Literacy: Not on file   Outpatient Medications Prior to Visit  Medication Sig Dispense Refill   acetaminophen  (TYLENOL ) 500 MG tablet Take 2 tablets (1,000 mg  total) by mouth every 6 (six) hours as needed.     carbamazepine  (CARBATROL ) 100 MG 12 hr capsule Take 100 mg by mouth daily as needed (acute TN flare ups).     gabapentin  (NEURONTIN ) 300 MG capsule Take 1 capsule (300 mg total) by mouth 3 (three) times daily as needed. Future refills per neurology please 90 capsule 0   calcium  carbonate (TUMS - DOSED IN MG ELEMENTAL CALCIUM ) 500 MG chewable tablet Chew 1 tablet by mouth daily as needed for indigestion or heartburn.     cholecalciferol  (CHOLECALCIFEROL ) 25 MCG tablet Take 2 tablets (2,000 Units total) by mouth daily. (Patient not taking: Reported on 10/03/2023) 60 tablet 0   cyanocobalamin  (VITAMIN B12) 1000 MCG tablet Take 1 tablet (1,000 mcg total) by mouth daily. (Patient not taking: Reported on 10/03/2023) 30 tablet 2   ferrous sulfate  325 (65 FE) MG tablet Take 1 tablet (325 mg total) by mouth daily with breakfast. (Patient not taking: Reported on 10/03/2023) 30 tablet 3   oxyCODONE  (OXY IR/ROXICODONE ) 5 MG immediate release tablet Take 1 tablet (5 mg total) by mouth every 6 (six) hours as needed (pain). (Patient not taking: Reported on 10/03/2023) 20 tablet 0   pantoprazole  (PROTONIX ) 40 MG tablet Take 1 tablet (40 mg total) by mouth 2 (two) times daily before a meal. GI recommending Protonix  twice a day for 3 months then once a day (Patient not taking: Reported on 01/05/2024) 60 tablet 2   trimethoprim -polymyxin b  (POLYTRIM ) ophthalmic solution Place 1 drop into the right eye every 4 (four) hours. 10 mL 0   No facility-administered medications prior to visit.   Allergies[1]  ROS: A complete ROS was performed with pertinent positives/negatives noted in the HPI. The remainder of the ROS are negative.   Objective:   Today's Vitals   01/05/24 1512  BP: 129/75  Pulse: 68  SpO2: 96%  Weight: 120 lb (54.4 kg)  Height: 5' 2 (1.575 m)    GENERAL: Well-appearing, in NAD. Well nourished.  SKIN: Pink, warm and dry.  Head: Normocephalic. NECK:  Trachea midline. Full ROM w/o pain or tenderness.  RESPIRATORY: Chest wall symmetrical. Respirations even and non-labored. Breath sounds clear to auscultation bilaterally.  CARDIAC: S1, S2 present, regular rate and rhythm without murmur or gallops. Peripheral pulses 2+ bilaterally.  MSK: Muscle tone and strength appropriate for age.  NEUROLOGIC: No motor or sensory deficits. Steady, even gait. C2-C12 intact.  PSYCH/MENTAL STATUS: Alert, oriented x 3. Cooperative, appropriate mood and affect.  Assessment & Plan:  1. Encounter to establish care with new doctor (Primary) Reviewed patient's recent office visits, medical, surgical and family history.   2. Type 2 diabetes mellitus without complication, without long-term current use of insulin (HCC) Controlled with diet. Will check A1C with upcoming physical.    3. Malignant neoplasm of ascending colon (HCC) Currently in remission. Will continue with Oncology management.   4. Chronic fatigue Discussed causes of fatigue with vitamin/nutrient deficiencies, anxiety/depression. PCP recommended further evaluation but patient declined today. Will restart her Vit B12 and D3 and reach out to PCP if no improvement.    Patient to reach out to office if new, worrisome, or unresolved symptoms arise or if no improvement in patient's condition. Patient verbalized understanding and is agreeable to treatment plan. All questions answered to patient's satisfaction.   Return in about 4 months (around 05/05/2024) for ANNUAL PHYSICAL , DM follow up (fasting labs at visit) .    Thersia Schuyler Stark, FNP     [1]  Allergies Allergen Reactions   Atorvastatin      Knee pain

## 2024-02-14 ENCOUNTER — Ambulatory Visit: Admitting: Neurology

## 2024-02-14 ENCOUNTER — Encounter: Payer: Self-pay | Admitting: Neurology

## 2024-02-14 VITALS — BP 145/66 | HR 67 | Ht 62.0 in | Wt 124.2 lb

## 2024-02-14 DIAGNOSIS — G500 Trigeminal neuralgia: Secondary | ICD-10-CM | POA: Diagnosis not present

## 2024-02-14 DIAGNOSIS — Z8673 Personal history of transient ischemic attack (TIA), and cerebral infarction without residual deficits: Secondary | ICD-10-CM | POA: Diagnosis not present

## 2024-02-14 MED ORDER — GABAPENTIN 300 MG PO CAPS: 300.0000 mg | ORAL_CAPSULE | ORAL | 3 refills | Status: DC | PRN

## 2024-02-14 MED ORDER — ASPIRIN 81 MG PO TBEC: 81.0000 mg | DELAYED_RELEASE_TABLET | Freq: Every day | ORAL | 12 refills | Status: AC

## 2024-02-14 MED ORDER — CARBAMAZEPINE ER 100 MG PO CP12: 100.0000 mg | ORAL_CAPSULE | Freq: Every day | ORAL | 0 refills | Status: AC | PRN

## 2024-02-14 NOTE — Patient Instructions (Addendum)
 I had a long discussion with the patient regarding her trigeminal neuralgia which appears to be quite stable and well-controlled on the current medication regimen recommend she continue Carbatrol  100 mg as needed and Gabapentin  300 mg  I recommend she start taking aspirin  81 mg daily for secondary stroke prevention.Aggressive risk  factor modification for control of hypertension blood pressure goal below 140/90, lipids with LDL cholesterol goal 70 mg percent and diabetes check lipid profile, hemoglobin A1c and screening carotid ultrasound return for follow-up in the future in 6 months with my nurse practitioner Harlene or call earlier if needed.  Trigeminal Neuralgia  Trigeminal neuralgia is a nerve disorder that causes severe pain on one side of the face. The pain may last from a few seconds to several minutes, but it can happen hundreds of times a day. The pain is usually only on one side of the face. Symptoms may occur for days, weeks, or months and then go away for months or years. The pain may return and be worse than before. What are the causes? This condition may be caused by: Damage or pressure to a nerve in the head that is called the trigeminal nerve. An attack can be triggered by: Talking or chewing. Putting on makeup. Washing, shaving, or touching your face. Brushing your teeth. Blasts of hot or cold air. Primary demyelinating disorders, such as multiple sclerosis. Tumors. What increases the risk? You are more likely to develop this condition if: You are 64-50 years old. You are female. What are the signs or symptoms? The main symptom of this condition is severe pain in the jaw, lips, eyes, nose, scalp, forehead, and face. How is this diagnosed? This condition is diagnosed with a physical exam. A CT scan or an MRI may be done to rule out other conditions that can cause facial pain. How is this treated? This condition may be treated with: Measures to avoid the things that trigger  your symptoms. Prescription medicines such as anticonvulsants. Procedures such as ablation, thermal, or radiation therapy. Cognitive or behavioral therapy. Complementary therapies such as: Gentle, regular exercise or yoga. Meditation. Aromatherapy. Acupuncture. Surgery. This may be done in severe cases if other medical treatment does not provide relief. It may take up to one month for treatment to start relieving the pain. Follow these instructions at home: Managing pain  Learn as much as you can about how to manage your pain. Ask your health care provider if a pain specialist would be helpful. Consider talking with a mental health care provider about how to cope with the pain. Consider joining a pain support group. General instructions Take over-the-counter and prescription medicines only as told by your health care provider. Avoid the things that trigger your symptoms. It may help to: Chew on the unaffected side of your mouth. Avoid touching your face. Avoid blasts of hot or cold air. Keep all follow-up visits. Where to find more information Facial Pain Association: facepain.org Contact a health care provider if: Your medicine is not helping your symptoms. You have side effects from the medicine used for treatment. You develop new, unexplained symptoms, such as: Double vision. Facial weakness or numbness. Changes in hearing or balance. You feel depressed. Get help right away if: Your pain is severe and is not getting better. You develop suicidal thoughts. If you ever feel like you may hurt yourself or others, or have thoughts about taking your own life, get help right away. Go to your nearest emergency department or: Call your local  emergency services (911 in the [place] Call a suicide crisis helpline, such as the National Suicide Prevention Lifeline at 763-880-0087 or 988 in the [place] This is open 24 hours a day in the [place] If youre a Veteran: Call 988 and press 1. This  is open 24 hours a day. Text the [employer] at 682-099-6424. Summary Trigeminal neuralgia is a nerve disorder that causes severe pain on one side of the face. The pain may last from a few seconds to several minutes. This condition is caused by damage or pressure to a nerve in the head that is called the trigeminal nerve. Treatment may include avoiding the things that trigger your symptoms, taking medicines, or having procedures or surgery. It may take up to one month for treatment to start relieving the pain. Keep all follow-up visits. This information is not intended to replace advice given to you by your health care provider. Make sure you discuss any questions you have with your health care provider. Document Revised: 08/26/2022 Document Reviewed: 07/07/2020 Elsevier Patient Education  2025 [employer].

## 2024-02-14 NOTE — Progress Notes (Signed)
 " Debra Fry [address] [place]. [place] [address] [phone]       OFFICE CONSULT NOTE  [name] Date of Birth:  Oct 28, 1940 Medical Record Number:  [id number]   Referring MD: Arley Hof  Reason for Referral: History of.  He is and trigeminal neuralgia  HPI: Debra Fry is a pleasant 84 year old Caucasian lady seen today for office consultation visit.  History is obtained from the patient and review of electronic medical records.  I personally reviewed pertinent available imaging films in PACS.  She has past medical history of hyperlipidemia, myocardial infarction, gastroesophageal flux disease, coronary artery disease, left MCA branch infarct in 2020 as well as trigeminal neuralgia.  Patient has been seen previously for these problems in our office with the last office visit with Harlene nurse practitioner being on 09/26/2019.  She is referred back to our office for refills for her medications as she has not been seen in our office for more than 4 years.  Continues to have intermittent flareups of cardiology nausea but this has been quite mild.  Last episode when she had severe pain to her head was more than a year ago.  She had last ED mild episode of right sided gum pain about 5 months ago.  She takes carbamazepine  100 mg gabapentin  300 mg for symptomatic relief seems to work quite well.  Has had no pain in the last. History of embolic left perisylvian branch infarct in January 2020 secondary to symptomatic left carotid stenosis for which she underwent left carotid endarterectomy by Dr. Harvey.: She was supposed to be on aspirin  and Crestor  but has stopped those medications as she has not had any recurrent stroke or 6 years now.  She has not had any follow-up with vascular surgery as well for her left carotid endarterectomy.  She has not had any recent lab work for lipid and A1c carotid ultrasound done Prior office visit 09/26/2019 : Young, NP ) Debra Fry returns  per patient request due to flareup of trigeminal neuralgia Initially seen in office for stroke follow-up previously followed by [facility] neurology for [place] but they refused further follow-up visits as she was seeing our office for stroke therefore we took over her care   She called office on 8/30 due to flareup of trigeminal neuralgia starting on 8/27 previously stable on gabapentin  600 mg 3 times daily as needed Apparently, she self increased dose to 1200 mg 3 times daily due to severe pain Carbamazepine  100 mg twice daily initiated by prior to being able to start medication, she presented to ED yesterday (8/31) due to continued severe right-sided headache similar to trigeminal neuralgia pain. CT head obtained negative for acute abnormality.  Complains of nausea with vomiting receiving Compazine  and Benadryl .  She was discharged back home after receiving carbamazepine  dosage.   Reports she woke up this morning and had complete resolution of trigeminal nerve pain She did take carbamazepine  this morning as well as gabapentin  dosage So far, tolerating carbamazepine  without side effects Previously on oxcarbazepine  and baclofen  by prior neurology office but unable to tolerate   Stable from stroke standpoint Remains on aspirin  81 mg daily without bleeding or bruising Previously on Crestor  and Zetia  but ran out of prescription recently -declined side effects while taking Blood pressure today elevated but monitors at home which has been stable   No further concerns ROS:   14 system review of systems is positive for facial pain, abdominal pain all other systems negative  PMH:  Past Medical History:  Diagnosis Date   Coronary artery disease    GERD (gastroesophageal reflux disease)    Headache    History of hiatal hernia    Hyperlipidemia    Myocardial infarction (HCC) 12/1999   Stroke [facility])     Social History:  Social History   Socioeconomic History   Marital status: Married    Spouse  name: Not on file   Number of children: Not on file   Years of education: Not on file   Highest education level: Not on file  Occupational History   Not on file  Tobacco Use   Smoking status: Never   Smokeless tobacco: Never  Vaping Use   Vaping status: Never Used  Substance and Sexual Activity   Alcohol use: No   Drug use: No   Sexual activity: Not on file  Other Topics Concern   Not on file  Social History Narrative   Not on file   Social Drivers of Health   Tobacco Use: Low Risk (02/14/2024)   Patient History    Smoking Tobacco Use: Never    Smokeless Tobacco Use: Never    Passive Exposure: Not on file  Financial Resource Strain: Not on file  Food Insecurity: No Food Insecurity (02/17/2023)   Hunger Vital Sign    Worried About Running Out of Food in the Last Year: Never Blizzard    Ran Out of Food in the Last Year: Never [place]  Transportation Needs: No Transportation Needs (02/17/2023)   PRAPARE - Administrator, Civil Service (Medical): No    Lack of Transportation (Non-Medical): No  Physical Activity: Not on file  Stress: Not on file  Social Connections: Moderately Integrated (02/17/2023)   Social Connection and Isolation Panel    Frequency of Communication with Friends and Family: Three times a week    Frequency of Social Gatherings with Friends and Family: Once a week    Attends Religious Services: More than 4 times per year    Active Member of [employer] or Organizations: Yes    Attends Banker Meetings: More than 4 times per year    Marital Status: Widowed  Intimate Partner Violence: Not At Risk (02/17/2023)   Humiliation, Afraid, Rape, and Kick questionnaire    Fear of Current or Ex-Partner: No    Emotionally Abused: No    Physically Abused: No    Sexually Abused: No  Depression (PHQ2-9): Medium Risk (01/05/2024)   Depression (PHQ2-9)    PHQ-2 Score: 6  Alcohol Screen: Not on file  Housing: Unknown (03/07/2023)   [facility]   Epic    Unable to Pay for Housing in the Last Year: Not on file    Number of Times Moved in the Last Year: Not on file    At any time in the past 12 months, were you homeless or living in a shelter (including now)?: No  Utilities: Not At Risk (02/17/2023)   [facility] Utilities    Threatened with loss of utilities: No  Health Literacy: Not on file    Medications:  Medications Ordered Prior to Encounter[1]  Allergies:  Allergies[2]  Physical Exam General: well developed, well nourished, seated, in no evident distress Head: head normocephalic and atraumatic.   Neck: supple with no carotid or supraclavicular bruits Cardiovascular: regular rate and rhythm, no murmurs Musculoskeletal: no deformity Skin:  no rash/petichiae Vascular:  Normal pulses all extremities  Neurologic Exam Mental Status: Awake and fully  alert. Oriented to place and time. Recent and remote memory intact. Attention span, concentration and fund of knowledge appropriate. Mood and affect appropriate.  Cranial Nerves: Fundoscopic exam reveals sharp disc margins. Pupils equal, briskly reactive to light. Extraocular movements full without nystagmus. Visual fields full to confrontation. Hearing intact. Facial sensation intact. Face, tongue, palate moves normally and symmetrically.  Motor: Normal bulk and tone. Normal strength in all tested extremity muscles. Sensory.: intact to touch , pinprick , position and vibratory sensation.  Coordination: Rapid alternating movements normal in all extremities. Finger-to-nose and heel-to-shin performed accurately bilaterally. Gait and Station: Arises from chair without difficulty. Stance is normal. Gait demonstrates normal stride length and balance . Able to heel, toe and tandem walk without difficulty.  Reflexes: 1+ and symmetric. Toes downgoing.   NIHSS  0 Modified Rankin  0   ASSESSMENT: 84 year old Caucasian lady with longstanding history of paroxysmal appears to be in  remission and controlled on the current medication regimen I will continue carbamazepine .  Remote history of left MCA branch infarct in 2020 from symptomatic left carotid stenosis for which she had undergone surgery..  She is doing well from neurovascular standpoint as well.     PLAN:I had a long discussion with the patient regarding her trigeminal neuralgia which appears to be quite stable and well-controlled on the current medication regimen recommend she continue Carbatrol  100 mg as needed and Gabapentin  300 mg  I recommend she start taking aspirin  81 mg daily for secondary stroke prevention.Aggressive risk  factor modification for control of hypertension blood pressure goal below 140/90, lipids with LDL cholesterol goal 70 mg percent and diabetes check lipid profile, hemoglobin A1c and screening carotid ultrasound return for follow-up in the future in 6 months with my nurse practitioner Harlene or call earlier if needed.   I personally spent a total of 50 minutes in the care of the patient today including getting/reviewing separately obtained history, performing a medically appropriate exam/evaluation, counseling and educating, placing orders, referring and communicating with other health care professionals, documenting clinical information in the EHR, independently interpreting results, and coordinating care.       Eather Popp, MD  Note: This document was prepared with digital dictation and possible smart phrase technology. Any transcriptional errors that result from this process are unintentional.      [1]  Current Outpatient Medications on File Prior to Visit  Medication Sig Dispense Refill   acetaminophen  (TYLENOL ) 500 MG tablet Take 2 tablets (1,000 mg total) by mouth every 6 (six) hours as needed.     carbamazepine  (CARBATROL ) 100 MG 12 hr capsule Take 1 capsule (100 mg total) by mouth daily as needed (acute [place] flare ups). 30 capsule 0   gabapentin  (NEURONTIN ) 300 MG capsule Take 1  capsule (300 mg total) by mouth as needed (trigeminal neuralgia). Future refills per neurology please 60 capsule 3   No current facility-administered medications on file prior to visit.  [2]  Allergies Allergen Reactions   Atorvastatin      Knee pain   "

## 2024-02-15 LAB — LIPID PANEL
Chol/HDL Ratio: 4.2 ratio (ref 0.0–4.4)
Cholesterol, Total: 240 mg/dL — ABNORMAL HIGH (ref 100–199)
HDL: 57 mg/dL
LDL Chol Calc (NIH): 157 mg/dL — ABNORMAL HIGH (ref 0–99)
Triglycerides: 143 mg/dL (ref 0–149)
VLDL Cholesterol Cal: 26 mg/dL (ref 5–40)

## 2024-02-15 LAB — HEMOGLOBIN A1C
Est. average glucose Bld gHb Est-mCnc: 128 mg/dL
Hgb A1c MFr Bld: 6.1 % — ABNORMAL HIGH (ref 4.8–5.6)

## 2024-02-18 ENCOUNTER — Ambulatory Visit: Payer: Self-pay | Admitting: Neurology

## 2024-02-22 ENCOUNTER — Telehealth: Payer: Self-pay | Admitting: *Deleted

## 2024-02-22 NOTE — Telephone Encounter (Signed)
" ° °  Dr.Sethi please see the message from the pharmacy. I assume you want patient 1 tablet twice daily? You prescribed #60 tablets. Please advise  "

## 2024-02-23 MED ORDER — GABAPENTIN 300 MG PO CAPS: 300.0000 mg | ORAL_CAPSULE | Freq: Three times a day (TID) | ORAL | 3 refills | Status: AC | PRN

## 2024-02-23 NOTE — Addendum Note (Signed)
 Addended by: DOUGLASS DELON CROME on: 02/23/2024 01:20 PM   Modules accepted: Orders

## 2024-02-23 NOTE — Telephone Encounter (Signed)
 Dr.Sethi response, Rx sent

## 2024-04-02 ENCOUNTER — Ambulatory Visit: Admitting: Oncology

## 2024-04-02 ENCOUNTER — Other Ambulatory Visit

## 2024-05-08 ENCOUNTER — Encounter (HOSPITAL_BASED_OUTPATIENT_CLINIC_OR_DEPARTMENT_OTHER): Admitting: Family Medicine

## 2024-05-22 ENCOUNTER — Encounter (HOSPITAL_BASED_OUTPATIENT_CLINIC_OR_DEPARTMENT_OTHER): Admitting: Family Medicine

## 2024-09-04 ENCOUNTER — Ambulatory Visit: Admitting: Adult Health
